# Patient Record
Sex: Female | Born: 1940 | Race: White | Hispanic: No | Marital: Married | Smoking: Never smoker
Health system: Southern US, Community
[De-identification: ages and names within clinical notes are randomized; demographics above are authoritative.]

## PROBLEM LIST (undated history)

## (undated) DIAGNOSIS — Z8719 Personal history of other diseases of the digestive system: Secondary | ICD-10-CM

## (undated) DIAGNOSIS — K219 Gastro-esophageal reflux disease without esophagitis: Secondary | ICD-10-CM

## (undated) DIAGNOSIS — C801 Malignant (primary) neoplasm, unspecified: Secondary | ICD-10-CM

## (undated) DIAGNOSIS — N63 Unspecified lump in unspecified breast: Secondary | ICD-10-CM

## (undated) DIAGNOSIS — I2699 Other pulmonary embolism without acute cor pulmonale: Secondary | ICD-10-CM

## (undated) DIAGNOSIS — G629 Polyneuropathy, unspecified: Secondary | ICD-10-CM

## (undated) DIAGNOSIS — N644 Mastodynia: Secondary | ICD-10-CM

## (undated) HISTORY — PX: COLONOSCOPY W/ POLYPECTOMY: SHX1380

## (undated) HISTORY — PX: TONSILLECTOMY: SUR1361

## (undated) HISTORY — PX: BREAST SURGERY: SHX581

## (undated) HISTORY — PX: MASTECTOMY: SHX3

## (undated) HISTORY — DX: Unspecified lump in unspecified breast: N63.0

## (undated) HISTORY — DX: Mastodynia: N64.4

---

## 2000-05-25 ENCOUNTER — Encounter: Payer: Self-pay | Admitting: Emergency Medicine

## 2000-05-25 ENCOUNTER — Emergency Department (HOSPITAL_COMMUNITY): Admission: EM | Admit: 2000-05-25 | Discharge: 2000-05-25 | Payer: Self-pay | Admitting: Emergency Medicine

## 2002-10-18 ENCOUNTER — Ambulatory Visit (HOSPITAL_COMMUNITY): Admission: RE | Admit: 2002-10-18 | Discharge: 2002-10-18 | Payer: Self-pay | Admitting: Family Medicine

## 2002-10-18 ENCOUNTER — Encounter: Payer: Self-pay | Admitting: Family Medicine

## 2002-10-25 ENCOUNTER — Encounter: Payer: Self-pay | Admitting: Obstetrics and Gynecology

## 2002-10-25 ENCOUNTER — Encounter: Admission: RE | Admit: 2002-10-25 | Discharge: 2002-10-25 | Payer: Self-pay | Admitting: Obstetrics and Gynecology

## 2003-02-20 ENCOUNTER — Emergency Department (HOSPITAL_COMMUNITY): Admission: EM | Admit: 2003-02-20 | Discharge: 2003-02-21 | Payer: Self-pay | Admitting: Emergency Medicine

## 2005-04-03 ENCOUNTER — Emergency Department (HOSPITAL_COMMUNITY): Admission: EM | Admit: 2005-04-03 | Discharge: 2005-04-03 | Payer: Self-pay | Admitting: Family Medicine

## 2009-03-16 HISTORY — PX: SKIN CANCER EXCISION: SHX779

## 2009-03-16 HISTORY — PX: OTHER SURGICAL HISTORY: SHX169

## 2009-10-31 ENCOUNTER — Encounter: Admission: RE | Admit: 2009-10-31 | Discharge: 2009-10-31 | Payer: Self-pay | Admitting: Family Medicine

## 2009-12-13 ENCOUNTER — Other Ambulatory Visit: Admission: RE | Admit: 2009-12-13 | Discharge: 2009-12-13 | Payer: Self-pay | Admitting: Family Medicine

## 2010-07-01 ENCOUNTER — Ambulatory Visit (HOSPITAL_COMMUNITY)
Admission: RE | Admit: 2010-07-01 | Discharge: 2010-07-01 | Disposition: A | Discharge status: Home / Self Care | Payer: Medicare Other | Source: Ambulatory Visit | Attending: Gastroenterology | Admitting: Gastroenterology

## 2010-07-01 ENCOUNTER — Other Ambulatory Visit: Payer: Self-pay | Admitting: Gastroenterology

## 2010-07-01 DIAGNOSIS — K219 Gastro-esophageal reflux disease without esophagitis: Secondary | ICD-10-CM | POA: Insufficient documentation

## 2010-07-01 DIAGNOSIS — K648 Other hemorrhoids: Secondary | ICD-10-CM | POA: Insufficient documentation

## 2010-07-01 DIAGNOSIS — J45909 Unspecified asthma, uncomplicated: Secondary | ICD-10-CM | POA: Insufficient documentation

## 2010-07-01 DIAGNOSIS — D126 Benign neoplasm of colon, unspecified: Secondary | ICD-10-CM | POA: Insufficient documentation

## 2010-07-06 NOTE — Op Note (Signed)
  NAME:  [name] [name], [name] [name] NO.:  [id number]  MEDICAL RECORD NO.:  [id number]           PATIENT TYPE:  O  LOCATION:  [facility]                         FACILITY:  [facility]  PHYSICIAN:  Shirley Friar, MDDATE OF BIRTH:  08/10/1940  DATE OF PROCEDURE: DATE OF DISCHARGE:                              OPERATIVE REPORT   PROCEDURE:  Colonoscopy.  INDICATION:  History of polyps.  The patient had a colonoscopy in December 2011 and a flat polyp was seen in the splenic flexure that was biopsied.  This surveillance colonoscopy is being done to reevaluate the site of this polyp, which was tattooed for further removal and eradication.  MEDICATIONS: 1. Fentanyl 100 mcg IV. 2. Versed 10 mg IV. 3. [place] ink spot 3 cc submucosal injection. 4. Saline injection 6 cc.  FINDINGS:  Rectal exam was unremarkable.  The Pediatric colonoscope was inserted into a well prepped colon and on insertion were several flat polyps noted in the descending colon, splenic flexure, and transverse colon.  On insertion, a 6 mm flat polyp was removed with snare cautery in the descending colon.  The colonoscope was then advanced to the cecum and ileocecal valve and appendiceal orifice were identified.  On careful withdrawal of the colonoscope, a 1.2 cm carpet-like and flat polyp was seen in the distal transverse colon.  6 cc of saline was injected submucosally with attempt to raise the polyp.  It did not raise adequately and snare cautery was not possible due to the flat nature of the polyp.  Two biopsies were taken of the raised polyp with most of the polyp being removed on these biopsies.  A small portion of the rim of the polyp was still present and Argon plasma coagulation was used to fulgurate the remaining part of the polyp.  [place] ink spot was injected near this polyp for tattooing.  A 2 mm flat polyp was seen near this polyp and that was removed with cold biopsy.  Approximately 2 folds distal to  this area was a 4 mm flat polyp that was removed with cold biopsy.  In the splenic flexure, the previously noted flat polyp was again seen correlating with the last colonoscopy and ink was noted from previous tattooing.  Snare cautery was attempted on this flat polyp without success and then the area was fulgurated with Argon plasma coagulation.  On further withdrawal back into the descending colon, a 6 mm flat polyp was seen and removed with snare cautery.  The colonoscope was then further withdrawn and retroflexion was done, which revealed small internal hemorrhoids.  ASSESSMENT: 1. Five flat polyps removed with various techniques as stated in     detail above. 2. Internal hemorrhoids.  FOLLOWUP: 1. Follow up path. 2. No aspirin products for 2 weeks.     Shirley Friar, MD     VCS/MEDQ  D:  07/01/2010  T:  07/01/2010  Job:  [id number]  cc:   Pam Drown, M.D. Fax: [phone]  Electronically Signed by Charlott Rakes MD on 07/06/2010 12:37:06 PM

## 2010-09-29 ENCOUNTER — Other Ambulatory Visit: Payer: Self-pay | Admitting: Family Medicine

## 2010-09-29 DIAGNOSIS — N6489 Other specified disorders of breast: Secondary | ICD-10-CM

## 2010-09-29 DIAGNOSIS — N6310 Unspecified lump in the right breast, unspecified quadrant: Secondary | ICD-10-CM

## 2010-09-29 DIAGNOSIS — Z1231 Encounter for screening mammogram for malignant neoplasm of breast: Secondary | ICD-10-CM

## 2010-10-02 ENCOUNTER — Ambulatory Visit
Admission: RE | Admit: 2010-10-02 | Discharge: 2010-10-02 | Disposition: A | Discharge status: Home / Self Care | Payer: Medicare Other | Source: Ambulatory Visit | Attending: Family Medicine | Admitting: Family Medicine

## 2010-10-02 ENCOUNTER — Other Ambulatory Visit: Payer: Self-pay | Admitting: Family Medicine

## 2010-10-02 DIAGNOSIS — N6310 Unspecified lump in the right breast, unspecified quadrant: Secondary | ICD-10-CM

## 2010-10-07 ENCOUNTER — Other Ambulatory Visit: Payer: Self-pay | Admitting: Family Medicine

## 2010-10-07 ENCOUNTER — Ambulatory Visit
Admission: RE | Admit: 2010-10-07 | Discharge: 2010-10-07 | Disposition: A | Discharge status: Home / Self Care | Payer: Medicare Other | Source: Ambulatory Visit | Attending: Family Medicine | Admitting: Family Medicine

## 2010-10-07 ENCOUNTER — Other Ambulatory Visit: Payer: Self-pay | Admitting: Diagnostic Radiology

## 2010-10-07 DIAGNOSIS — Z1231 Encounter for screening mammogram for malignant neoplasm of breast: Secondary | ICD-10-CM

## 2010-10-07 DIAGNOSIS — N6310 Unspecified lump in the right breast, unspecified quadrant: Secondary | ICD-10-CM

## 2010-10-08 ENCOUNTER — Ambulatory Visit
Admission: RE | Admit: 2010-10-08 | Discharge: 2010-10-08 | Disposition: A | Discharge status: Home / Self Care | Payer: Medicare Other | Source: Ambulatory Visit | Attending: Family Medicine | Admitting: Family Medicine

## 2010-10-08 ENCOUNTER — Other Ambulatory Visit: Payer: Self-pay | Admitting: Family Medicine

## 2010-10-08 DIAGNOSIS — N6489 Other specified disorders of breast: Secondary | ICD-10-CM

## 2010-10-08 DIAGNOSIS — C50911 Malignant neoplasm of unspecified site of right female breast: Secondary | ICD-10-CM

## 2010-10-08 DIAGNOSIS — Z1231 Encounter for screening mammogram for malignant neoplasm of breast: Secondary | ICD-10-CM

## 2010-10-13 ENCOUNTER — Other Ambulatory Visit: Payer: Medicare Other

## 2010-10-15 ENCOUNTER — Other Ambulatory Visit: Payer: Self-pay | Admitting: Oncology

## 2010-10-15 ENCOUNTER — Encounter (HOSPITAL_BASED_OUTPATIENT_CLINIC_OR_DEPARTMENT_OTHER): Payer: Medicare Other | Admitting: Oncology

## 2010-10-15 ENCOUNTER — Ambulatory Visit (HOSPITAL_BASED_OUTPATIENT_CLINIC_OR_DEPARTMENT_OTHER): Payer: Medicare Other | Admitting: General Surgery

## 2010-10-15 ENCOUNTER — Encounter (INDEPENDENT_AMBULATORY_CARE_PROVIDER_SITE_OTHER): Payer: Self-pay | Admitting: General Surgery

## 2010-10-15 VITALS — BP 118/75 | HR 80 | Temp 98.4°F | Resp 18 | Ht 63.5 in | Wt 183.4 lb

## 2010-10-15 DIAGNOSIS — C50119 Malignant neoplasm of central portion of unspecified female breast: Secondary | ICD-10-CM

## 2010-10-15 DIAGNOSIS — C50919 Malignant neoplasm of unspecified site of unspecified female breast: Secondary | ICD-10-CM

## 2010-10-15 LAB — CBC WITH DIFFERENTIAL/PLATELET
BASO%: 0.4 % (ref 0.0–2.0)
HCT: 38.7 % (ref 34.8–46.6)
HGB: 12.9 g/dL (ref 11.6–15.9)
LYMPH%: 32.1 % (ref 14.0–49.7)
MCH: 29.5 pg (ref 25.1–34.0)
MCHC: 33.4 g/dL (ref 31.5–36.0)
MONO%: 5.9 % (ref 0.0–14.0)
NEUT#: 3.9 10*3/uL (ref 1.5–6.5)
RBC: 4.37 10*6/uL (ref 3.70–5.45)
WBC: 6.4 10*3/uL (ref 3.9–10.3)

## 2010-10-15 LAB — COMPREHENSIVE METABOLIC PANEL
ALT: 7 U/L (ref 0–35)
Alkaline Phosphatase: 95 U/L (ref 39–117)
Creatinine, Ser: 0.49 mg/dL — ABNORMAL LOW (ref 0.50–1.10)
Glucose, Bld: 124 mg/dL — ABNORMAL HIGH (ref 70–99)
Sodium: 141 mEq/L (ref 135–145)
Total Bilirubin: 0.5 mg/dL (ref 0.3–1.2)
Total Protein: 7 g/dL (ref 6.0–8.3)

## 2010-10-15 NOTE — Progress Notes (Addendum)
Subjective:     Patient ID: [name] [name], female   DOB: 17-Sep-1940, 70 y.o.   MRN: [id number]  HPI We are asked to see the patient in consultation by Dr. Anselmo Pickler to evaluate her for a right breast cancer.The patient is a 70 year old white female who has had a mass in the upper portion of her right breast for at least the past year. This was imaged last year and was felt to be benign. During the last year it has continued to grow. She does have some tenderness associated with it. She's had no discharge from her nipple. By ultrasound the mass measured at least 4.1 cm. 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 She denies any fevers or chills. No chest pain or shortness of breath. She does have intermittent diarrhea but she has seen a gastroenterologist for this.  Review of Systems  Constitutional: Negative.   HENT: Negative.   Eyes: Negative.   Respiratory: Negative.   Cardiovascular: Negative.   Gastrointestinal: Positive for diarrhea.  Genitourinary: Negative.   Musculoskeletal: Negative.   Skin: Negative.   Neurological: Negative.   Hematological: Negative.   Psychiatric/Behavioral: Negative.    Past Medical History  Diagnosis Date  . Asthma   . Breast pain in female     throbbing  . Abdominal pain   . Breast lump in female    Past Surgical History  Procedure Date  . Colonoscopy w/ polypectomy 12/11, 4/12  . Tonsillectomy   . Skin cancer excision 2011  . Bone density 2011   Current outpatient prescriptions:beclomethasone (QVAR) 40 MCG/ACT inhaler, Inhale 2 puffs into the lungs 2 (two) times daily.  , Disp: , Rfl: ;  GLUCOSAMINE-CHONDROITIN-MSM PO, Take by mouth daily.  , Disp: , Rfl: ;  levalbuterol (XOPENEX HFA) 45 MCG/ACT inhaler, Inhale 1-2 puffs into the lungs every 6 (six) hours as needed.  , Disp: , Rfl: ;  omeprazole (PRILOSEC) 20 MG capsule, Take 20 mg by mouth daily.  , Disp: , Rfl:   Allergies  Allergen Reactions  . Ibuprofen   .  Penicillins        Objective:   Physical Exam  Constitutional: She is oriented to person, place, and time. She appears well-developed and well-nourished.  HENT:  Head: Normocephalic and atraumatic.  Eyes: Conjunctivae and EOM are normal. Pupils are equal, round, and reactive to light.  Neck: Normal range of motion. Neck supple.  Cardiovascular: Normal rate, regular rhythm, normal heart sounds and intact distal pulses.   Pulmonary/Chest: Effort normal and breath sounds normal.       She has a large central mass in the right breast. It does appear to involve the skin. It does not appear to be tethered to the chest wall. It does feel a little larger than 4 cm.she has no palpable mass of the left breast. No axillary, supraclavicular or cervical lymphadenopathy on either side.  Abdominal: Soft. Bowel sounds are normal.  Musculoskeletal: Normal range of motion.  Neurological: She is oriented to person, place, and time.  Skin: Skin is warm and dry.  Psychiatric: She has a normal mood and affect. Her behavior is normal.       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    Plan:     Because of the size of the tumor I think she would be best served with a mastectomy. Although we don't know the exact type of tumor it is  I would recommend treating it like a breast malignancy in terms of evaluating the lymph nodes. I think she is a good candidate for sentinel node mapping.I have discussed her in detail the risks and benefits the operation as well as some of the technical aspects and she understands and wishes to proceed.

## 2010-10-16 ENCOUNTER — Other Ambulatory Visit (INDEPENDENT_AMBULATORY_CARE_PROVIDER_SITE_OTHER): Payer: Self-pay | Admitting: General Surgery

## 2010-10-16 DIAGNOSIS — C50911 Malignant neoplasm of unspecified site of right female breast: Secondary | ICD-10-CM

## 2010-10-17 ENCOUNTER — Ambulatory Visit (HOSPITAL_COMMUNITY)
Admission: RE | Admit: 2010-10-17 | Discharge: 2010-10-17 | Disposition: A | Discharge status: Home / Self Care | Payer: Medicare Other | Source: Ambulatory Visit | Attending: Oncology | Admitting: Oncology

## 2010-10-17 ENCOUNTER — Ambulatory Visit
Admission: RE | Admit: 2010-10-17 | Discharge: 2010-10-17 | Disposition: A | Discharge status: Home / Self Care | Payer: Medicare Other | Source: Ambulatory Visit | Attending: Family Medicine | Admitting: Family Medicine

## 2010-10-17 ENCOUNTER — Other Ambulatory Visit: Payer: Self-pay | Admitting: Oncology

## 2010-10-17 DIAGNOSIS — C50919 Malignant neoplasm of unspecified site of unspecified female breast: Secondary | ICD-10-CM | POA: Insufficient documentation

## 2010-10-17 DIAGNOSIS — C50911 Malignant neoplasm of unspecified site of right female breast: Secondary | ICD-10-CM

## 2010-10-17 DIAGNOSIS — M47814 Spondylosis without myelopathy or radiculopathy, thoracic region: Secondary | ICD-10-CM | POA: Insufficient documentation

## 2010-10-17 DIAGNOSIS — M954 Acquired deformity of chest and rib: Secondary | ICD-10-CM | POA: Insufficient documentation

## 2010-10-17 MED ORDER — GADOBENATE DIMEGLUMINE 529 MG/ML IV SOLN
17.0000 mL | Freq: Once | INTRAVENOUS | Status: AC | PRN
  Administered 2010-10-17: 17 mL via INTRAVENOUS

## 2010-10-30 ENCOUNTER — Telehealth (INDEPENDENT_AMBULATORY_CARE_PROVIDER_SITE_OTHER): Payer: Self-pay | Admitting: General Surgery

## 2010-11-03 ENCOUNTER — Other Ambulatory Visit (HOSPITAL_COMMUNITY): Payer: Medicare Other

## 2010-11-03 ENCOUNTER — Ambulatory Visit: Payer: Medicare Other

## 2010-11-12 ENCOUNTER — Other Ambulatory Visit (HOSPITAL_COMMUNITY): Payer: Medicare Other

## 2010-11-12 ENCOUNTER — Ambulatory Visit (HOSPITAL_COMMUNITY): Admission: RE | Admit: 2010-11-12 | Payer: Medicare Other | Source: Ambulatory Visit | Admitting: General Surgery

## 2011-02-28 ENCOUNTER — Other Ambulatory Visit: Payer: Self-pay

## 2011-02-28 ENCOUNTER — Emergency Department (HOSPITAL_COMMUNITY)
Admission: EM | Admit: 2011-02-28 | Discharge: 2011-03-01 | Disposition: A | Discharge status: Home / Self Care | Payer: Medicare Other | Attending: Emergency Medicine | Admitting: Emergency Medicine

## 2011-02-28 ENCOUNTER — Encounter (HOSPITAL_COMMUNITY): Payer: Self-pay | Admitting: Emergency Medicine

## 2011-02-28 DIAGNOSIS — C50919 Malignant neoplasm of unspecified site of unspecified female breast: Secondary | ICD-10-CM | POA: Insufficient documentation

## 2011-02-28 DIAGNOSIS — E860 Dehydration: Secondary | ICD-10-CM | POA: Insufficient documentation

## 2011-02-28 DIAGNOSIS — R55 Syncope and collapse: Secondary | ICD-10-CM

## 2011-02-28 DIAGNOSIS — E876 Hypokalemia: Secondary | ICD-10-CM | POA: Insufficient documentation

## 2011-02-28 DIAGNOSIS — D649 Anemia, unspecified: Secondary | ICD-10-CM | POA: Insufficient documentation

## 2011-02-28 DIAGNOSIS — Z79899 Other long term (current) drug therapy: Secondary | ICD-10-CM | POA: Insufficient documentation

## 2011-02-28 HISTORY — DX: Malignant (primary) neoplasm, unspecified: C80.1

## 2011-02-28 LAB — COMPREHENSIVE METABOLIC PANEL WITH GFR
ALT: 10 U/L (ref 0–35)
AST: 21 U/L (ref 0–37)
Albumin: 3.2 g/dL — ABNORMAL LOW (ref 3.5–5.2)
Alkaline Phosphatase: 125 U/L — ABNORMAL HIGH (ref 39–117)
BUN: 7 mg/dL (ref 6–23)
CO2: 29 meq/L (ref 19–32)
Calcium: 8.7 mg/dL (ref 8.4–10.5)
Chloride: 98 meq/L (ref 96–112)
Creatinine, Ser: 0.42 mg/dL — ABNORMAL LOW (ref 0.50–1.10)
GFR calc Af Amer: >90 mL/min
GFR calc non Af Amer: >90 mL/min
Glucose, Bld: 102 mg/dL — ABNORMAL HIGH (ref 70–99)
Potassium: 3.2 meq/L — ABNORMAL LOW (ref 3.5–5.1)
Sodium: 135 meq/L (ref 135–145)
Total Bilirubin: 0.5 mg/dL (ref 0.3–1.2)
Total Protein: 5.8 g/dL — ABNORMAL LOW (ref 6.0–8.3)

## 2011-02-28 LAB — CBC
HCT: 25.6 % — ABNORMAL LOW (ref 36.0–46.0)
Hemoglobin: 8.9 g/dL — ABNORMAL LOW (ref 12.0–15.0)
MCH: 30.4 pg (ref 26.0–34.0)
MCHC: 34.8 g/dL (ref 30.0–36.0)
MCV: 87.4 fL (ref 78.0–100.0)
Platelets: 206 K/uL (ref 150–400)
RBC: 2.93 MIL/uL — ABNORMAL LOW (ref 3.87–5.11)
RDW: 15.6 % — ABNORMAL HIGH (ref 11.5–15.5)
WBC: 30.1 K/uL — ABNORMAL HIGH (ref 4.0–10.5)

## 2011-02-28 LAB — DIFFERENTIAL
Basophils Absolute: 0.1 10*3/uL (ref 0.0–0.1)
Eosinophils Relative: 0 % (ref 0–5)
Lymphocytes Relative: 1 % — ABNORMAL LOW (ref 12–46)
Lymphs Abs: 0.4 10*3/uL — ABNORMAL LOW (ref 0.7–4.0)
Monocytes Absolute: 0.3 10*3/uL (ref 0.1–1.0)
Monocytes Relative: 1 % — ABNORMAL LOW (ref 3–12)

## 2011-02-28 LAB — POCT I-STAT, CHEM 8
Calcium, Ion: 1.1 mmol/L — ABNORMAL LOW (ref 1.12–1.32)
Glucose, Bld: 101 mg/dL — ABNORMAL HIGH (ref 70–99)
HCT: 27 % — ABNORMAL LOW (ref 36.0–46.0)
TCO2: 30 mmol/L (ref 0–100)

## 2011-02-28 LAB — POCT I-STAT TROPONIN I: Troponin i, poc: 0.02 ng/mL (ref 0.00–0.08)

## 2011-02-28 MED ORDER — POTASSIUM CHLORIDE ER 10 MEQ PO TBCR: 10.0000 meq | EXTENDED_RELEASE_TABLET | Freq: Two times a day (BID) | ORAL | Status: DC

## 2011-02-28 MED ORDER — SODIUM CHLORIDE 0.9 % IV BOLUS (SEPSIS)
1000.0000 mL | Freq: Once | INTRAVENOUS | Status: AC
  Administered 2011-02-28: 1000 mL via INTRAVENOUS

## 2011-02-28 MED ORDER — SODIUM CHLORIDE 0.9 % IV SOLN
INTRAVENOUS | Status: DC
  Administered 2011-02-28 – 2011-03-01 (×2): via INTRAVENOUS

## 2011-02-28 MED ORDER — PROMETHAZINE HCL 25 MG PO TABS: 25.0000 mg | ORAL_TABLET | Freq: Four times a day (QID) | ORAL | Status: AC | PRN

## 2011-02-28 MED ORDER — POTASSIUM CHLORIDE CRYS ER 20 MEQ PO TBCR
40.0000 meq | EXTENDED_RELEASE_TABLET | Freq: Once | ORAL | Status: AC
  Administered 2011-02-28: 40 meq via ORAL
  Filled 2011-02-28: qty 2

## 2011-02-28 MED ORDER — ACETAMINOPHEN 500 MG PO TABS
1000.0000 mg | ORAL_TABLET | Freq: Once | ORAL | Status: AC
  Administered 2011-02-28: 1000 mg via ORAL
  Filled 2011-02-28: qty 2

## 2011-02-28 MED ORDER — ONDANSETRON HCL 4 MG/2ML IJ SOLN
4.0000 mg | Freq: Once | INTRAMUSCULAR | Status: AC
  Administered 2011-02-28: 4 mg via INTRAVENOUS
  Filled 2011-02-28: qty 2

## 2011-02-28 NOTE — ED Provider Notes (Signed)
History     CSN: [id number] Arrival date & time: 02/28/2011  4:05 PM   First MD Initiated Contact with Patient 02/28/11 1627      Chief Complaint  Patient presents with  . Emesis    x2 today. started new chemo drug on thursday  . Near Syncope    (Consider location/radiation/quality/duration/timing/severity/associated sxs/prior treatment) HPI  Patient relates she has been diagnosed with breast cancer and she just had her fifth round of chemotherapy on December 12. She states that was the first day she received Taxol and she received Neulasta the following day which she's had before. She states last night she started having some myalgias and fatigue and having numbness in her hands. She states her hands got very red. She also had some diffuse abdominal discomfort and loss of appetite. She states her husband gave her activated charcoal and she felt better this morning she felt achy, she denies any chills. She states this morning her hands were intensely red and burning but they're better now. She had some low back discomfort and pain in her hips. She states about 10 AM she took some acetaminophen and that seemed to help.  She relates when she got up this morning she felt tired and she went to the kitchen to cook some aches however she started feeling her body was throbbing and she felt like she would faint. She relates she went back to her bedroom and sat down and her daughter was with her. She states she started feeling lightheaded like she passed out and she did have some loss of consciousness. Her daughter kept her sitting up and called her husband and when he came in the room she woke up and vomited. He states she was pale and sweaty. He states she was a little groggy but she her loss of consciousness was less than a minute. He states he kept her sitting up and she passed out again and vomited again when she came out of that. She had about 3 episodes of vomiting. She relates she's passed out in  the past when she was having chiropractic work done for back problems. She relates she's feeling much improved now. She gets her chemotherapy at [facility] with Dr. Wilford Grist and when she was seen on the 12th  her potassium was 3.1, her hemoglobin was 9.0 and her WBC count was 10,000.  Primary care [facility] Oncologist Dr Derl Barrow  Past Medical History  Diagnosis Date  . Asthma   . Breast pain in female     throbbing  . Abdominal pain   . Breast lump in female   . Cancer     rt    Past Surgical History  Procedure Date  . Colonoscopy w/ polypectomy 12/11, 4/12  . Tonsillectomy   . Skin cancer excision 2011  . Bone density 2011  . Mastectomy     right july 2012    No family history on file. Mother patient had atrial fibrillation  History  Substance Use Topics  . Smoking status: Never Smoker   . Smokeless tobacco: Not on file  . Alcohol Use: No   lives with husband   OB History    Grav Para Term Preterm Abortions TAB SAB Ect Mult Living                  Review of Systems  All other systems reviewed and are negative.    Allergies  Ibuprofen; Other; Penicillins; and Phenobarbital  Home Medications  Current Outpatient Rx  Name Route Sig Dispense Refill  . BECLOMETHASONE DIPROPIONATE 40 MCG/ACT IN AERS Inhalation Inhale 2 puffs into the lungs 2 (two) times daily.      Marland Kitchen DEXAMETHASONE 4 MG PO TABS Oral Take 20 mg by mouth 2 (two) times daily with a meal.      . OMEPRAZOLE 20 MG PO CPDR Oral Take 20 mg by mouth daily.      Marland Kitchen ONDANSETRON HCL 4 MG PO TABS Oral Take 4 mg by mouth every 8 (eight) hours as needed. nausea    . RANITIDINE HCL 150 MG PO TABS Oral Take 150 mg by mouth at bedtime.       BP 110/53  Pulse 100  Temp(Src) 98.7 F (37.1 C) (Oral)  Resp 18  SpO2 100% Vital signs show borderline tachycardia otherwise normal  Physical Exam  Nursing note and vitals reviewed. Constitutional: She is oriented to person, place, and time. She appears  well-developed and well-nourished.  Non-toxic appearance. She does not appear ill. No distress.       Patient's noted to have no tears on her scalp, she does have eyebrow hair  HENT:  Head: Normocephalic and atraumatic.  Right Ear: External ear normal.  Left Ear: External ear normal.  Nose: Nose normal. No mucosal edema or rhinorrhea.  Mouth/Throat: Mucous membranes are normal. No dental abscesses or uvula swelling.       Mucous membranes are dry  Eyes: Conjunctivae and EOM are normal. Pupils are equal, round, and reactive to light.  Neck: Normal range of motion and full passive range of motion without pain. Neck supple.  Cardiovascular: Normal rate, regular rhythm and normal heart sounds.  Exam reveals no gallop and no friction rub.   No murmur heard. Pulmonary/Chest: Effort normal and breath sounds normal. No respiratory distress. She has no wheezes. She has no rhonchi. She has no rales. She exhibits no tenderness and no crepitus.  Abdominal: Soft. Normal appearance and bowel sounds are normal. She exhibits no distension. There is no tenderness. There is no rebound and no guarding.  Musculoskeletal: Normal range of motion. She exhibits no edema and no tenderness.       Moves all extremities well.   Neurological: She is alert and oriented to person, place, and time. She has normal strength. No cranial nerve deficit.  Skin: Skin is warm, dry and intact. No rash noted. No erythema. There is pallor.       Patient's mildly pale  Psychiatric: She has a normal mood and affect. Her speech is normal and behavior is normal. Her mood appears not anxious.    ED Course  Procedures (including critical care time)  1900 IV team just accessed her port, BP 86 systolic just starting to get her fluid bolus. Asking for acetaminophen for her body aches.  Pt given I liter of IV fluids. States she feels better. Pt given oral potassium for her low potassium. She asked for acetaminophen for body aches.    Orthostatics done after first liter, felt dizzy on sitting with BP of 98 systolic, will receive second liter of IV fluids.  After second liter of IV fluids blood pressure still dropped to 96 systolic when she sat up. We'll give third liter of IV fluids 00:45 patient complaining of low back pain from her Neulasta injection. It is been 5 hours since her last dose of Tylenol. It will be repeated. I decided to repeat her i-STAT 8 since she's getting her third liter of  IV fluids to make sure her Hb remains stable.   Results for orders placed during the hospital encounter of 02/28/11  CBC      Component Value Range   WBC 30.1 (*) 4.0 - 10.5 (K/uL)   RBC 2.93 (*) 3.87 - 5.11 (MIL/uL)   Hemoglobin 8.9 (*) 12.0 - 15.0 (g/dL)   HCT 16.1 (*) 09.6 - 46.0 (%)   MCV 87.4  78.0 - 100.0 (fL)   MCH 30.4  26.0 - 34.0 (pg)   MCHC 34.8  30.0 - 36.0 (g/dL)   RDW 04.5 (*) 40.9 - 15.5 (%)   Platelets 206  150 - 400 (K/uL)  DIFFERENTIAL      Component Value Range   Neutrophils Relative 97 (*) 43 - 77 (%)   Neutro Abs 29.3 (*) 1.7 - 7.7 (K/uL)   Lymphocytes Relative 1 (*) 12 - 46 (%)   Lymphs Abs 0.4 (*) 0.7 - 4.0 (K/uL)   Monocytes Relative 1 (*) 3 - 12 (%)   Monocytes Absolute 0.3  0.1 - 1.0 (K/uL)   Eosinophils Relative 0  0 - 5 (%)   Eosinophils Absolute 0.1  0.0 - 0.7 (K/uL)   Basophils Relative 0  0 - 1 (%)   Basophils Absolute 0.1  0.0 - 0.1 (K/uL)   WBC Morphology TOXIC GRANULATION     RBC Morphology POLYCHROMASIA PRESENT     Smear Review LARGE PLATELETS PRESENT    COMPREHENSIVE METABOLIC PANEL      Component Value Range   Sodium 135  135 - 145 (mEq/L)   Potassium 3.2 (*) 3.5 - 5.1 (mEq/L)   Chloride 98  96 - 112 (mEq/L)   CO2 29  19 - 32 (mEq/L)   Glucose, Bld 102 (*) 70 - 99 (mg/dL)   BUN 7  6 - 23 (mg/dL)   Creatinine, Ser 8.11 (*) 0.50 - 1.10 (mg/dL)   Calcium 8.7  8.4 - 91.4 (mg/dL)   Total Protein 5.8 (*) 6.0 - 8.3 (g/dL)   Albumin 3.2 (*) 3.5 - 5.2 (g/dL)   AST 21  0 - 37 (U/L)    ALT 10  0 - 35 (U/L)   Alkaline Phosphatase 125 (*) 39 - 117 (U/L)   Total Bilirubin 0.5  0.3 - 1.2 (mg/dL)   GFR calc non Af Amer >90  >90 (mL/min)   GFR calc Af Amer >90  >90 (mL/min)  POCT I-STAT, CHEM 8      Component Value Range   Sodium 138  135 - 145 (mEq/L)   Potassium 3.3 (*) 3.5 - 5.1 (mEq/L)   Chloride 99  96 - 112 (mEq/L)   BUN 5 (*) 6 - 23 (mg/dL)   Creatinine, Ser 7.82  0.50 - 1.10 (mg/dL)   Glucose, Bld 956 (*) 70 - 99 (mg/dL)   Calcium, Ion 2.13 (*) 1.12 - 1.32 (mmol/L)   TCO2 30  0 - 100 (mmol/L)   Hemoglobin 9.2 (*) 12.0 - 15.0 (g/dL)   HCT 08.6 (*) 57.8 - 46.0 (%)  POCT I-STAT TROPONIN I      Component Value Range   Troponin i, poc 0.02  0.00 - 0.08 (ng/mL)   Comment 3             Date: 02/28/2011  Rate: 93  Rhythm: normal sinus rhythm  QRS Axis: normal  Intervals: normal  ST/T Wave abnormalities: normal  Conduction Disutrbances:RVH, RAE  Narrative Interpretation:   Old EKG Reviewed: none available    Discharge diagnosis  1. Breast cancer   2. Syncope   3. Dehydration   4. Hypokalemia   5. Anemia    New Prescriptions   POTASSIUM CHLORIDE (K-DUR) 10 MEQ TABLET    Take 1 tablet (10 mEq total) by mouth 2 (two) times daily.   PROMETHAZINE (PHENERGAN) 25 MG TABLET    Take 1 tablet (25 mg total) by mouth every 6 (six) hours as needed for nausea.       Plan discharge pending Dr Fleet Contras reviewing second istat 8   MDM          Ward Givens, MD 03/01/11 [phone]

## 2011-02-28 NOTE — ED Notes (Signed)
Pt started new chemo drug on Thursday. Today has had emesis x 2 pta. Positive for  Near syncope, dizziness

## 2011-02-28 NOTE — ED Notes (Signed)
MD at bedside. 

## 2011-03-01 LAB — POCT I-STAT, CHEM 8
BUN: 6 mg/dL (ref 6–23)
Chloride: 103 mEq/L (ref 96–112)
Creatinine, Ser: 0.5 mg/dL (ref 0.50–1.10)
Potassium: 3.6 mEq/L (ref 3.5–5.1)
Sodium: 141 mEq/L (ref 135–145)

## 2011-03-01 MED ORDER — ACETAMINOPHEN 325 MG PO TABS
650.0000 mg | ORAL_TABLET | Freq: Once | ORAL | Status: AC
  Administered 2011-03-01: 650 mg via ORAL
  Filled 2011-03-01: qty 2

## 2011-03-01 MED ORDER — SODIUM CHLORIDE 0.9 % IV BOLUS (SEPSIS)
1000.0000 mL | Freq: Once | INTRAVENOUS | Status: AC
  Administered 2011-03-01: 1000 mL via INTRAVENOUS

## 2011-03-01 NOTE — ED Notes (Signed)
Rx given to pt. 

## 2011-03-01 NOTE — ED Notes (Signed)
MD at bedside. 

## 2011-10-16 ENCOUNTER — Other Ambulatory Visit: Payer: Self-pay | Admitting: Gastroenterology

## 2013-06-19 ENCOUNTER — Encounter (HOSPITAL_COMMUNITY): Payer: Self-pay | Admitting: Emergency Medicine

## 2013-06-19 ENCOUNTER — Emergency Department (HOSPITAL_COMMUNITY): Payer: Medicare Other

## 2013-06-19 ENCOUNTER — Emergency Department (HOSPITAL_COMMUNITY)
Admission: EM | Admit: 2013-06-19 | Discharge: 2013-06-19 | Disposition: A | Discharge status: Home / Self Care | Payer: Medicare Other | Attending: Emergency Medicine | Admitting: Emergency Medicine

## 2013-06-19 DIAGNOSIS — R52 Pain, unspecified: Secondary | ICD-10-CM

## 2013-06-19 DIAGNOSIS — R918 Other nonspecific abnormal finding of lung field: Secondary | ICD-10-CM

## 2013-06-19 DIAGNOSIS — R0602 Shortness of breath: Secondary | ICD-10-CM | POA: Insufficient documentation

## 2013-06-19 DIAGNOSIS — R609 Edema, unspecified: Secondary | ICD-10-CM | POA: Insufficient documentation

## 2013-06-19 DIAGNOSIS — Z880 Allergy status to penicillin: Secondary | ICD-10-CM | POA: Insufficient documentation

## 2013-06-19 DIAGNOSIS — R911 Solitary pulmonary nodule: Secondary | ICD-10-CM | POA: Insufficient documentation

## 2013-06-19 DIAGNOSIS — J3489 Other specified disorders of nose and nasal sinuses: Secondary | ICD-10-CM | POA: Insufficient documentation

## 2013-06-19 DIAGNOSIS — J45909 Unspecified asthma, uncomplicated: Secondary | ICD-10-CM | POA: Insufficient documentation

## 2013-06-19 DIAGNOSIS — IMO0002 Reserved for concepts with insufficient information to code with codable children: Secondary | ICD-10-CM | POA: Insufficient documentation

## 2013-06-19 DIAGNOSIS — C50919 Malignant neoplasm of unspecified site of unspecified female breast: Secondary | ICD-10-CM | POA: Insufficient documentation

## 2013-06-19 LAB — CBC WITH DIFFERENTIAL/PLATELET
BASOS ABS: 0 10*3/uL (ref 0.0–0.1)
BASOS PCT: 0 % (ref 0–1)
Eosinophils Absolute: 0.1 10*3/uL (ref 0.0–0.7)
Eosinophils Relative: 2 % (ref 0–5)
HCT: 38.7 % (ref 36.0–46.0)
Hemoglobin: 12.8 g/dL (ref 12.0–15.0)
Lymphocytes Relative: 27 % (ref 12–46)
Lymphs Abs: 2 10*3/uL (ref 0.7–4.0)
MCH: 30 pg (ref 26.0–34.0)
MCHC: 33.1 g/dL (ref 30.0–36.0)
MCV: 90.8 fL (ref 78.0–100.0)
Monocytes Absolute: 0.7 10*3/uL (ref 0.1–1.0)
Monocytes Relative: 10 % (ref 3–12)
NEUTROS ABS: 4.6 10*3/uL (ref 1.7–7.7)
NEUTROS PCT: 62 % (ref 43–77)
Platelets: 288 10*3/uL (ref 150–400)
RBC: 4.26 MIL/uL (ref 3.87–5.11)
RDW: 13 % (ref 11.5–15.5)
WBC: 7.5 10*3/uL (ref 4.0–10.5)

## 2013-06-19 LAB — D-DIMER, QUANTITATIVE (NOT AT ARMC): D DIMER QUANT: 0.96 ug{FEU}/mL — AB (ref 0.00–0.48)

## 2013-06-19 LAB — BASIC METABOLIC PANEL
BUN: 14 mg/dL (ref 6–23)
CHLORIDE: 101 meq/L (ref 96–112)
CO2: 27 mEq/L (ref 19–32)
CREATININE: 0.46 mg/dL — AB (ref 0.50–1.10)
Calcium: 9.8 mg/dL (ref 8.4–10.5)
Glucose, Bld: 101 mg/dL — ABNORMAL HIGH (ref 70–99)
POTASSIUM: 3.5 meq/L — AB (ref 3.7–5.3)
Sodium: 141 mEq/L (ref 137–147)

## 2013-06-19 LAB — TROPONIN I: Troponin I: <0.3 ng/mL (ref <0.30)

## 2013-06-19 MED ORDER — SODIUM CHLORIDE 0.9 % IV BOLUS (SEPSIS)
500.0000 mL | Freq: Once | INTRAVENOUS | Status: AC
  Administered 2013-06-19: 500 mL via INTRAVENOUS

## 2013-06-19 MED ORDER — IOHEXOL 350 MG/ML SOLN
100.0000 mL | Freq: Once | INTRAVENOUS | Status: AC | PRN
  Administered 2013-06-19: 100 mL via INTRAVENOUS

## 2013-06-19 NOTE — ED Notes (Signed)
Pt reports that her pain increases with deep inspiration but that now she no longer has the shoulder pain and the lower left back pain is decreasing.

## 2013-06-19 NOTE — ED Notes (Signed)
Patient waiting to complete IV fluid bolus prior to leaving. Jarrett Soho, [place], requests patient not leave until fluid is completed.

## 2013-06-19 NOTE — ED Provider Notes (Signed)
CSN: [id number]     Arrival date & time 06/19/13  5573 History   First MD Initiated Contact with Patient 06/19/13 [phone]     Chief Complaint  Patient presents with  . Back Pain  . Shoulder Pain     (Consider location/radiation/quality/duration/timing/severity/associated sxs/prior Treatment) HPI Comments: Patient is a 73 year old female with history of asthma, breast cancer who presents today with left-sided pain. The pain began around 2 AM and persisted for a few hours. It was a pleuritic type pain, worse with inspiration. She had no tenderness to palpation during this episode. She had associated shortness of breath. She denies any nausea, vomiting, diaphoresis. The symptoms resolved spontaneously. She feels well currently. She is currently in remission from her breast cancer and has an appointment to see her oncologist at [facility] in 2 days. She recently has had rhinorrhea and postnasal drip. She reports that her cough has ceased since the pain began. She denies any new leg swelling. She chronically has bilateral leg swelling.   The history is provided by the patient. No language interpreter was used.    Past Medical History  Diagnosis Date  . Asthma   . Breast pain in female     throbbing  . Abdominal pain   . Breast lump in female   . Cancer     rt   Past Surgical History  Procedure Laterality Date  . Colonoscopy w/ polypectomy  12/11, 4/12  . Tonsillectomy    . Skin cancer excision  2011  . Bone density  2011  . Mastectomy      right july 2012   History reviewed. No pertinent family history. History  Substance Use Topics  . Smoking status: Never Smoker   . Smokeless tobacco: Never Used  . Alcohol Use: No   OB History   Grav Para Term Preterm Abortions TAB SAB Ect Mult Living                 Review of Systems  Constitutional: Negative for fever, chills and diaphoresis.  HENT: Positive for congestion and rhinorrhea.   Respiratory: Positive for shortness of breath.  Negative for cough.   Cardiovascular: Positive for chest pain.  Gastrointestinal: Negative for nausea, vomiting and abdominal pain.  All other systems reviewed and are negative.      Allergies  Penicillins; Ibuprofen; Other; and Phenobarbital  Home Medications   Current Outpatient Rx  Name  Route  Sig  Dispense  Refill  . beclomethasone (QVAR) 40 MCG/ACT inhaler   Inhalation   Inhale 2 puffs into the lungs 2 (two) times daily.          . Glucosamine HCl-MSM 1500-500 MG/30ML LIQD   Oral   Take 30 mLs by mouth daily.         . hydroxypropyl methylcellulose (ISOPTO TEARS) 2.5 % ophthalmic solution   Both Eyes   Place 1 drop into both eyes as needed for dry eyes.         . Multiple Vitamin (MULTIVITAMIN WITH MINERALS) TABS tablet   Oral   Take 1 tablet by mouth daily.         Marland Kitchen EXPIRED: potassium chloride (K-DUR) 10 MEQ tablet   Oral   Take 1 tablet (10 mEq total) by mouth 2 (two) times daily.   8 tablet   0    BP 127/65  Pulse 88  Temp(Src) 98.5 F (36.9 C) (Oral)  Resp 16  Ht 5' 2.75" (1.594 m)  Wt 180 lb (  81.647 kg)  BMI 32.13 kg/m2  SpO2 98% Physical Exam  Nursing note and vitals reviewed. Constitutional: She is oriented to person, place, and time. She appears well-developed and well-nourished. No distress.  HENT:  Head: Normocephalic and atraumatic.  Right Ear: External ear normal.  Left Ear: External ear normal.  Nose: Nose normal.  Mouth/Throat: Oropharynx is clear and moist.  Eyes: Conjunctivae are normal.  Neck: Normal range of motion.  Cardiovascular: Normal rate, regular rhythm, normal heart sounds, intact distal pulses and normal pulses.   Pulses:      Radial pulses are 2+ on the right side, and 2+ on the left side.       Posterior tibial pulses are 2+ on the right side, and 2+ on the left side.  Mild edema to lower extremities bilaterally. No calf tenderness.   Pulmonary/Chest: Effort normal and breath sounds normal. No stridor. No  respiratory distress. She has no wheezes. She has no rales.  No tenderness to palpation of her chest wall  Abdominal: Soft. She exhibits no distension.  Musculoskeletal: Normal range of motion.  Full range of motion of left shoulder.  Neurological: She is alert and oriented to person, place, and time. She has normal strength.  Skin: Skin is warm and dry. She is not diaphoretic. No erythema.  Psychiatric: She has a normal mood and affect. Her behavior is normal.    ED Course  Procedures (including critical care time) Labs Review Labs Reviewed  BASIC METABOLIC PANEL - Abnormal; Notable for the following:    Potassium 3.5 (*)    Glucose, Bld 101 (*)    Creatinine, Ser 0.46 (*)    All other components within normal limits  D-DIMER, QUANTITATIVE - Abnormal; Notable for the following:    D-Dimer, Quant 0.96 (*)    All other components within normal limits  CBC WITH DIFFERENTIAL  TROPONIN I   Imaging Review Dg Chest 2 View  06/19/2013   CLINICAL DATA:  Shortness breath and left posterior chest pain. Cough. History of asthma.  EXAM: CHEST  2 VIEW  COMPARISON:  Chest radiograph performed 10/17/2010  FINDINGS: The lungs are well-aerated. A 1.0 cm nodule is noted at the right midlung zone, new from the prior study. There is no evidence of pleural effusion or pneumothorax.  The heart is normal in size; the mediastinal contour is within normal limits. No acute osseous abnormalities are seen.  IMPRESSION: 1. 1.0 cm right mid lung zone nodule, new from the prior study. CT of the chest would be helpful for further evaluation, when and as deemed clinically appropriate. 2. No acute cardiopulmonary process seen.   Electronically Signed   By: Garald Balding M.D.   On: 06/19/2013 06:52   Ct Angio Chest Pe W/cm &/or Wo Cm  06/19/2013   CLINICAL DATA:  Pleuritic chest pain  EXAM: CT ANGIOGRAPHY CHEST WITH CONTRAST  TECHNIQUE: Multidetector CT imaging of the chest was performed using the standard protocol during  bolus administration of intravenous contrast. Multiplanar CT image reconstructions and MIPs were obtained to evaluate the vascular anatomy.  CONTRAST:  160mL OMNIPAQUE IOHEXOL 350 MG/ML SOLN  COMPARISON:  Chest radiograph June 19, 2013  FINDINGS: There is no demonstrable pulmonary embolus. There is no thoracic aortic aneurysm or dissection.  On axillary slice 40, series 7, there is a 1.2 x 0.7 cm nodular opacity with slightly irregular borders in the inferior aspect of the anterior segment of the right upper lobe. On axial slice 26, series 7, there  is a nodular areas surrounding 80 peripheral bronchus measuring 1.0 x 0.9 cm. On axial slice 35, there is a 0.6 x 0.6 cm nodular opacity in the superior segment left lower lobe. On axial slice 59, there is a nodular opacity abutting the pleura in the posterior segment of the left lower lobe medially measuring 1.0 x 1.0 cm. There is a nodular opacity in the lateral segment of the right lower lobe on slice 66 measuring 0.9 x 0.7 cm. There is mild subsegmental atelectasis in the lung bases. There is no edema or consolidation. There is a small left pleural effusion. There are a few small bullae in the right lower lobe.  There is no appreciable thoracic adenopathy. The pericardium is not thickened. There is a small hiatal hernia.  Visualized upper abdominal structures appear normal. There are no blastic or lytic bone lesions. Visualized thyroid appears normal.  Review of the MIP images confirms the above findings.  IMPRESSION: There are several nodular opacities in the lungs as described, largest in the anterior segment right upper lobe. This appearance does raise concern for potential metastatic disease. This finding may warrant nuclear medicine and a PET study or possibly tissue sampling of one of the nodular lesions to further assess.  No adenopathy. No airspace consolidation. There is a small left pleural effusion. There is mild underlying emphysematous change. There is a  small hiatal hernia.  No demonstrable pulmonary embolus.   Electronically Signed   By: Lowella Grip M.D.   On: 06/19/2013 08:35     EKG Interpretation   Date/Time:  Monday June 19 2013 07:47:34 EDT Ventricular Rate:  74 PR Interval:  127 QRS Duration: 95 QT Interval:  413 QTC Calculation: 458 R Axis:   63 Text Interpretation:  Sinus rhythm Probable left atrial enlargement RSR'  in V1 or V2, probably normal variant early r wave transition on prior ECG  not present on current ECG Confirmed by KNAPP  MD-J, JON ([id number]) on  06/19/2013 10:04:38 AM      MDM   Final diagnoses:  Lung nodules  Pain of left side of body    Patient presents to ED for a pleuritic left sided pain which has since resolved. Her d dimer was positive and a CT chest was done. No PE. CT did find several nodular opacities concerning for metastatic disease. Discussed this with the patient and the fact that she may need nuclear medicine and a PET study or possibly tissue sampling of a nodular lesion. She expresses understanding and will discuss this with her oncologist in 2 days. Discussed reasons to return to the ED immediately. Vital signs stable for discharge. Discussed case with Dr. Tomi Bamberger who agrees with plan. Patient / Family / Caregiver informed of clinical course, understand medical decision-making process, and agree with plan.     Elwyn Lade, PA-C 06/19/13 1010

## 2013-06-19 NOTE — ED Notes (Signed)
Pt BIB EMS, reports that she awoke this morning at 0200 with lower back pain and L shoulder pain. Pt sleeps on a water bed and states that she feels like she has a knot in her back. Upon standing pt states pain decreases. Pt a&o x4, ambulatory to triage. NAD noted at this time

## 2013-06-19 NOTE — ED Notes (Signed)
Patient transported to CT 

## 2013-06-19 NOTE — Discharge Instructions (Signed)
Please follow up with your oncologist at scheduled appointment. You may need a nuclear medicine and a PET study or possibly tissue sampling of one of the nodular lesions. Please return to the ED if you have new or worsening symptoms.   Pulmonary Nodule A pulmonary nodule is a small, round growth of tissue in the lung. Pulmonary nodules can range in size from less than 1/5 inch (4 mm) to a little bigger than an inch (25 mm). Most pulmonary nodules are detected when imaging tests of the lung are being performed for a different problem. Pulmonary nodules are usually not cancerous (benign). However, some pulmonary nodules are cancerous (malignant). Follow-up treatment or testing is based on the size of the pulmonary nodule and your risk of getting lung cancer.  CAUSES Benign pulmonary nodules can be caused by various things. Some of the causes include:   Bacterial, fungal, or viral infections. This is usually an old infection that is no longer active, but it can sometimes be a current, active infection.  A benign mass of tissue.  Inflammation from conditions such as rheumatoid arthritis.   Abnormal blood vessels in the lungs. Malignant pulmonary nodules can result from lung cancer or from cancers that spread to the lung from other places in the body. SIGNS AND SYMPTOMS Pulmonary nodules usually do not cause symptoms. DIAGNOSIS Most often, pulmonary nodules are found incidentally when an X-ray or CT scan is performed to look for some other problem in the lung area. To help determine whether a pulmonary nodule is benign or malignant, your health care provider will take a medical history and order a variety of tests. Tests done may include:   Blood tests.  A skin test called a tuberculin test. This test is used to determine if you have been exposed to the germ that causes tuberculosis.   Chest X-rays. If possible, a new X-ray may be compared with X-rays you have had in the past.   CT scan.  This test shows smaller pulmonary nodules more clearly than an X-ray.   Positron emission tomography (PET) scan. In this test, a safe amount of a radioactive substance is injected into the bloodstream. Then, the scan takes a picture of the pulmonary nodule. The radioactive substance is eliminated from your body in your urine.   Biopsy. A tiny piece of the pulmonary nodule is removed so it can be checked under a microscope. TREATMENT  Pulmonary nodules that are benign normally do not require any treatment because they usually do not cause symptoms or breathing problems. Your health care provider may want to monitor the pulmonary nodule through follow-up CT scans. The frequency of these CT scans will vary based on the size of the nodule and the risk factors for lung cancer. For example, CT scans will need to be done more frequently if the pulmonary nodule is larger and if you have a history of smoking and a family history of cancer. Further testing or biopsies may be done if any follow-up CT scan shows that the size of the pulmonary nodule has increased. HOME CARE INSTRUCTIONS  Only take over-the-counter or prescription medicines as directed by your health care provider.  Keep all follow-up appointments with your health care provider. SEEK MEDICAL CARE IF:  You have trouble breathing when you are active.   You feel sick or unusually tired.   You do not feel like eating.   You lose weight without trying to.   You develop chills or night sweats.  SEEK  IMMEDIATE MEDICAL CARE IF:  You cannot catch your breath, or you begin wheezing.   You cannot stop coughing.   You cough up blood.   You become dizzy or feel like you are going to pass out.   You have sudden chest pain.   You have a fever or persistent symptoms for more than 2 3 days.   You have a fever and your symptoms suddenly get worse. MAKE SURE YOU:  Understand these instructions.  Will watch your  condition.  Will get help right away if you are not doing well or get worse. Document Released: 12/28/2008 Document Revised: 11/02/2012 Document Reviewed: 08/22/2012 [facility] Patient Information 2014 [place].

## 2013-06-20 NOTE — ED Provider Notes (Signed)
Medical screening examination/treatment/procedure(s) were performed by non-physician practitioner and as supervising physician I was immediately available for consultation/collaboration.   EKG Interpretation   Date/Time:  Monday June 19 2013 07:47:34 EDT Ventricular Rate:  74 PR Interval:  127 QRS Duration: 95 QT Interval:  413 QTC Calculation: 458 R Axis:   63 Text Interpretation:  Sinus rhythm Probable left atrial enlargement RSR'  in V1 or V2, probably normal variant early r wave transition on prior ECG  not present on current ECG Confirmed by KNAPP  MD-J, JON ([id number]) on  06/19/2013 10:04:38 AM       Varney Biles, MD 06/20/13 9611

## 2013-08-14 HISTORY — PX: LUNG BIOPSY: SHX232

## 2013-12-29 ENCOUNTER — Other Ambulatory Visit: Payer: Self-pay

## 2014-03-25 ENCOUNTER — Emergency Department (HOSPITAL_COMMUNITY): Payer: Medicare Other

## 2014-03-25 ENCOUNTER — Other Ambulatory Visit (HOSPITAL_COMMUNITY): Payer: Medicare Other

## 2014-03-25 ENCOUNTER — Inpatient Hospital Stay (HOSPITAL_COMMUNITY)
Admission: EM | Admit: 2014-03-25 | Discharge: 2014-03-27 | DRG: 176 | Disposition: A | Discharge status: Home / Self Care | Payer: Medicare Other | Attending: Internal Medicine | Admitting: Internal Medicine

## 2014-03-25 ENCOUNTER — Encounter (HOSPITAL_COMMUNITY): Payer: Self-pay | Admitting: *Deleted

## 2014-03-25 DIAGNOSIS — C7800 Secondary malignant neoplasm of unspecified lung: Secondary | ICD-10-CM | POA: Diagnosis present

## 2014-03-25 DIAGNOSIS — R0602 Shortness of breath: Secondary | ICD-10-CM

## 2014-03-25 DIAGNOSIS — E876 Hypokalemia: Secondary | ICD-10-CM

## 2014-03-25 DIAGNOSIS — R071 Chest pain on breathing: Secondary | ICD-10-CM

## 2014-03-25 DIAGNOSIS — C50919 Malignant neoplasm of unspecified site of unspecified female breast: Secondary | ICD-10-CM | POA: Diagnosis present

## 2014-03-25 DIAGNOSIS — Z86711 Personal history of pulmonary embolism: Secondary | ICD-10-CM | POA: Diagnosis present

## 2014-03-25 DIAGNOSIS — I2699 Other pulmonary embolism without acute cor pulmonale: Principal | ICD-10-CM | POA: Diagnosis present

## 2014-03-25 DIAGNOSIS — M79609 Pain in unspecified limb: Secondary | ICD-10-CM

## 2014-03-25 DIAGNOSIS — Z9221 Personal history of antineoplastic chemotherapy: Secondary | ICD-10-CM

## 2014-03-25 DIAGNOSIS — N390 Urinary tract infection, site not specified: Secondary | ICD-10-CM

## 2014-03-25 DIAGNOSIS — Z9011 Acquired absence of right breast and nipple: Secondary | ICD-10-CM | POA: Diagnosis present

## 2014-03-25 DIAGNOSIS — J45909 Unspecified asthma, uncomplicated: Secondary | ICD-10-CM

## 2014-03-25 HISTORY — DX: Personal history of other diseases of the digestive system: Z87.19

## 2014-03-25 HISTORY — DX: Polyneuropathy, unspecified: G62.9

## 2014-03-25 HISTORY — DX: Other pulmonary embolism without acute cor pulmonale: I26.99

## 2014-03-25 HISTORY — DX: Gastro-esophageal reflux disease without esophagitis: K21.9

## 2014-03-25 LAB — BASIC METABOLIC PANEL
ANION GAP: 10 (ref 5–15)
BUN: 9 mg/dL (ref 6–23)
CALCIUM: 8.9 mg/dL (ref 8.4–10.5)
CO2: 26 mmol/L (ref 19–32)
CREATININE: 0.37 mg/dL — AB (ref 0.50–1.10)
Chloride: 103 mEq/L (ref 96–112)
GLUCOSE: 110 mg/dL — AB (ref 70–99)
POTASSIUM: 3.2 mmol/L — AB (ref 3.5–5.1)
SODIUM: 139 mmol/L (ref 135–145)

## 2014-03-25 LAB — CBC WITH DIFFERENTIAL/PLATELET
Basophils Absolute: 0 10*3/uL (ref 0.0–0.1)
Basophils Relative: 1 % (ref 0–1)
EOS ABS: 0.1 10*3/uL (ref 0.0–0.7)
EOS PCT: 1 % (ref 0–5)
HCT: 32.7 % — ABNORMAL LOW (ref 36.0–46.0)
HEMOGLOBIN: 10.4 g/dL — AB (ref 12.0–15.0)
Lymphocytes Relative: 25 % (ref 12–46)
Lymphs Abs: 1.8 10*3/uL (ref 0.7–4.0)
MCH: 27.7 pg (ref 26.0–34.0)
MCHC: 31.8 g/dL (ref 30.0–36.0)
MCV: 87 fL (ref 78.0–100.0)
MONO ABS: 0.5 10*3/uL (ref 0.1–1.0)
MONOS PCT: 7 % (ref 3–12)
NEUTROS PCT: 66 % (ref 43–77)
Neutro Abs: 4.6 10*3/uL (ref 1.7–7.7)
PLATELETS: 343 10*3/uL (ref 150–400)
RBC: 3.76 MIL/uL — ABNORMAL LOW (ref 3.87–5.11)
RDW: 16 % — ABNORMAL HIGH (ref 11.5–15.5)
WBC: 7 10*3/uL (ref 4.0–10.5)

## 2014-03-25 LAB — I-STAT TROPONIN, ED: Troponin i, poc: 0 ng/mL (ref 0.00–0.08)

## 2014-03-25 LAB — PROTIME-INR
INR: 1.05 (ref 0.00–1.49)
Prothrombin Time: 13.8 seconds (ref 11.6–15.2)

## 2014-03-25 MED ORDER — ACETAMINOPHEN 325 MG PO TABS: 650.0000 mg | ORAL_TABLET | Freq: Four times a day (QID) | ORAL | Status: DC | PRN

## 2014-03-25 MED ORDER — SODIUM CHLORIDE 0.9 % IV SOLN
INTRAVENOUS | Status: DC
  Administered 2014-03-25 – 2014-03-26 (×2): via INTRAVENOUS

## 2014-03-25 MED ORDER — ACETAMINOPHEN 650 MG RE SUPP: 650.0000 mg | Freq: Four times a day (QID) | RECTAL | Status: DC | PRN

## 2014-03-25 MED ORDER — HEPARIN BOLUS VIA INFUSION
4000.0000 [IU] | Freq: Once | INTRAVENOUS | Status: AC
  Administered 2014-03-25: 4000 [IU] via INTRAVENOUS
  Filled 2014-03-25: qty 4000

## 2014-03-25 MED ORDER — HEPARIN (PORCINE) IN NACL 100-0.45 UNIT/ML-% IJ SOLN
1300.0000 [IU]/h | INTRAMUSCULAR | Status: AC
  Administered 2014-03-25 – 2014-03-26 (×2): 1350 [IU]/h via INTRAVENOUS
  Administered 2014-03-27: 1300 [IU]/h via INTRAVENOUS
  Filled 2014-03-25 (×4): qty 250

## 2014-03-25 MED ORDER — POTASSIUM CHLORIDE CRYS ER 20 MEQ PO TBCR: 40.0000 meq | EXTENDED_RELEASE_TABLET | Freq: Once | ORAL | Status: DC

## 2014-03-25 MED ORDER — HYDROCODONE-ACETAMINOPHEN 5-325 MG PO TABS
2.0000 | ORAL_TABLET | Freq: Once | ORAL | Status: AC
  Administered 2014-03-25: 1 via ORAL
  Filled 2014-03-25: qty 2

## 2014-03-25 MED ORDER — IOHEXOL 350 MG/ML SOLN
100.0000 mL | Freq: Once | INTRAVENOUS | Status: AC | PRN
  Administered 2014-03-25: 100 mL via INTRAVENOUS

## 2014-03-25 MED ORDER — HYDROCODONE-ACETAMINOPHEN 5-325 MG PO TABS
1.0000 | ORAL_TABLET | ORAL | Status: DC | PRN
  Administered 2014-03-25 – 2014-03-27 (×7): 1 via ORAL
  Filled 2014-03-25 (×7): qty 1

## 2014-03-25 MED ORDER — POTASSIUM CHLORIDE CRYS ER 20 MEQ PO TBCR
40.0000 meq | EXTENDED_RELEASE_TABLET | Freq: Once | ORAL | Status: AC
  Administered 2014-03-25: 40 meq via ORAL
  Filled 2014-03-25: qty 2

## 2014-03-25 NOTE — ED Notes (Signed)
Pt reports right chest pain has decreased to 5/10, shortness of breath has improved.  Pt able to take deep breaths since pain has decreased.  No distress noted at this time.

## 2014-03-25 NOTE — ED Provider Notes (Signed)
Medical screening examination/treatment/procedure(s) were conducted as a shared visit with non-physician practitioner(s) and myself.  I personally evaluated the patient during the encounter.   The patient presents to the emergency room with complaints of chest pain associated with a cough. 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 2 evenings ago she was seen at [facility] for a temperature of 102. xray  did not show pneumonia. The patient was then seen today at the [facility] walk-in clinic. She was sent to the emergency room to evaluate for pulmonary embolism. Physical Exam  BP 128/68 mmHg  Pulse 94  Temp(Src) 98.2 F (36.8 C) (Oral)  Resp 20  SpO2 96%  Physical Exam  Constitutional: She appears well-developed and well-nourished. No distress.  HENT:  Head: Normocephalic and atraumatic.  Right Ear: External ear normal.  Left Ear: External ear normal.  Eyes: Conjunctivae are normal. Right eye exhibits no discharge. Left eye exhibits no discharge. No scleral icterus.  Neck: Neck supple. No tracheal deviation present.  Cardiovascular: Normal rate.   Pulmonary/Chest: Effort normal. No stridor. No respiratory distress.  Musculoskeletal: She exhibits no edema.  Neurological: She is alert. Cranial nerve deficit: no gross deficits.  Skin: Skin is warm and dry. No rash noted.  Psychiatric: She has a normal mood and affect.  Nursing note and vitals reviewed.   ED Course  Procedures    EKG Interpretation Date/Time:  Sunday March 25 2014 11:15:07 EST Ventricular Rate:  99 PR Interval:  116 QRS Duration: 82 QT Interval:  340 QTC Calculation: 436 R Axis:   49 Text Interpretation:  Normal sinus rhythm Nonspecific ST and T wave  abnormality , new since last tracing Abnormal ECG Confirmed by Smokey Melott   MD-J, Eren Puebla ([id number]) on 03/25/2014 11:34:22 AM      MDM The patient has a PowerPort. Plan is to proceed with CT imaging of the chest after recess her renal function. Patient  also has some leg swelling and will get a Doppler study of her lower extremity.  Chest CT shows PE.  Plan on admission, initiate anticoagulation.      Dorie Rank, MD 03/26/14 1524

## 2014-03-25 NOTE — Progress Notes (Signed)
VASCULAR LAB PRELIMINARY  PRELIMINARY  PRELIMINARY  PRELIMINARY  Left lower extremity venous Doppler completed.    Preliminary report:  There is no DVT or SVT noted in the left lower extremity.   Duwane Gewirtz, RVT 03/25/2014, 2:42 PM

## 2014-03-25 NOTE — H&P (Signed)
Patient Demographics  [name] [name], is a 74 y.o. female  MRN: [id number]   DOB - May 11, 1940  Admit Date - 03/25/2014  Outpatient Primary MD for the patient is MCNEILL,WENDY, MD   With History of -  Past Medical History  Diagnosis Date  . Asthma   . Breast pain in female     throbbing  . Abdominal pain   . Breast lump in female   . Cancer     rt      Past Surgical History  Procedure Laterality Date  . Colonoscopy w/ polypectomy  12/11, 4/12  . Tonsillectomy    . Skin cancer excision  2011  . Bone density  2011  . Mastectomy      right july 2012  . Breast surgery      Right Mastectomy    in for   Chief Complaint  Patient presents with  . Shortness of Breath  . Chest Pain     HPI  [name] [name]  is a 74 y.o. female, with known history of metastatic breast cancer to the chest, on chemotherapy at [facility], asthma, presents with complaints of shortness of breath and chest pain, reports symptoms of sudden onset from sleep this morning, chest pain pleuritic, worsened by deep inspiration, not tachycardic, not hypoxic, troponins are negative, EKG nonacute, CT chest angiogram is positive for multiple tiny subsegmental sized pulmonary emboli in the right middle and right lower lobe, no large central lobar or segmental size emboli, CT chest showing as well progressive metastatic disease of the lung, lower extremity venous Doppler was negative for DVT,  patient was started on heparin GTT in ED, and hospitalist requested to admit. Patient reports she had fever last week, where she went at [facility], told she has UTI, and was started on nitrofurantoin.    Review of Systems    In addition to the HPI above,  No Fever-chills, No Headache, No changes with Vision or hearing, No problems swallowing food or Liquids, Complains of Pleuritic Chest pain, and Shortness of Breath, No Abdominal pain, No Nausea or Vommitting, Bowel movements are regular, No Blood in stool or  Urine, No dysuria, No new skin rashes or bruises, No new joints pains-aches,  No new weakness, tingling, numbness in any extremity, No recent weight gain or loss, No polyuria, polydypsia or polyphagia, No significant Mental Stressors.  A full 10 point Review of Systems was done, except as stated above, all other Review of Systems were negative.   Social History History  Substance Use Topics  . Smoking status: Never Smoker   . Smokeless tobacco: Never Used  . Alcohol Use: No     Family History No family history on file.   Prior to Admission medications   Medication Sig Start Date End Date Taking? Authorizing Provider  amitriptyline (ELAVIL) 25 MG tablet Take 1 tablet by mouth at bedtime. 03/21/14 03/21/15 Yes Historical Provider, MD  beclomethasone (QVAR) 40 MCG/ACT inhaler Inhale 2 puffs into the lungs 2 (two) times daily.    Yes Historical Provider, MD  Calcium Carbonate-Vitamin D (CALCIUM + D PO) Take 2 tablets by mouth daily.   Yes Historical Provider, MD  Glucosamine HCl-MSM 1500-500 MG/30ML LIQD Take 30 mLs by mouth daily.   Yes Historical Provider, MD  hydroxypropyl methylcellulose (ISOPTO TEARS) 2.5 % ophthalmic solution Place 1 drop into both eyes as needed for dry eyes.   Yes Historical Provider, MD  ipratropium (ATROVENT HFA) 17 MCG/ACT inhaler Inhale 2 puffs into  the lungs 4 (four) times daily as needed. 02/28/14 02/28/15 Yes Historical Provider, MD  Multiple Vitamin (MULTIVITAMIN WITH MINERALS) TABS tablet Take 1 tablet by mouth daily.   Yes Historical Provider, MD  nitrofurantoin, macrocrystal-monohydrate, (MACROBID) 100 MG capsule Take 100 mg by mouth 2 (two) times daily.   Yes Historical Provider, MD  potassium chloride SA (K-DUR,KLOR-CON) 20 MEQ tablet Take 20 mEq by mouth 2 (two) times daily.   Yes Historical Provider, MD  ranitidine (ZANTAC) 150 MG tablet Take 150 mg by mouth 2 (two) times daily.   Yes Historical Provider, MD  potassium chloride (K-DUR) 10 MEQ  tablet Take 1 tablet (10 mEq total) by mouth 2 (two) times daily. 02/28/11 02/28/12  Janice Norrie, MD    Allergies  Allergen Reactions  . Penicillins Other (See Comments)    Childhood reaction  . Adhesive [Tape]   . Albuterol   . Codeine   . Ibuprofen Other (See Comments)    Induces patient asthma  . Meperidine     Other reaction(s): Other (See Comments) "wiped out" and nausea  . Other     Unknown narcotic  . Phenobarbital Other (See Comments)    Erratic behavior    Physical Exam  Vitals  Blood pressure 123/57, pulse 86, temperature 98.2 F (36.8 C), temperature source Oral, resp. rate 20, SpO2 96 %.   1. General elderly female lying in bed in NAD,    2. Normal affect and insight, Not Suicidal or Homicidal, Awake Alert, Oriented X 3.  3. No F.N deficits, ALL C.Nerves Intact, Strength 5/5 all 4 extremities, Sensation intact all 4 extremities, Plantars down going.  4. Ears and Eyes appear Normal, Conjunctivae clear, PERRLA. Moist Oral Mucosa.  5. Supple Neck, No JVD, No cervical lymphadenopathy appriciated, No Carotid Bruits.  6. Symmetrical Chest wall movement, Good air movement bilaterally, no wheezing.  7. RRR, No Gallops, Rubs or Murmurs, No Parasternal Heave.  8. Positive Bowel Sounds, Abdomen Soft, No tenderness, No organomegaly appriciated,No rebound -guarding or rigidity.  9.  No Cyanosis, Normal Skin Turgor, No Skin Rash or Bruise.  10. Good muscle tone,  joints appear normal , no effusions, Normal ROM.    Data Review  CBC  Recent Labs Lab 03/25/14 1300  WBC 7.0  HGB 10.4*  HCT 32.7*  PLT 343  MCV 87.0  MCH 27.7  MCHC 31.8  RDW 16.0*  LYMPHSABS 1.8  MONOABS 0.5  EOSABS 0.1  BASOSABS 0.0   ------------------------------------------------------------------------------------------------------------------  Chemistries   Recent Labs Lab 03/25/14 1300  NA 139  K 3.2*  CL 103  CO2 26  GLUCOSE 110*  BUN 9  CREATININE 0.37*  CALCIUM  8.9   ------------------------------------------------------------------------------------------------------------------ CrCl cannot be calculated (Unknown ideal weight.). ------------------------------------------------------------------------------------------------------------------ No results for input(s): TSH, T4TOTAL, T3FREE, THYROIDAB in the last 72 hours.  Invalid input(s): FREET3   Coagulation profile  Recent Labs Lab 03/25/14 1300  INR 1.05   ------------------------------------------------------------------------------------------------------------------- No results for input(s): DDIMER in the last 72 hours. -------------------------------------------------------------------------------------------------------------------  Cardiac Enzymes No results for input(s): CKMB, TROPONINI, MYOGLOBIN in the last 168 hours.  Invalid input(s): CK ------------------------------------------------------------------------------------------------------------------ Invalid input(s): POCBNP   ---------------------------------------------------------------------------------------------------------------  Urinalysis No results found for: COLORURINE, APPEARANCEUR, LABSPEC, [place], GLUCOSEU, HGBUR, BILIRUBINUR, KETONESUR, PROTEINUR, UROBILINOGEN, NITRITE, LEUKOCYTESUR  ----------------------------------------------------------------------------------------------------------------  Imaging results:   Dg Chest 2 View  03/25/2014   CLINICAL DATA:  Right-sided chest pain and difficulty breathing. Status post mastectomy for breast carcinoma  EXAM: CHEST  2 VIEW  COMPARISON:  Chest CT  and chest radiograph June 19, 2013  FINDINGS: There are multiple nodular opacities in the right lung consistent with metastatic foci. These opacities have increased compared to prior studies. There is scarring in the left base. There is no frank airspace consolidation. Heart size and pulmonary vascularity are  normal. No adenopathy. Port-A-Cath tip is at the cavoatrial junction. No pneumothorax. Patient is status post right mastectomy. There are no blastic or lytic bone lesions.  IMPRESSION: Pulmonary nodular opacities on the right consistent with metastatic foci, increased. Scarring left base. No airspace consolidation. Status post right mastectomy.   Electronically Signed   By: Lowella Grip M.D.   On: 03/25/2014 14:01   Ct Angio Chest Pe W/cm &/or Wo Cm  03/25/2014   CLINICAL DATA:  74 year old female with history breast cancer with metastatic disease. Evaluate for pulmonary embolism.  EXAM: CT ANGIOGRAPHY CHEST WITH CONTRAST  TECHNIQUE: Multidetector CT imaging of the chest was performed using the standard protocol during bolus administration of intravenous contrast. Multiplanar CT image reconstructions and MIPs were obtained to evaluate the vascular anatomy.  CONTRAST:  163mL OMNIPAQUE IOHEXOL 350 MG/ML SOLN  COMPARISON:  Chest CT 06/19/2013.  FINDINGS: Mediastinum/Lymph Nodes: There are several subsegmental sized filling defects within the pulmonary arterial tree in the right lung, involving right middle and right lower lobe pulmonary artery branches. No larger segmental, lobar or central filling defect is noted. Heart size is borderline enlarged. There is no significant pericardial fluid, thickening or pericardial calcification. Left internal jugular single-lumen power porta cath with tip terminating in the right atrium. Multiple borderline enlarged mediastinal and hilar lymph nodes are noted, but are nonspecific. No definite enlarged mediastinal, hilar, internal mammary or axillary lymph nodes. Esophagus is unremarkable in appearance.  Lungs/Pleura: Compared to the prior study, there has been an interval increase in the number and size of numerous pulmonary nodules throughout the lungs bilaterally, compatible with progressive metastatic disease. The largest of these is a partially cavitary nodule in the  right upper lobe (image 33 of series 408), which currently measures 2.1 x 1.6 cm (previously 9 x 10 mm). The largest new nodule is an ovoid shaped lesion in the inferior segment of the lingula measuring 2.2 x 1.2 cm (image 66 of series 408). Trace right pleural effusion layering dependently. There is significant pleural thickening and nodularity in the left hemithorax, most notably in the left posterior costophrenic sulcus, presumably indicative of pleural spread of disease. Small amount of subpleural reticulation in the periphery of the right upper and middle lobes, presumably post radiation changes. No consolidative airspace disease.  Musculoskeletal/Soft Tissues: Status post right modified radical mastectomy. No soft tissue mass along the right chest wall to suggest local recurrence of disease. There are no aggressive appearing lytic or blastic lesions noted in the visualized portions of the skeleton.  Upper Abdomen: Unremarkable.  Review of the MIP images confirms the above findings.  IMPRESSION: 1. Study is positive for multiple tiny subsegmental sized pulmonary emboli in the right middle and right lower lobe. No larger central, lobar or segmental sized emboli are noted. 2. Progressive metastatic disease to the lungs and pleura, as detailed above, significantly increased compared to prior study 06/19/2013. 3. Additional incidental findings, as above.   Electronically Signed   By: Vinnie Langton M.D.   On: 03/25/2014 15:53    My personal review of EKG: Rhythm NSR, Rate   99 /min, QTc436  , nonspecific ST changes    Assessment & Plan  Principal Problem:  Pulmonary embolism Active Problems:   Breast cancer   Chest pain   Asthma   Hypokalemia   UTI (lower urinary tract infection)    Pulmonary embolism - Agent will be admitted to telemetry, will be started on heparin drip, as is stable, will transition to Xarelto in the morning, where she can be discharged on Xarelto.  Chest pain -  Pleuritic, most likely related to pulmonary embolism, monitor overnight on telemetry, cycle cardiac enzymes.  Breast cancer - Patient is following with [facility] oncology, and chemotherapy.  Asthma - No active wheezing, continue with when necessary ipratropium.  Hypokalemia - Continue to replace, recheck in a.m..  UTI - Patient was diagnosed with last Friday on [facility], will need to continue her nitrofurantoin total of 7 days treatment.    DVT Prophylaxis on heparin drip AM Labs Ordered, also please review Full Orders  Family Communication: Admission, patients condition and plan of care including tests being ordered have been discussed with the patient and husband who indicate understanding and agree with the plan and Code Status.  Code Status full  Likely DC to  home  Condition GUARDED    Time spent in minutes : 55 minutes    Moussa Wiegand M.D on 03/25/2014 at 5:18 PM  Between 7am to 7pm - Pager - [phone]  After 7pm go to [email or web address] - password [email or web address]  And look for the night coverage person covering me after hours  [facility] Office  [phone]   **Disclaimer: This note may have been dictated with voice recognition software. Similar sounding words can inadvertently be transcribed and this note may contain transcription errors which may not have been corrected upon publication of note.**

## 2014-03-25 NOTE — ED Notes (Signed)
Seen at [facility] 2 days ago for fever, left flank pain, cough.  Was started on Macrodantin for possible UTI.  Pt started med yesterday.

## 2014-03-25 NOTE — ED Notes (Signed)
Pt is chemo patient for Breast CA that is now in lungs.  Two nights ago seen at [facility] for Temp 102.  Pt now developed sob and chest pain this am.  Sent here from [facility] walk in clinic to r/o PE.

## 2014-03-25 NOTE — ED Provider Notes (Signed)
CSN: [id number]     Arrival date & time 03/25/14  1110 History   First MD Initiated Contact with Patient 03/25/14 1133     Chief Complaint  Patient presents with  . Shortness of Breath  . Chest Pain     (Consider location/radiation/quality/duration/timing/severity/associated sxs/prior Treatment) HPI  [name] [name] is a 74 y.o. female with PMH of asthma, breast cancer with metastases to the lung status post mastectomy currently undergoing chemotherapy presenting with chest pain and shortness of breath that started while at rest today at 7am. Pain has been persistent. Worse with deep breathing and ambulation. She has not taken anything for the pain. Never had pain like this before. She was evaluated by [facility] physicians today and sent for PE rule out. Pt denies nausea vomiting diaphoresis. No cardiac history. She denies prior history of PE, DVT, recent surgery or trauma, hemoptysis, swelling or tenderness in her calves. Pt seen in ED a [facility] for temp 102 and persistent cough with thick sputum. No PNA and pt treated for UTI with macrobid.    Past Medical History  Diagnosis Date  . Asthma   . Breast pain in female     throbbing  . Abdominal pain   . Breast lump in female   . Cancer     rt   Past Surgical History  Procedure Laterality Date  . Colonoscopy w/ polypectomy  12/11, 4/12  . Tonsillectomy    . Skin cancer excision  2011  . Bone density  2011  . Mastectomy      right july 2012  . Breast surgery      Right Mastectomy   No family history on file. History  Substance Use Topics  . Smoking status: Never Smoker   . Smokeless tobacco: Never Used  . Alcohol Use: No   OB History    No data available     Review of Systems 10 Systems reviewed and are negative for acute change except as noted in the HPI.    Allergies  Penicillins; Adhesive; Albuterol; Codeine; Ibuprofen; Meperidine; Other; and Phenobarbital  Home Medications   Prior to Admission medications    Medication Sig Start Date End Date Taking? Authorizing Provider  amitriptyline (ELAVIL) 25 MG tablet Take 1 tablet by mouth at bedtime. 03/21/14 03/21/15 Yes Historical Provider, MD  beclomethasone (QVAR) 40 MCG/ACT inhaler Inhale 2 puffs into the lungs 2 (two) times daily.    Yes Historical Provider, MD  Calcium Carbonate-Vitamin D (CALCIUM + D PO) Take 2 tablets by mouth daily.   Yes Historical Provider, MD  Glucosamine HCl-MSM 1500-500 MG/30ML LIQD Take 30 mLs by mouth daily.   Yes Historical Provider, MD  hydroxypropyl methylcellulose (ISOPTO TEARS) 2.5 % ophthalmic solution Place 1 drop into both eyes as needed for dry eyes.   Yes Historical Provider, MD  ipratropium (ATROVENT HFA) 17 MCG/ACT inhaler Inhale 2 puffs into the lungs 4 (four) times daily as needed. 02/28/14 02/28/15 Yes Historical Provider, MD  Multiple Vitamin (MULTIVITAMIN WITH MINERALS) TABS tablet Take 1 tablet by mouth daily.   Yes Historical Provider, MD  nitrofurantoin, macrocrystal-monohydrate, (MACROBID) 100 MG capsule Take 100 mg by mouth 2 (two) times daily.   Yes Historical Provider, MD  potassium chloride SA (K-DUR,KLOR-CON) 20 MEQ tablet Take 20 mEq by mouth 2 (two) times daily.   Yes Historical Provider, MD  ranitidine (ZANTAC) 150 MG tablet Take 150 mg by mouth 2 (two) times daily.   Yes Historical Provider, MD  potassium chloride (K-DUR)  10 MEQ tablet Take 1 tablet (10 mEq total) by mouth 2 (two) times daily. 02/28/11 02/28/12  Janice Norrie, MD   BP 113/47 mmHg  Pulse 84  Temp(Src) 98.4 F (36.9 C) (Oral)  Resp 18  Ht 5\' 3"  (1.6 m)  Wt 166 lb 0.1 oz (75.3 kg)  BMI 29.41 kg/m2  SpO2 97% Physical Exam  Constitutional: She appears well-developed and well-nourished. No distress.  HENT:  Head: Normocephalic and atraumatic.  Mouth/Throat: Oropharynx is clear and moist.  Eyes: Conjunctivae and EOM are normal. Right eye exhibits no discharge. Left eye exhibits no discharge.  Neck: No JVD present.  Cardiovascular:  Normal rate, regular rhythm and normal heart sounds.   No leg swelling with left calf tenderness. No appreciated size discrepancy. No erythema.   Pulmonary/Chest: Effort normal and breath sounds normal. No respiratory distress. She has no wheezes.  Abdominal: Soft. Bowel sounds are normal. She exhibits no distension. There is no tenderness.  Neurological: She is alert. She exhibits normal muscle tone. Coordination normal.  Skin: Skin is warm and dry. She is not diaphoretic.  Nursing note and vitals reviewed.   ED Course  Procedures (including critical care time) Labs Review Labs Reviewed  CBC WITH DIFFERENTIAL - Abnormal; Notable for the following:    RBC 3.76 (*)    Hemoglobin 10.4 (*)    HCT 32.7 (*)    RDW 16.0 (*)    All other components within normal limits  BASIC METABOLIC PANEL - Abnormal; Notable for the following:    Potassium 3.2 (*)    Glucose, Bld 110 (*)    Creatinine, Ser 0.37 (*)    All other components within normal limits  CBC - Abnormal; Notable for the following:    RBC 3.46 (*)    Hemoglobin 10.0 (*)    HCT 30.8 (*)    RDW 16.3 (*)    All other components within normal limits  HEPARIN LEVEL (UNFRACTIONATED) - Abnormal; Notable for the following:    Heparin Unfractionated 0.79 (*)    All other components within normal limits  BASIC METABOLIC PANEL - Abnormal; Notable for the following:    Creatinine, Ser 0.39 (*)    Calcium 7.9 (*)    Anion gap 4 (*)    All other components within normal limits  CBC - Abnormal; Notable for the following:    RBC 3.37 (*)    Hemoglobin 9.7 (*)    HCT 30.1 (*)    RDW 16.3 (*)    All other components within normal limits  PROTIME-INR  HEPARIN LEVEL (UNFRACTIONATED)  TROPONIN I  TROPONIN I  TROPONIN I  BASIC METABOLIC PANEL  I-STAT TROPOININ, ED    Imaging Review Dg Chest 2 View  03/25/2014   CLINICAL DATA:  Right-sided chest pain and difficulty breathing. Status post mastectomy for breast carcinoma  EXAM: CHEST  2  VIEW  COMPARISON:  Chest CT and chest radiograph June 19, 2013  FINDINGS: There are multiple nodular opacities in the right lung consistent with metastatic foci. These opacities have increased compared to prior studies. There is scarring in the left base. There is no frank airspace consolidation. Heart size and pulmonary vascularity are normal. No adenopathy. Port-A-Cath tip is at the cavoatrial junction. No pneumothorax. Patient is status post right mastectomy. There are no blastic or lytic bone lesions.  IMPRESSION: Pulmonary nodular opacities on the right consistent with metastatic foci, increased. Scarring left base. No airspace consolidation. Status post right mastectomy.   Electronically  Signed   By: Lowella Grip M.D.   On: 03/25/2014 14:01   Ct Angio Chest Pe W/cm &/or Wo Cm  03/25/2014   CLINICAL DATA:  74 year old female with history breast cancer with metastatic disease. Evaluate for pulmonary embolism.  EXAM: CT ANGIOGRAPHY CHEST WITH CONTRAST  TECHNIQUE: Multidetector CT imaging of the chest was performed using the standard protocol during bolus administration of intravenous contrast. Multiplanar CT image reconstructions and MIPs were obtained to evaluate the vascular anatomy.  CONTRAST:  1104mL OMNIPAQUE IOHEXOL 350 MG/ML SOLN  COMPARISON:  Chest CT 06/19/2013.  FINDINGS: Mediastinum/Lymph Nodes: There are several subsegmental sized filling defects within the pulmonary arterial tree in the right lung, involving right middle and right lower lobe pulmonary artery branches. No larger segmental, lobar or central filling defect is noted. Heart size is borderline enlarged. There is no significant pericardial fluid, thickening or pericardial calcification. Left internal jugular single-lumen power porta cath with tip terminating in the right atrium. Multiple borderline enlarged mediastinal and hilar lymph nodes are noted, but are nonspecific. No definite enlarged mediastinal, hilar, internal mammary or  axillary lymph nodes. Esophagus is unremarkable in appearance.  Lungs/Pleura: Compared to the prior study, there has been an interval increase in the number and size of numerous pulmonary nodules throughout the lungs bilaterally, compatible with progressive metastatic disease. The largest of these is a partially cavitary nodule in the right upper lobe (image 33 of series 408), which currently measures 2.1 x 1.6 cm (previously 9 x 10 mm). The largest new nodule is an ovoid shaped lesion in the inferior segment of the lingula measuring 2.2 x 1.2 cm (image 66 of series 408). Trace right pleural effusion layering dependently. There is significant pleural thickening and nodularity in the left hemithorax, most notably in the left posterior costophrenic sulcus, presumably indicative of pleural spread of disease. Small amount of subpleural reticulation in the periphery of the right upper and middle lobes, presumably post radiation changes. No consolidative airspace disease.  Musculoskeletal/Soft Tissues: Status post right modified radical mastectomy. No soft tissue mass along the right chest wall to suggest local recurrence of disease. There are no aggressive appearing lytic or blastic lesions noted in the visualized portions of the skeleton.  Upper Abdomen: Unremarkable.  Review of the MIP images confirms the above findings.  IMPRESSION: 1. Study is positive for multiple tiny subsegmental sized pulmonary emboli in the right middle and right lower lobe. No larger central, lobar or segmental sized emboli are noted. 2. Progressive metastatic disease to the lungs and pleura, as detailed above, significantly increased compared to prior study 06/19/2013. 3. Additional incidental findings, as above.   Electronically Signed   By: Vinnie Langton M.D.   On: 03/25/2014 15:53     EKG Interpretation   Date/Time:  Sunday March 25 2014 11:15:07 EST Ventricular Rate:  99 PR Interval:  116 QRS Duration: 82 QT Interval:   340 QTC Calculation: 436 R Axis:   49 Text Interpretation:  Normal sinus rhythm Nonspecific ST and T wave  abnormality , new since last tracing Abnormal ECG Confirmed by KNAPP   MD-J, JON ([id number]) on 03/25/2014 11:34:22 AM      MDM   Final diagnoses:  Shortness of breath  Pulmonary embolism   Pt with one-day history of shortness of breath and pleuritic chest pain. Patient is breast cancer patient who is currently undergoing chemotherapy. She was sent here by her primary care provider with current concerns of PE. Patient is afebrile with no tachycardia  or hypoxia. Lungs clear to auscultation bilaterally and patient with left calf tenderness without appreciated unilateral swelling. Left venous duplex and CTA ordered for PE and DVT r/o. Pt without evidence of DVT and with multiple small PE in right middle and lower lobes. Pt with normal kidney function and coags. Plan to start heparin per pharmacy recommendations. Consult to hospitalist for admission. Spoke with Dr. Landis Gandy who agrees with admission.  Discussed all results and patient verbalizes understanding and agrees with plan.  This is a shared patient. This patient was discussed with the physician, Dr. Tomi Bamberger who saw and evaluated the patient and agrees with the plan.   Pura Spice, PA-C 03/26/14 1624  Dorie Rank, MD 03/26/14 [id number]

## 2014-03-25 NOTE — Progress Notes (Signed)
ANTICOAGULATION CONSULT NOTE - Initial Consult  Pharmacy Consult for Heparin Indication: pulmonary embolus  Allergies  Allergen Reactions  . Penicillins Other (See Comments)    Childhood reaction  . Adhesive [Tape]   . Albuterol   . Codeine   . Ibuprofen Other (See Comments)    Induces patient asthma  . Meperidine     Other reaction(s): Other (See Comments) "wiped out" and nausea  . Other     Unknown narcotic  . Phenobarbital Other (See Comments)    Erratic behavior    Patient Measurements: Weight = 82 kg  Vital Signs: Temp: 98.2 F (36.8 C) (01/10 1122) Temp Source: Oral (01/10 1122) BP: 123/57 mmHg (01/10 1600) Pulse Rate: 86 (01/10 1600)  Labs:  Recent Labs  03/25/14 1300  HGB 10.4*  HCT 32.7*  PLT 343  LABPROT 13.8  INR 1.05  CREATININE 0.37*    CrCl cannot be calculated (Unknown ideal weight.).   Medical History: Past Medical History  Diagnosis Date  . Asthma   . Breast pain in female     throbbing  . Abdominal pain   . Breast lump in female   . Cancer     rt    Assessment: 74 year old female with breast cancer (that is now in the lungs) to  start heparin for a new PE  Goal of Therapy:  Heparin level 0.3-0.7 units/ml Monitor platelets by anticoagulation protocol: Yes   Plan:  Heparin 4000 units iv bolus x 1 Heparin drip at 1350 units / hr Heparin level 6 hours after heparin starts Daily heparin level, CBC  Thank you. Anette Guarneri, PharmD [phone]  03/25/2014,4:46 PM

## 2014-03-26 ENCOUNTER — Encounter (HOSPITAL_COMMUNITY): Payer: Self-pay | Admitting: General Practice

## 2014-03-26 DIAGNOSIS — R0602 Shortness of breath: Secondary | ICD-10-CM | POA: Diagnosis present

## 2014-03-26 DIAGNOSIS — I2699 Other pulmonary embolism without acute cor pulmonale: Secondary | ICD-10-CM

## 2014-03-26 DIAGNOSIS — E876 Hypokalemia: Secondary | ICD-10-CM | POA: Diagnosis present

## 2014-03-26 DIAGNOSIS — C50919 Malignant neoplasm of unspecified site of unspecified female breast: Secondary | ICD-10-CM

## 2014-03-26 DIAGNOSIS — N390 Urinary tract infection, site not specified: Secondary | ICD-10-CM | POA: Diagnosis present

## 2014-03-26 DIAGNOSIS — Z9221 Personal history of antineoplastic chemotherapy: Secondary | ICD-10-CM | POA: Diagnosis not present

## 2014-03-26 DIAGNOSIS — Z9011 Acquired absence of right breast and nipple: Secondary | ICD-10-CM | POA: Diagnosis present

## 2014-03-26 DIAGNOSIS — C7800 Secondary malignant neoplasm of unspecified lung: Secondary | ICD-10-CM | POA: Diagnosis present

## 2014-03-26 DIAGNOSIS — J45909 Unspecified asthma, uncomplicated: Secondary | ICD-10-CM | POA: Diagnosis present

## 2014-03-26 HISTORY — DX: Other pulmonary embolism without acute cor pulmonale: I26.99

## 2014-03-26 LAB — CBC
HCT: 30.1 % — ABNORMAL LOW (ref 36.0–46.0)
HEMATOCRIT: 30.8 % — AB (ref 36.0–46.0)
Hemoglobin: 10 g/dL — ABNORMAL LOW (ref 12.0–15.0)
Hemoglobin: 9.7 g/dL — ABNORMAL LOW (ref 12.0–15.0)
MCH: 28.8 pg (ref 26.0–34.0)
MCH: 28.9 pg (ref 26.0–34.0)
MCHC: 32.2 g/dL (ref 30.0–36.0)
MCHC: 32.5 g/dL (ref 30.0–36.0)
MCV: 89 fL (ref 78.0–100.0)
MCV: 89.3 fL (ref 78.0–100.0)
Platelets: 306 10*3/uL (ref 150–400)
Platelets: 319 10*3/uL (ref 150–400)
RBC: 3.37 MIL/uL — AB (ref 3.87–5.11)
RBC: 3.46 MIL/uL — AB (ref 3.87–5.11)
RDW: 16.3 % — ABNORMAL HIGH (ref 11.5–15.5)
RDW: 16.3 % — ABNORMAL HIGH (ref 11.5–15.5)
WBC: 6.1 10*3/uL (ref 4.0–10.5)
WBC: 6.1 10*3/uL (ref 4.0–10.5)

## 2014-03-26 LAB — BASIC METABOLIC PANEL
ANION GAP: 4 — AB (ref 5–15)
BUN: 7 mg/dL (ref 6–23)
CO2: 27 mmol/L (ref 19–32)
Calcium: 7.9 mg/dL — ABNORMAL LOW (ref 8.4–10.5)
Chloride: 106 mEq/L (ref 96–112)
Creatinine, Ser: 0.39 mg/dL — ABNORMAL LOW (ref 0.50–1.10)
GFR calc non Af Amer: >90 mL/min (ref >90)
Glucose, Bld: 97 mg/dL (ref 70–99)
Potassium: 3.6 mmol/L (ref 3.5–5.1)
Sodium: 137 mmol/L (ref 135–145)

## 2014-03-26 LAB — TROPONIN I: Troponin I: <0.03 ng/mL (ref <0.031)

## 2014-03-26 LAB — HEPARIN LEVEL (UNFRACTIONATED)
Heparin Unfractionated: 0.65 IU/mL (ref 0.30–0.70)
Heparin Unfractionated: 0.79 IU/mL — ABNORMAL HIGH (ref 0.30–0.70)

## 2014-03-26 MED ORDER — POTASSIUM CHLORIDE CRYS ER 20 MEQ PO TBCR
20.0000 meq | EXTENDED_RELEASE_TABLET | Freq: Two times a day (BID) | ORAL | Status: DC
  Administered 2014-03-26 – 2014-03-27 (×3): 20 meq via ORAL
  Filled 2014-03-26 (×3): qty 1

## 2014-03-26 MED ORDER — ONDANSETRON HCL 4 MG PO TABS: 4.0000 mg | ORAL_TABLET | Freq: Four times a day (QID) | ORAL | Status: DC | PRN

## 2014-03-26 MED ORDER — SODIUM CHLORIDE 0.9 % IJ SOLN
3.0000 mL | Freq: Two times a day (BID) | INTRAMUSCULAR | Status: DC
  Administered 2014-03-26: 3 mL via INTRAVENOUS
  Administered 2014-03-27: 10 mL via INTRAVENOUS
  Filled 2014-03-26: qty 3

## 2014-03-26 MED ORDER — POLYVINYL ALCOHOL 1.4 % OP SOLN: 1.0000 [drp] | OPHTHALMIC | Status: DC | PRN

## 2014-03-26 MED ORDER — ONDANSETRON HCL 4 MG/2ML IJ SOLN: 4.0000 mg | Freq: Four times a day (QID) | INTRAMUSCULAR | Status: DC | PRN

## 2014-03-26 MED ORDER — NITROFURANTOIN MONOHYD MACRO 100 MG PO CAPS
100.0000 mg | ORAL_CAPSULE | Freq: Two times a day (BID) | ORAL | Status: DC
  Administered 2014-03-26 – 2014-03-27 (×4): 100 mg via ORAL
  Filled 2014-03-26 (×6): qty 1

## 2014-03-26 MED ORDER — IPRATROPIUM BROMIDE 0.02 % IN SOLN: 0.5000 mg | RESPIRATORY_TRACT | Status: DC | PRN

## 2014-03-26 MED ORDER — AMITRIPTYLINE HCL 25 MG PO TABS
25.0000 mg | ORAL_TABLET | Freq: Every day | ORAL | Status: DC
  Administered 2014-03-26: 25 mg via ORAL
  Filled 2014-03-26: qty 1

## 2014-03-26 MED ORDER — FLUTICASONE PROPIONATE HFA 44 MCG/ACT IN AERO
1.0000 | INHALATION_SPRAY | Freq: Two times a day (BID) | RESPIRATORY_TRACT | Status: DC
  Administered 2014-03-26 – 2014-03-27 (×3): 1 via RESPIRATORY_TRACT
  Filled 2014-03-26: qty 10.6

## 2014-03-26 MED ORDER — ADULT MULTIVITAMIN W/MINERALS CH
1.0000 | ORAL_TABLET | Freq: Every day | ORAL | Status: DC
  Administered 2014-03-26 – 2014-03-27 (×2): 1 via ORAL
  Filled 2014-03-26 (×2): qty 1

## 2014-03-26 MED ORDER — ALUM & MAG HYDROXIDE-SIMETH 200-200-20 MG/5ML PO SUSP: 30.0000 mL | Freq: Four times a day (QID) | ORAL | Status: DC | PRN

## 2014-03-26 MED ORDER — FAMOTIDINE 20 MG PO TABS
20.0000 mg | ORAL_TABLET | Freq: Two times a day (BID) | ORAL | Status: DC
  Administered 2014-03-26 – 2014-03-27 (×3): 20 mg via ORAL
  Filled 2014-03-26 (×3): qty 1

## 2014-03-26 MED ORDER — CALCIUM CARBONATE 1250 (500 CA) MG PO TABS
1.0000 | ORAL_TABLET | Freq: Every day | ORAL | Status: DC
  Administered 2014-03-26 – 2014-03-27 (×2): 500 mg via ORAL
  Filled 2014-03-26 (×4): qty 1

## 2014-03-26 NOTE — Progress Notes (Deleted)
Pt transferred to 3W16 from [facility] with Nitro and Heparin drips infusing.  D/C'd per verbal order from Dr. Debara Pickett. Pt to start SQ Lovenox. Jessie Foot, RN

## 2014-03-26 NOTE — Progress Notes (Signed)
ANTICOAGULATION CONSULT NOTE - Follow Up Consult  Pharmacy Consult for Heparin  Indication: pulmonary embolus  Vital Signs: BP: 130/55 mmHg (01/11 0000) Pulse Rate: 98 (01/11 0000)  Labs:  Recent Labs  03/25/14 1300 03/26/14 0028  HGB 10.4* 10.0*  HCT 32.7* 30.8*  PLT 343 319  LABPROT 13.8  --   INR 1.05  --   HEPARINUNFRC  --  0.65  CREATININE 0.37*  --    Assessment: Therapeutic heparin level x 1  Goal of Therapy:  Heparin level 0.3-0.7 units/ml Monitor platelets by anticoagulation protocol: Yes   Plan:  -Continue heparin at 1350 units/hr -0800 HL -Daily CBC/HL -Monitor for bleeding  Narda Bonds 03/26/2014,1:24 AM

## 2014-03-26 NOTE — Progress Notes (Signed)
ANTICOAGULATION CONSULT NOTE - Follow Up Consult  Pharmacy Consult for Heparin  Indication: pulmonary embolus New RML/RLL  Vital Signs: BP: 108/40 mmHg (01/11 0836) Pulse Rate: 79 (01/11 0836)  Labs:  Recent Labs  03/25/14 1300 03/26/14 0028 03/26/14 0250 03/26/14 0740 03/26/14 0746  HGB 10.4* 10.0* 9.7*  --   --   HCT 32.7* 30.8* 30.1*  --   --   PLT 343 319 306  --   --   LABPROT 13.8  --   --   --   --   INR 1.05  --   --   --   --   HEPARINUNFRC  --  0.65  --   --  0.79*  CREATININE 0.37*  --  0.39*  --   --   TROPONINI  --   --  <0.03 <0.03  --    Assessment: 74 year old female with breast cancer with a new RML/RLL PE.  Heparin drip started 1350 uts/hr HL 0.65> 0.79.  CBC stable, no bleeding noted.  Will drop rate slightly to prevent accumulation.     Goal of Therapy:  Heparin level 0.3-0.7 units/ml Monitor platelets by anticoagulation protocol: Yes   Plan:  Decrease heparin drip 130 units/hr -Daily CBC/HL -Monitor for bleeding  Bonnita Nasuti Pharm.D. CPP, BCPS Clinical Pharmacist [phone] 03/26/2014 9:21 AM

## 2014-03-26 NOTE — Progress Notes (Signed)
[name] [name] FBP:[id number] DOB: [birth year] DOA: 03/25/2014 PCP: Cari Caraway, MD  Assessment/Plan: Pulmonary embolism - telemetry -heparin drip -transition to Xarelto in the morning, where she can be discharged on Xarelto- discussed with hematology lovenox vs xarelto- xarelto ok to use -care management consult - left LE duplex negative  Chest pain - Pleuritic, most likely related to pulmonary embolism, monitor overnight on telemetry, cycle cardiac enzymes.  Breast cancer - Patient is following with [facility] oncology, and chemotherapy. -has appointment Wednesday  Asthma - No active wheezing, continue with when necessary ipratropium.  Hypokalemia - Continue to replace, recheck in a.m..  UTI -Patient was diagnosed with last Friday on [facility], will need to continue her nitrofurantoin total of 7 days treatment.  Code Status: full Family Communication: patient and husband Disposition Plan:    Consultants:    Procedures:      HPI/Subjective: No SOB, some pain on right right of chest  Objective: Filed Vitals:   03/26/14 0956  BP: 108/49  Pulse: 85  Temp:   Resp: 16    Intake/Output Summary (Last 24 hours) at 03/26/14 1052 Last data filed at 03/26/14 1004  Gross per 24 hour  Intake   1003 ml  Output      0 ml  Net   1003 ml   There were no vitals filed for this visit.  Exam:   General:  A+Ox3, NAD  Cardiovascular: rrr  Respiratory: clear  Abdomen: +BS, soft  Musculoskeletal: no edema   Data Reviewed: Basic Metabolic Panel:  Recent Labs Lab 03/25/14 1300 03/26/14 0250  NA 139 137  K 3.2* 3.6  CL 103 106  CO2 26 27  GLUCOSE 110* 97  BUN 9 7  CREATININE 0.37* 0.39*  CALCIUM 8.9 7.9*   Liver Function Tests: No results for input(s): AST, ALT, ALKPHOS, BILITOT, PROT, ALBUMIN in the last 168 hours. No results for input(s): LIPASE, AMYLASE in the last 168 hours. No results for input(s): AMMONIA in the last 168  hours. CBC:  Recent Labs Lab 03/25/14 1300 03/26/14 0028 03/26/14 0250  WBC 7.0 6.1 6.1  NEUTROABS 4.6  --   --   HGB 10.4* 10.0* 9.7*  HCT 32.7* 30.8* 30.1*  MCV 87.0 89.0 89.3  PLT 343 319 306   Cardiac Enzymes:  Recent Labs Lab 03/26/14 0250 03/26/14 0740  TROPONINI <0.03 <0.03   BNP (last 3 results) No results for input(s): PROBNP in the last 8760 hours. CBG: No results for input(s): GLUCAP in the last 168 hours.  No results found for this or any previous visit (from the past 240 hour(s)).   Studies: Dg Chest 2 View  03/25/2014   CLINICAL DATA:  Right-sided chest pain and difficulty breathing. Status post mastectomy for breast carcinoma  EXAM: CHEST  2 VIEW  COMPARISON:  Chest CT and chest radiograph June 19, 2013  FINDINGS: There are multiple nodular opacities in the right lung consistent with metastatic foci. These opacities have increased compared to prior studies. There is scarring in the left base. There is no frank airspace consolidation. Heart size and pulmonary vascularity are normal. No adenopathy. Port-A-Cath tip is at the cavoatrial junction. No pneumothorax. Patient is status post right mastectomy. There are no blastic or lytic bone lesions.  IMPRESSION: Pulmonary nodular opacities on the right consistent with metastatic foci, increased. Scarring left base. No airspace consolidation. Status post right mastectomy.   Electronically Signed   By: Lowella Grip M.D.   On: 03/25/2014 14:01  Ct Angio Chest Pe W/cm &/or Wo Cm  03/25/2014   CLINICAL DATA:  74 year old female with history breast cancer with metastatic disease. Evaluate for pulmonary embolism.  EXAM: CT ANGIOGRAPHY CHEST WITH CONTRAST  TECHNIQUE: Multidetector CT imaging of the chest was performed using the standard protocol during bolus administration of intravenous contrast. Multiplanar CT image reconstructions and MIPs were obtained to evaluate the vascular anatomy.  CONTRAST:  113mL OMNIPAQUE IOHEXOL  350 MG/ML SOLN  COMPARISON:  Chest CT 06/19/2013.  FINDINGS: Mediastinum/Lymph Nodes: There are several subsegmental sized filling defects within the pulmonary arterial tree in the right lung, involving right middle and right lower lobe pulmonary artery branches. No larger segmental, lobar or central filling defect is noted. Heart size is borderline enlarged. There is no significant pericardial fluid, thickening or pericardial calcification. Left internal jugular single-lumen power porta cath with tip terminating in the right atrium. Multiple borderline enlarged mediastinal and hilar lymph nodes are noted, but are nonspecific. No definite enlarged mediastinal, hilar, internal mammary or axillary lymph nodes. Esophagus is unremarkable in appearance.  Lungs/Pleura: Compared to the prior study, there has been an interval increase in the number and size of numerous pulmonary nodules throughout the lungs bilaterally, compatible with progressive metastatic disease. The largest of these is a partially cavitary nodule in the right upper lobe (image 33 of series 408), which currently measures 2.1 x 1.6 cm (previously 9 x 10 mm). The largest new nodule is an ovoid shaped lesion in the inferior segment of the lingula measuring 2.2 x 1.2 cm (image 66 of series 408). Trace right pleural effusion layering dependently. There is significant pleural thickening and nodularity in the left hemithorax, most notably in the left posterior costophrenic sulcus, presumably indicative of pleural spread of disease. Small amount of subpleural reticulation in the periphery of the right upper and middle lobes, presumably post radiation changes. No consolidative airspace disease.  Musculoskeletal/Soft Tissues: Status post right modified radical mastectomy. No soft tissue mass along the right chest wall to suggest local recurrence of disease. There are no aggressive appearing lytic or blastic lesions noted in the visualized portions of the  skeleton.  Upper Abdomen: Unremarkable.  Review of the MIP images confirms the above findings.  IMPRESSION: 1. Study is positive for multiple tiny subsegmental sized pulmonary emboli in the right middle and right lower lobe. No larger central, lobar or segmental sized emboli are noted. 2. Progressive metastatic disease to the lungs and pleura, as detailed above, significantly increased compared to prior study 06/19/2013. 3. Additional incidental findings, as above.   Electronically Signed   By: Vinnie Langton M.D.   On: 03/25/2014 15:53    Scheduled Meds: . amitriptyline  25 mg Oral QHS  . calcium carbonate  1 tablet Oral Q breakfast  . famotidine  20 mg Oral BID  . fluticasone  1 puff Inhalation BID  . multivitamin with minerals  1 tablet Oral Daily  . nitrofurantoin (macrocrystal-monohydrate)  100 mg Oral BID  . potassium chloride SA  20 mEq Oral BID  . sodium chloride  3 mL Intravenous Q12H   Continuous Infusions: . sodium chloride 75 mL/hr at 03/26/14 0732  . heparin 1,300 Units/hr (03/26/14 0958)   Antibiotics Given (last 72 hours)    Date/Time Action Medication Dose   03/26/14 0348 Given   nitrofurantoin (macrocrystal-monohydrate) (MACROBID) capsule 100 mg 100 mg   03/26/14 0959 Given   nitrofurantoin (macrocrystal-monohydrate) (MACROBID) capsule 100 mg 100 mg      Principal Problem:  Pulmonary embolism Active Problems:   Breast cancer   Chest pain   Asthma   Hypokalemia   UTI (lower urinary tract infection)    Time spent: 25 min    Krithi Bray  [facility] Pager [phone]. If 7PM-7AM, please contact night-coverage at [email or web address], password [facility] 03/26/2014, 10:52 AM  LOS: 1 day

## 2014-03-26 NOTE — Progress Notes (Signed)
PHARMACIST - PHYSICIAN ORDER COMMUNICATION  CONCERNING: P&T Medication Policy on Herbal Medications  DESCRIPTION:  This patient's order for: Glucosamine  has been noted.  This product(s) is classified as an "herbal" or natural product. Due to a lack of definitive safety studies or FDA approval, nonstandard manufacturing practices, plus the potential risk of unknown drug-drug interactions while on inpatient medications, the Pharmacy and Therapeutics Committee does not permit the use of "herbal" or natural products of this type within [place].   ACTION TAKEN: The pharmacy department is unable to verify this order at this time and your patient has been informed of this safety policy. Please reevaluate patient's clinical condition at discharge and address if the herbal or natural product(s) should be resumed at that time.  

## 2014-03-26 NOTE — Progress Notes (Signed)
UR completed 

## 2014-03-26 NOTE — ED Notes (Signed)
Ordered heart healthy tray  

## 2014-03-27 DIAGNOSIS — E876 Hypokalemia: Secondary | ICD-10-CM

## 2014-03-27 LAB — BASIC METABOLIC PANEL
ANION GAP: 9 (ref 5–15)
BUN: 6 mg/dL (ref 6–23)
CO2: 30 mmol/L (ref 19–32)
Calcium: 8.6 mg/dL (ref 8.4–10.5)
Chloride: 99 mEq/L (ref 96–112)
Creatinine, Ser: 0.42 mg/dL — ABNORMAL LOW (ref 0.50–1.10)
GFR calc Af Amer: >90 mL/min (ref >90)
Glucose, Bld: 95 mg/dL (ref 70–99)
POTASSIUM: 4.1 mmol/L (ref 3.5–5.1)
SODIUM: 138 mmol/L (ref 135–145)

## 2014-03-27 LAB — CBC
HCT: 31.9 % — ABNORMAL LOW (ref 36.0–46.0)
Hemoglobin: 10.1 g/dL — ABNORMAL LOW (ref 12.0–15.0)
MCH: 28.5 pg (ref 26.0–34.0)
MCHC: 31.7 g/dL (ref 30.0–36.0)
MCV: 90.1 fL (ref 78.0–100.0)
Platelets: 332 10*3/uL (ref 150–400)
RBC: 3.54 MIL/uL — ABNORMAL LOW (ref 3.87–5.11)
RDW: 16 % — AB (ref 11.5–15.5)
WBC: 6.7 10*3/uL (ref 4.0–10.5)

## 2014-03-27 LAB — HEPARIN LEVEL (UNFRACTIONATED): Heparin Unfractionated: 0.59 IU/mL (ref 0.30–0.70)

## 2014-03-27 MED ORDER — HYDROCODONE-ACETAMINOPHEN 5-325 MG PO TABS: 1.0000 | ORAL_TABLET | ORAL | Status: DC | PRN

## 2014-03-27 MED ORDER — RIVAROXABAN (XARELTO) VTE STARTER PACK (15 & 20 MG): ORAL_TABLET | ORAL | Status: DC

## 2014-03-27 MED ORDER — RIVAROXABAN (XARELTO) EDUCATION KIT FOR DVT/PE PATIENTS
PACK | Freq: Once | Status: DC
  Filled 2014-03-27: qty 1

## 2014-03-27 MED ORDER — HEPARIN SOD (PORK) LOCK FLUSH 100 UNIT/ML IV SOLN
500.0000 [IU] | INTRAVENOUS | Status: AC | PRN
  Administered 2014-03-27: 500 [IU]

## 2014-03-27 MED ORDER — RIVAROXABAN 15 MG PO TABS
15.0000 mg | ORAL_TABLET | Freq: Two times a day (BID) | ORAL | Status: DC
  Administered 2014-03-27: 15 mg via ORAL
  Filled 2014-03-27: qty 1

## 2014-03-27 NOTE — Discharge Summary (Signed)
Physician Discharge Summary  [name] [name] EHU:[id number] DOB: May 02, 1940 DOA: 03/25/2014  PCP: Cari Caraway, MD  Admit date: 03/25/2014 Discharge date: 03/27/2014  Time spent: 35 minutes  Recommendations for Outpatient Follow-up:  1. Follow up at [facility]  Discharge Diagnoses:  Principal Problem:   Pulmonary embolism Active Problems:   Breast cancer   Chest pain   Asthma   Hypokalemia   UTI (lower urinary tract infection)   Discharge Condition: improved  Diet recommendation: cardiac  Filed Weights   03/26/14 1239 03/27/14 0455  Weight: 75.3 kg (166 lb 0.1 oz) 76.25 kg (168 lb 1.6 oz)    History of present illness:  [name] [name] is a 74 y.o. female, with known history of metastatic breast cancer to the chest, on chemotherapy at [facility], asthma, presents with complaints of shortness of breath and chest pain, reports symptoms of sudden onset from sleep this morning, chest pain pleuritic, worsened by deep inspiration, not tachycardic, not hypoxic, troponins are negative, EKG nonacute, CT chest angiogram is positive for multiple tiny subsegmental sized pulmonary emboli in the right middle and right lower lobe, no large central lobar or segmental size emboli, CT chest showing as well progressive metastatic disease of the lung, lower extremity venous Doppler was negative for DVT, patient was started on heparin GTT in ED, and hospitalist requested to admit. Patient reports she had fever last week, where she went at [facility], told she has UTI, and was started on nitrofurantoin  Hospital Course:  Pulmonary embolism -heparin drip x 48 hours transitioned to Xarelto (discussed with hematology lovenox vs xarelto- xarelto ok to use) - left LE duplex negative  Chest pain - Pleuritic, most likely related to pulmonary embolism, resolves with pain medications  Breast cancer - Patient is following with [facility], and chemotherapy. -has appointment Wednesday  Asthma - No active  wheezing, continue with when necessary ipratropium.  Hypokalemia - replace  UTI -Patient was diagnosed with last Friday on [facility], will need to continue her nitrofurantoin total of 7 days treatment  Procedures:    Consultations:    Discharge Exam: Filed Vitals:   03/27/14 0455  BP: 111/54  Pulse: 76  Temp: 98.2 F (36.8 C)  Resp: 18    General: A+Ox3, NAD Cardiovascular: rrr Respiratory: clear  Discharge Instructions   Discharge Instructions    Diet general    Complete by:  As directed      Discharge instructions    Complete by:  As directed   Will be on xarelto 15 mg BID x 21 days then 20 mg daily Watch for signs of bleeding, dark stools etc- return to er     Increase activity slowly    Complete by:  As directed           Current Discharge Medication List    START taking these medications   Details  HYDROcodone-acetaminophen (NORCO/VICODIN) 5-325 MG per tablet Take 1 tablet by mouth every 4 (four) hours as needed for moderate pain. Qty: 30 tablet, Refills: 0    Rivaroxaban (XARELTO STARTER PACK) 15 & 20 MG TBPK Take as directed on package: Start with one 15mg  tablet by mouth twice a day with food. On Day 22, switch to one 20mg  tablet once a day with food. Qty: 51 each, Refills: 0      CONTINUE these medications which have NOT CHANGED   Details  amitriptyline (ELAVIL) 25 MG tablet Take 1 tablet by mouth at bedtime.    beclomethasone (QVAR) 40 MCG/ACT inhaler Inhale 2  puffs into the lungs 2 (two) times daily.     Calcium Carbonate-Vitamin D (CALCIUM + D PO) Take 2 tablets by mouth daily.    Glucosamine HCl-MSM 1500-500 MG/30ML LIQD Take 30 mLs by mouth daily.    hydroxypropyl methylcellulose (ISOPTO TEARS) 2.5 % ophthalmic solution Place 1 drop into both eyes as needed for dry eyes.    ipratropium (ATROVENT HFA) 17 MCG/ACT inhaler Inhale 2 puffs into the lungs 4 (four) times daily as needed.    Multiple Vitamin (MULTIVITAMIN WITH MINERALS) TABS  tablet Take 1 tablet by mouth daily.    nitrofurantoin, macrocrystal-monohydrate, (MACROBID) 100 MG capsule Take 100 mg by mouth 2 (two) times daily.    potassium chloride SA (K-DUR,KLOR-CON) 20 MEQ tablet Take 20 mEq by mouth 2 (two) times daily.    ranitidine (ZANTAC) 150 MG tablet Take 150 mg by mouth 2 (two) times daily.      STOP taking these medications     potassium chloride (K-DUR) 10 MEQ tablet        Allergies  Allergen Reactions  . Penicillins Other (See Comments)    Childhood reaction  . Adhesive [Tape]   . Albuterol   . Codeine   . Ibuprofen Other (See Comments)    Induces patient asthma  . Meperidine     Other reaction(s): Other (See Comments) "wiped out" and nausea  . Other     Unknown narcotic  . Phenobarbital Other (See Comments)    Erratic behavior   Follow-up Information    Follow up with MCNEILL,WENDY, MD In 1 week.   Specialty:  Family Medicine   Contact information:   [place] [address] [phone]        The results of significant diagnostics from this hospitalization (including imaging, microbiology, ancillary and laboratory) are listed below for reference.    Significant Diagnostic Studies: Dg Chest 2 View  03/25/2014   CLINICAL DATA:  Right-sided chest pain and difficulty breathing. Status post mastectomy for breast carcinoma  EXAM: CHEST  2 VIEW  COMPARISON:  Chest CT and chest radiograph June 19, 2013  FINDINGS: There are multiple nodular opacities in the right lung consistent with metastatic foci. These opacities have increased compared to prior studies. There is scarring in the left base. There is no frank airspace consolidation. Heart size and pulmonary vascularity are normal. No adenopathy. Port-A-Cath tip is at the cavoatrial junction. No pneumothorax. Patient is status post right mastectomy. There are no blastic or lytic bone lesions.  IMPRESSION: Pulmonary nodular opacities on the right consistent with metastatic  foci, increased. Scarring left base. No airspace consolidation. Status post right mastectomy.   Electronically Signed   By: Lowella Grip M.D.   On: 03/25/2014 14:01   Ct Angio Chest Pe W/cm &/or Wo Cm  03/25/2014   CLINICAL DATA:  74 year old female with history breast cancer with metastatic disease. Evaluate for pulmonary embolism.  EXAM: CT ANGIOGRAPHY CHEST WITH CONTRAST  TECHNIQUE: Multidetector CT imaging of the chest was performed using the standard protocol during bolus administration of intravenous contrast. Multiplanar CT image reconstructions and MIPs were obtained to evaluate the vascular anatomy.  CONTRAST:  176mL OMNIPAQUE IOHEXOL 350 MG/ML SOLN  COMPARISON:  Chest CT 06/19/2013.  FINDINGS: Mediastinum/Lymph Nodes: There are several subsegmental sized filling defects within the pulmonary arterial tree in the right lung, involving right middle and right lower lobe pulmonary artery branches. No larger segmental, lobar or central filling defect is noted. Heart size is borderline enlarged.  There is no significant pericardial fluid, thickening or pericardial calcification. Left internal jugular single-lumen power porta cath with tip terminating in the right atrium. Multiple borderline enlarged mediastinal and hilar lymph nodes are noted, but are nonspecific. No definite enlarged mediastinal, hilar, internal mammary or axillary lymph nodes. Esophagus is unremarkable in appearance.  Lungs/Pleura: Compared to the prior study, there has been an interval increase in the number and size of numerous pulmonary nodules throughout the lungs bilaterally, compatible with progressive metastatic disease. The largest of these is a partially cavitary nodule in the right upper lobe (image 33 of series 408), which currently measures 2.1 x 1.6 cm (previously 9 x 10 mm). The largest new nodule is an ovoid shaped lesion in the inferior segment of the lingula measuring 2.2 x 1.2 cm (image 66 of series 408). Trace right  pleural effusion layering dependently. There is significant pleural thickening and nodularity in the left hemithorax, most notably in the left posterior costophrenic sulcus, presumably indicative of pleural spread of disease. Small amount of subpleural reticulation in the periphery of the right upper and middle lobes, presumably post radiation changes. No consolidative airspace disease.  Musculoskeletal/Soft Tissues: Status post right modified radical mastectomy. No soft tissue mass along the right chest wall to suggest local recurrence of disease. There are no aggressive appearing lytic or blastic lesions noted in the visualized portions of the skeleton.  Upper Abdomen: Unremarkable.  Review of the MIP images confirms the above findings.  IMPRESSION: 1. Study is positive for multiple tiny subsegmental sized pulmonary emboli in the right middle and right lower lobe. No larger central, lobar or segmental sized emboli are noted. 2. Progressive metastatic disease to the lungs and pleura, as detailed above, significantly increased compared to prior study 06/19/2013. 3. Additional incidental findings, as above.   Electronically Signed   By: Vinnie Langton M.D.   On: 03/25/2014 15:53    Microbiology: No results found for this or any previous visit (from the past 240 hour(s)).   Labs: Basic Metabolic Panel:  Recent Labs Lab 03/25/14 1300 03/26/14 0250 03/27/14 0041  NA 139 137 138  K 3.2* 3.6 4.1  CL 103 106 99  CO2 26 27 30   GLUCOSE 110* 97 95  BUN 9 7 6   CREATININE 0.37* 0.39* 0.42*  CALCIUM 8.9 7.9* 8.6   Liver Function Tests: No results for input(s): AST, ALT, ALKPHOS, BILITOT, PROT, ALBUMIN in the last 168 hours. No results for input(s): LIPASE, AMYLASE in the last 168 hours. No results for input(s): AMMONIA in the last 168 hours. CBC:  Recent Labs Lab 03/25/14 1300 03/26/14 0028 03/26/14 0250 03/27/14 0041  WBC 7.0 6.1 6.1 6.7  NEUTROABS 4.6  --   --   --   HGB 10.4* 10.0* 9.7*  10.1*  HCT 32.7* 30.8* 30.1* 31.9*  MCV 87.0 89.0 89.3 90.1  PLT 343 319 306 332   Cardiac Enzymes:  Recent Labs Lab 03/26/14 0250 03/26/14 0740 03/26/14 1428  TROPONINI <0.03 <0.03 <0.03   BNP: BNP (last 3 results) No results for input(s): PROBNP in the last 8760 hours. CBG: No results for input(s): GLUCAP in the last 168 hours.     SignedEulogio Bear  [facility] 03/27/2014, 11:47 AM

## 2014-03-27 NOTE — Care Management Note (Addendum)
    Page 1 of 1   03/27/2014     12:51:47 PM CARE MANAGEMENT NOTE 03/27/2014  Patient:  [name] [name], [name] [name]   Account Number:  [id number]  Date Initiated:  03/27/2014  Documentation initiated by:  GRAVES-BIGELOW,Samai Corea  Subjective/Objective Assessment:   Pt admitted for pulmonary embolus. Plan for d/c home on xarelto.     Action/Plan:   CM has benefits check in process and will make pt aware once completed. CM will provide pt with 30 day free card. Pt will need Rx to go with card.   Anticipated DC Date:  03/27/2014   Anticipated DC Plan:  [place]  CM consult  Medication Assistance      Choice offered to / List presented to:             Status of service:  Completed, signed off Medicare Important Message given?  NA - LOS <3 / Initial given by admissions (If response is "NO", the following Medicare IM given date fields will be blank) Date Medicare IM given:   Medicare IM given by:   Date Additional Medicare IM given:   Additional Medicare IM given by:    Discharge Disposition:  HOME/SELF CARE  Per UR Regulation:  Reviewed for med. necessity/level of care/duration of stay  If discussed at [place] of Stay Meetings, dates discussed:    Comments:  1242 03-27-14 Jacqlyn Krauss, RN, BSN [phone]  per rep at [facility]:  xarelto 15mg : $300.83 with deductible for 30 day retail; $155.82 without deductible 30 day retail/ $158.57 without deductible mail order 90 day;  $299.95 with deductible 90 day mail order  patient has a deductible of: $188.52 left to meet  patient can use: [facility] and [facility] are preferred, but patient can use any pharmacy (co-pay may be a little higher)  CM did provide pt with 30 day free xarelto card. Pt uses [facility] and medication is available. No further needs from CM at this time.

## 2014-03-27 NOTE — Discharge Instructions (Addendum)
Information on my medicine - XARELTO (rivaroxaban)  This medication education was reviewed with me or my healthcare representative as part of my discharge preparation.  The pharmacist that spoke with me during my hospital stay was:  Jaquita Folds, [place]? Xarelto was prescribed to treat blood clots that may have been found in the veins of your legs (deep vein thrombosis) or in your lungs (pulmonary embolism) and to reduce the risk of them occurring again.  What do you need to know about Xarelto? The starting dose is one 15 mg tablet taken TWICE daily with food for the FIRST 21 DAYS then on (enter date)  04/17/14  the dose is changed to one 20 mg tablet taken ONCE A DAY with your evening meal.  DO NOT stop taking Xarelto without talking to the health care provider who prescribed the medication.  Refill your prescription for 20 mg tablets before you run out.  After discharge, you should have regular check-up appointments with your healthcare provider that is prescribing your Xarelto.  In the future your dose may need to be changed if your kidney function changes by a significant amount.  What do you do if you miss a dose? If you are taking Xarelto TWICE DAILY and you miss a dose, take it as soon as you remember. You may take two 15 mg tablets (total 30 mg) at the same time then resume your regularly scheduled 15 mg twice daily the next day.  If you are taking Xarelto ONCE DAILY and you miss a dose, take it as soon as you remember on the same day then continue your regularly scheduled once daily regimen the next day. Do not take two doses of Xarelto at the same time.   Important Safety Information Xarelto is a blood thinner medicine that can cause bleeding. You should call your healthcare provider right away if you experience any of the following: ? Bleeding from an injury or your nose that does not stop. ? Unusual colored urine (red or dark brown) or  unusual colored stools (red or black). ? Unusual bruising for unknown reasons. ? A serious fall or if you hit your head (even if there is no bleeding).  Some medicines may interact with Xarelto and might increase your risk of bleeding while on Xarelto. To help avoid this, consult your healthcare provider or pharmacist prior to using any new prescription or non-prescription medications, including herbals, vitamins, non-steroidal anti-inflammatory drugs (NSAIDs) and supplements.  This website has more information on Xarelto: [email or web address]

## 2014-03-27 NOTE — Progress Notes (Signed)
ANTICOAGULATION CONSULT NOTE - Follow Up Consult  Pharmacy Consult for Xarelto Indication: pulmonary embolus  Allergies  Allergen Reactions  . Penicillins Other (See Comments)    Childhood reaction  . Adhesive [Tape]   . Albuterol   . Codeine   . Ibuprofen Other (See Comments)    Induces patient asthma  . Meperidine     Other reaction(s): Other (See Comments) "wiped out" and nausea  . Other     Unknown narcotic  . Phenobarbital Other (See Comments)    Erratic behavior    Patient Measurements: Height: 5\' 3"  (160 cm) Weight: 168 lb 1.6 oz (76.25 kg) IBW/kg (Calculated) : 52.4 Heparin Dosing Weight:   Vital Signs: Temp: 98.2 F (36.8 C) (01/12 0455) Temp Source: Oral (01/12 0455) BP: 111/54 mmHg (01/12 0455) Pulse Rate: 76 (01/12 0455)  Labs:  Recent Labs  03/25/14 1300 03/26/14 0028 03/26/14 0250 03/26/14 0740 03/26/14 0746 03/26/14 1428 03/27/14 0041  HGB 10.4* 10.0* 9.7*  --   --   --  10.1*  HCT 32.7* 30.8* 30.1*  --   --   --  31.9*  PLT 343 319 306  --   --   --  332  LABPROT 13.8  --   --   --   --   --   --   INR 1.05  --   --   --   --   --   --   HEPARINUNFRC  --  0.65  --   --  0.79*  --  0.59  CREATININE 0.37*  --  0.39*  --   --   --  0.42*  TROPONINI  --   --  <0.03 <0.03  --  <0.03  --     Estimated Creatinine Clearance: 61.3 mL/min (by C-G formula based on Cr of 0.42).   Medications:  Scheduled:  . amitriptyline  25 mg Oral QHS  . calcium carbonate  1 tablet Oral Q breakfast  . famotidine  20 mg Oral BID  . fluticasone  1 puff Inhalation BID  . multivitamin with minerals  1 tablet Oral Daily  . nitrofurantoin (macrocrystal-monohydrate)  100 mg Oral BID  . potassium chloride SA  20 mEq Oral BID  . sodium chloride  3 mL Intravenous Q12H    Assessment: 74yo female with breast cancer, presented with new PE.  SHe has been on Heparin overnight, now to change to Xarelto with plan to d/c home today.  HL was therapeutic this AM.  Hg 10.1-  stable, pltc wnl, and Cr < 1.  Goal of Therapy:   Monitor platelets by anticoagulation protocol: Yes   Plan:  -D/C Heparin with first dose of Xarelto -Xarelto 15mg  bid x 21 days, then 20mg  daily -D/C all heparin labs -Xarelto education  Gracy Bruins, PharmD Clinical Pharmacist [facility]

## 2014-05-20 ENCOUNTER — Emergency Department (HOSPITAL_COMMUNITY): Payer: Medicare Other

## 2014-05-20 ENCOUNTER — Encounter (HOSPITAL_COMMUNITY): Payer: Self-pay | Admitting: *Deleted

## 2014-05-20 ENCOUNTER — Observation Stay (HOSPITAL_COMMUNITY)
Admission: EM | Admit: 2014-05-20 | Discharge: 2014-05-21 | Disposition: A | Discharge status: Home / Self Care | Payer: Medicare Other | Attending: Internal Medicine | Admitting: Internal Medicine

## 2014-05-20 ENCOUNTER — Other Ambulatory Visit (HOSPITAL_COMMUNITY): Payer: Self-pay

## 2014-05-20 DIAGNOSIS — C7800 Secondary malignant neoplasm of unspecified lung: Secondary | ICD-10-CM | POA: Insufficient documentation

## 2014-05-20 DIAGNOSIS — E46 Unspecified protein-calorie malnutrition: Secondary | ICD-10-CM | POA: Insufficient documentation

## 2014-05-20 DIAGNOSIS — Z7901 Long term (current) use of anticoagulants: Secondary | ICD-10-CM | POA: Insufficient documentation

## 2014-05-20 DIAGNOSIS — D649 Anemia, unspecified: Secondary | ICD-10-CM | POA: Diagnosis present

## 2014-05-20 DIAGNOSIS — D630 Anemia in neoplastic disease: Secondary | ICD-10-CM | POA: Insufficient documentation

## 2014-05-20 DIAGNOSIS — C50919 Malignant neoplasm of unspecified site of unspecified female breast: Secondary | ICD-10-CM | POA: Insufficient documentation

## 2014-05-20 DIAGNOSIS — Z86711 Personal history of pulmonary embolism: Secondary | ICD-10-CM | POA: Diagnosis not present

## 2014-05-20 DIAGNOSIS — R0781 Pleurodynia: Principal | ICD-10-CM | POA: Insufficient documentation

## 2014-05-20 DIAGNOSIS — R791 Abnormal coagulation profile: Secondary | ICD-10-CM

## 2014-05-20 DIAGNOSIS — J45909 Unspecified asthma, uncomplicated: Secondary | ICD-10-CM | POA: Insufficient documentation

## 2014-05-20 DIAGNOSIS — K529 Noninfective gastroenteritis and colitis, unspecified: Secondary | ICD-10-CM | POA: Diagnosis present

## 2014-05-20 DIAGNOSIS — I2782 Chronic pulmonary embolism: Secondary | ICD-10-CM | POA: Diagnosis not present

## 2014-05-20 DIAGNOSIS — E876 Hypokalemia: Secondary | ICD-10-CM | POA: Diagnosis not present

## 2014-05-20 DIAGNOSIS — R21 Rash and other nonspecific skin eruption: Secondary | ICD-10-CM | POA: Diagnosis not present

## 2014-05-20 DIAGNOSIS — L271 Localized skin eruption due to drugs and medicaments taken internally: Secondary | ICD-10-CM | POA: Diagnosis not present

## 2014-05-20 DIAGNOSIS — R0602 Shortness of breath: Secondary | ICD-10-CM | POA: Diagnosis present

## 2014-05-20 DIAGNOSIS — T451X5A Adverse effect of antineoplastic and immunosuppressive drugs, initial encounter: Secondary | ICD-10-CM | POA: Insufficient documentation

## 2014-05-20 DIAGNOSIS — R071 Chest pain on breathing: Secondary | ICD-10-CM | POA: Diagnosis present

## 2014-05-20 DIAGNOSIS — K219 Gastro-esophageal reflux disease without esophagitis: Secondary | ICD-10-CM | POA: Insufficient documentation

## 2014-05-20 DIAGNOSIS — R079 Chest pain, unspecified: Secondary | ICD-10-CM

## 2014-05-20 DIAGNOSIS — C3491 Malignant neoplasm of unspecified part of right bronchus or lung: Secondary | ICD-10-CM

## 2014-05-20 DIAGNOSIS — I2699 Other pulmonary embolism without acute cor pulmonale: Secondary | ICD-10-CM

## 2014-05-20 LAB — HEPATIC FUNCTION PANEL
ALBUMIN: 2.5 g/dL — AB (ref 3.5–5.2)
ALK PHOS: 38 U/L — AB (ref 39–117)
ALT: 17 U/L (ref 0–35)
AST: 16 U/L (ref 0–37)
BILIRUBIN TOTAL: 0.4 mg/dL (ref 0.3–1.2)
Bilirubin, Direct: <0.1 mg/dL (ref 0.0–0.5)
TOTAL PROTEIN: 4.4 g/dL — AB (ref 6.0–8.3)

## 2014-05-20 LAB — CBC
HCT: 30.7 % — ABNORMAL LOW (ref 36.0–46.0)
Hemoglobin: 9.8 g/dL — ABNORMAL LOW (ref 12.0–15.0)
MCH: 28.8 pg (ref 26.0–34.0)
MCHC: 31.9 g/dL (ref 30.0–36.0)
MCV: 90.3 fL (ref 78.0–100.0)
PLATELETS: 278 10*3/uL (ref 150–400)
RBC: 3.4 MIL/uL — ABNORMAL LOW (ref 3.87–5.11)
RDW: 19.7 % — ABNORMAL HIGH (ref 11.5–15.5)
WBC: 4.4 10*3/uL (ref 4.0–10.5)

## 2014-05-20 LAB — PROTIME-INR
INR: 3.77 — AB (ref 0.00–1.49)
Prothrombin Time: 37.5 seconds — ABNORMAL HIGH (ref 11.6–15.2)

## 2014-05-20 LAB — BASIC METABOLIC PANEL
Anion gap: 9 (ref 5–15)
BUN: 11 mg/dL (ref 6–23)
CHLORIDE: 107 mmol/L (ref 96–112)
CO2: 25 mmol/L (ref 19–32)
CREATININE: 0.32 mg/dL — AB (ref 0.50–1.10)
Calcium: 7.9 mg/dL — ABNORMAL LOW (ref 8.4–10.5)
GFR calc Af Amer: >90 mL/min (ref >90)
Glucose, Bld: 81 mg/dL (ref 70–99)
POTASSIUM: 2.7 mmol/L — AB (ref 3.5–5.1)
Sodium: 141 mmol/L (ref 135–145)

## 2014-05-20 LAB — BRAIN NATRIURETIC PEPTIDE: B NATRIURETIC PEPTIDE 5: 92.7 pg/mL (ref 0.0–100.0)

## 2014-05-20 LAB — I-STAT TROPONIN, ED: TROPONIN I, POC: 0.01 ng/mL (ref 0.00–0.08)

## 2014-05-20 LAB — URINALYSIS, ROUTINE W REFLEX MICROSCOPIC
BILIRUBIN URINE: NEGATIVE
Glucose, UA: NEGATIVE mg/dL
Hgb urine dipstick: NEGATIVE
Ketones, ur: NEGATIVE mg/dL
Leukocytes, UA: NEGATIVE
Nitrite: NEGATIVE
PROTEIN: NEGATIVE mg/dL
Specific Gravity, Urine: 1.03 (ref 1.005–1.030)
Urobilinogen, UA: 0.2 mg/dL (ref 0.0–1.0)
pH: 7 (ref 5.0–8.0)

## 2014-05-20 LAB — TROPONIN I: Troponin I: <0.03 ng/mL (ref <0.031)

## 2014-05-20 LAB — CLOSTRIDIUM DIFFICILE BY PCR: CDIFFPCR: NEGATIVE

## 2014-05-20 MED ORDER — ADULT MULTIVITAMIN W/MINERALS CH
1.0000 | ORAL_TABLET | Freq: Every day | ORAL | Status: DC
  Administered 2014-05-21: 1 via ORAL
  Filled 2014-05-20: qty 1

## 2014-05-20 MED ORDER — ENOXAPARIN SODIUM 60 MG/0.6ML ~~LOC~~ SOLN
60.0000 mg | Freq: Two times a day (BID) | SUBCUTANEOUS | Status: DC
  Administered 2014-05-20 – 2014-05-21 (×3): 60 mg via SUBCUTANEOUS
  Filled 2014-05-20 (×4): qty 0.6

## 2014-05-20 MED ORDER — FENTANYL CITRATE 0.05 MG/ML IJ SOLN
50.0000 ug | Freq: Once | INTRAMUSCULAR | Status: AC
  Administered 2014-05-20: 50 ug via INTRAVENOUS
  Filled 2014-05-20: qty 2

## 2014-05-20 MED ORDER — HYPROMELLOSE (GONIOSCOPIC) 2.5 % OP SOLN: 1.0000 [drp] | OPHTHALMIC | Status: DC | PRN

## 2014-05-20 MED ORDER — AMITRIPTYLINE HCL 25 MG PO TABS: 25.0000 mg | ORAL_TABLET | Freq: Every day | ORAL | Status: DC

## 2014-05-20 MED ORDER — POTASSIUM CHLORIDE CRYS ER 20 MEQ PO TBCR
40.0000 meq | EXTENDED_RELEASE_TABLET | Freq: Once | ORAL | Status: AC
  Administered 2014-05-20: 40 meq via ORAL
  Filled 2014-05-20: qty 2

## 2014-05-20 MED ORDER — SODIUM CHLORIDE 0.9 % IJ SOLN
10.0000 mL | INTRAMUSCULAR | Status: DC | PRN
  Administered 2014-05-21: 10 mL
  Filled 2014-05-20: qty 40

## 2014-05-20 MED ORDER — IPRATROPIUM BROMIDE HFA 17 MCG/ACT IN AERS: 2.0000 | INHALATION_SPRAY | Freq: Four times a day (QID) | RESPIRATORY_TRACT | Status: DC | PRN

## 2014-05-20 MED ORDER — ONDANSETRON HCL 4 MG/2ML IJ SOLN
4.0000 mg | Freq: Once | INTRAMUSCULAR | Status: AC
  Administered 2014-05-20: 4 mg via INTRAVENOUS
  Filled 2014-05-20: qty 2

## 2014-05-20 MED ORDER — CALCIUM CARBONATE-VITAMIN D 500-200 MG-UNIT PO TABS
1.0000 | ORAL_TABLET | Freq: Every day | ORAL | Status: DC
  Administered 2014-05-21: 1 via ORAL
  Filled 2014-05-20: qty 1

## 2014-05-20 MED ORDER — SODIUM CHLORIDE 0.9 % IV SOLN
INTRAVENOUS | Status: DC
  Administered 2014-05-20 – 2014-05-21 (×2): via INTRAVENOUS

## 2014-05-20 MED ORDER — FAMOTIDINE 10 MG PO TABS
10.0000 mg | ORAL_TABLET | Freq: Every day | ORAL | Status: DC
  Administered 2014-05-20: 10 mg via ORAL
  Filled 2014-05-20 (×2): qty 1

## 2014-05-20 MED ORDER — POTASSIUM CHLORIDE CRYS ER 20 MEQ PO TBCR
20.0000 meq | EXTENDED_RELEASE_TABLET | Freq: Two times a day (BID) | ORAL | Status: DC
  Administered 2014-05-20 – 2014-05-21 (×2): 20 meq via ORAL
  Filled 2014-05-20 (×3): qty 1

## 2014-05-20 MED ORDER — NITROFURANTOIN MONOHYD MACRO 100 MG PO CAPS: 100.0000 mg | ORAL_CAPSULE | Freq: Two times a day (BID) | ORAL | Status: DC

## 2014-05-20 MED ORDER — RIVAROXABAN 20 MG PO TABS: 20.0000 mg | ORAL_TABLET | Freq: Every day | ORAL | Status: DC

## 2014-05-20 MED ORDER — FLUTICASONE PROPIONATE HFA 44 MCG/ACT IN AERO: 1.0000 | INHALATION_SPRAY | Freq: Two times a day (BID) | RESPIRATORY_TRACT | Status: DC

## 2014-05-20 MED ORDER — IPRATROPIUM BROMIDE 0.02 % IN SOLN: 0.5000 mg | Freq: Four times a day (QID) | RESPIRATORY_TRACT | Status: DC | PRN

## 2014-05-20 MED ORDER — POLYVINYL ALCOHOL 1.4 % OP SOLN
1.0000 [drp] | OPHTHALMIC | Status: DC | PRN
  Filled 2014-05-20: qty 15

## 2014-05-20 MED ORDER — HYDROCODONE-ACETAMINOPHEN 5-325 MG PO TABS: 1.0000 | ORAL_TABLET | ORAL | Status: DC | PRN

## 2014-05-20 MED ORDER — ACETAMINOPHEN 325 MG PO TABS: 650.0000 mg | ORAL_TABLET | ORAL | Status: DC | PRN

## 2014-05-20 MED ORDER — MORPHINE SULFATE 2 MG/ML IJ SOLN
2.0000 mg | INTRAMUSCULAR | Status: DC | PRN
Start: 2014-05-20 — End: 2014-05-21

## 2014-05-20 MED ORDER — ONDANSETRON HCL 4 MG/2ML IJ SOLN: 4.0000 mg | Freq: Four times a day (QID) | INTRAMUSCULAR | Status: DC | PRN

## 2014-05-20 MED ORDER — IOHEXOL 350 MG/ML SOLN
80.0000 mL | Freq: Once | INTRAVENOUS | Status: AC | PRN
  Administered 2014-05-20: 70 mL via INTRAVENOUS

## 2014-05-20 NOTE — Progress Notes (Signed)
Pt arrived from ED in NAD. A&O, assessment unremarkable, no skin issues, VSS, and port accessed with NS infusing.  Pt placed on Enteric Precautions and instructed to let nursing staff know when sample is available. Family at bedside instructed to wear gown and wash hands when exiting room.  Pt instructed on how to use call bell and to alert nursing to any concerns.  Verbalized understanding. Will continue to monitor pt closely.

## 2014-05-20 NOTE — ED Notes (Signed)
Pt reports onset this am of pain to right chest, increases when she takes a deep breath.

## 2014-05-20 NOTE — ED Provider Notes (Signed)
CSN: [id number]     Arrival date & time 05/20/14  0807 History   First MD Initiated Contact with Patient 05/20/14 [phone]     Chief Complaint  Patient presents with  . Shortness of Breath     (Consider location/radiation/quality/duration/timing/severity/associated sxs/prior Treatment) HPI   PCP: MCNEILL,WENDY, MD Blood pressure 126/60, pulse 88, temperature 98.3 F (36.8 C), temperature source Oral, resp. rate 19, height 5' 2.5" (1.588 m), weight 168 lb (76.204 kg), SpO2 100 %.  Pt presents with chest pain and SOB that started this morning. Pt has a known PE that was diagnosed in January 2015 and currently takes Lovenox BID. Pt has active BL breast cancer of the lung for which she receives chemotherapy. Pts oncologist Valaria Good, MD), hematologist, and lung specialist are at [facility]. Pt states that she woke up at 7am having sharp pain on the right side of her chest, which worsened throughout the morning, so she came to the [facility] ED. Pt reports that the pain is similar to the pain she experienced in the past due to PE. Pt states that the pain is worse with inspiration. She reports some mild lifting to help a friend move yesterday and has intermittent episodes of chronic diarrhea. Pt missed a dose of her Lovenox yesterday and states that this was the first time she has missed a dose. Pt denies fever, chills, night sweats, n/v. Pt states that she has not yet taken her dose of Lovenox today, as she usually takes it at 10am. Pt also reports mild swelling in her ankles.    Past Medical History  Diagnosis Date  . Asthma   . Breast pain in female     throbbing  . Abdominal pain   . Breast lump in female   . Cancer     rt  . PE (pulmonary embolism) 03/26/2014  . Neuropathy   . GERD (gastroesophageal reflux disease)   . History of hiatal hernia    Past Surgical History  Procedure Laterality Date  . Colonoscopy w/ polypectomy  12/11, 4/12  . Tonsillectomy    . Skin cancer excision   2011  . Bone density  2011  . Mastectomy      right july 2012  . Breast surgery      Right Mastectomy  . Lung biopsy  08/2013   History reviewed. No pertinent family history. History  Substance Use Topics  . Smoking status: Never Smoker   . Smokeless tobacco: Never Used  . Alcohol Use: No   OB History    No data available     Review of Systems 10 Systems reviewed and are negative for acute change except as noted in the HPI.    Allergies  Penicillins; Adhesive; Albuterol; Codeine; Ibuprofen; Meperidine; Other; and Phenobarbital  Home Medications   Prior to Admission medications   Medication Sig Start Date End Date Taking? Authorizing Provider  amitriptyline (ELAVIL) 25 MG tablet Take 1 tablet by mouth at bedtime. 03/21/14 03/21/15  Historical Provider, MD  beclomethasone (QVAR) 40 MCG/ACT inhaler Inhale 2 puffs into the lungs 2 (two) times daily.     Historical Provider, MD  Calcium Carbonate-Vitamin D (CALCIUM + D PO) Take 2 tablets by mouth daily.    Historical Provider, MD  Glucosamine HCl-MSM 1500-500 MG/30ML LIQD Take 30 mLs by mouth daily.    Historical Provider, MD  HYDROcodone-acetaminophen (NORCO/VICODIN) 5-325 MG per tablet Take 1 tablet by mouth every 4 (four) hours as needed for moderate pain. 03/27/14  Geradine Girt, DO  hydroxypropyl methylcellulose (ISOPTO TEARS) 2.5 % ophthalmic solution Place 1 drop into both eyes as needed for dry eyes.    Historical Provider, MD  ipratropium (ATROVENT HFA) 17 MCG/ACT inhaler Inhale 2 puffs into the lungs 4 (four) times daily as needed. 02/28/14 02/28/15  Historical Provider, MD  Multiple Vitamin (MULTIVITAMIN WITH MINERALS) TABS tablet Take 1 tablet by mouth daily.    Historical Provider, MD  nitrofurantoin, macrocrystal-monohydrate, (MACROBID) 100 MG capsule Take 100 mg by mouth 2 (two) times daily.    Historical Provider, MD  potassium chloride SA (K-DUR,KLOR-CON) 20 MEQ tablet Take 20 mEq by mouth 2 (two) times daily.     Historical Provider, MD  ranitidine (ZANTAC) 150 MG tablet Take 150 mg by mouth 2 (two) times daily.    Historical Provider, MD  Rivaroxaban (XARELTO STARTER PACK) 15 & 20 MG TBPK Take as directed on package: Start with one 15mg  tablet by mouth twice a day with food. On Day 22, switch to one 20mg  tablet once a day with food. 03/27/14   Jessica U Vann, DO   BP 126/60 mmHg  Pulse 88  Temp(Src) 98.3 F (36.8 C) (Oral)  Resp 19  Ht 5' 2.5" (1.588 m)  Wt 168 lb (76.204 kg)  BMI 30.22 kg/m2  SpO2 100% Physical Exam  Constitutional: She appears well-developed and well-nourished.  Non-toxic appearance. She does not have a sickly appearance. She does not appear ill. No distress.  HENT:  Head: Normocephalic and atraumatic.  Eyes: Pupils are equal, round, and reactive to light.  Neck: Normal range of motion. Neck supple.  Cardiovascular: Normal rate and regular rhythm.   Pulmonary/Chest: Effort normal and breath sounds normal. She has no decreased breath sounds. She has no wheezes. She has no rhonchi. She exhibits tenderness. She exhibits no bony tenderness.    Abdominal: Soft. Bowel sounds are normal. She exhibits no distension. There is no tenderness. There is no rebound.  Musculoskeletal:  Mild lower extremity swelling.  Neurological: She is alert.  Skin: Skin is warm and dry.  Nursing note and vitals reviewed.   ED Course  Procedures (including critical care time) Labs Review Labs Reviewed  CBC - Abnormal; Notable for the following:    RBC 3.40 (*)    Hemoglobin 9.8 (*)    HCT 30.7 (*)    RDW 19.7 (*)    All other components within normal limits  BASIC METABOLIC PANEL - Abnormal; Notable for the following:    Potassium 2.7 (*)    Creatinine, Ser 0.32 (*)    Calcium 7.9 (*)    All other components within normal limits  HEPATIC FUNCTION PANEL - Abnormal; Notable for the following:    Total Protein 4.4 (*)    Albumin 2.5 (*)    Alkaline Phosphatase 38 (*)    All other  components within normal limits  PROTIME-INR - Abnormal; Notable for the following:    Prothrombin Time 37.5 (*)    INR 3.77 (*)    All other components within normal limits  URINE CULTURE  BRAIN NATRIURETIC PEPTIDE  URINALYSIS, ROUTINE W REFLEX MICROSCOPIC  I-STAT TROPOININ, ED    Imaging Review Ct Angio Chest Pe W/cm &/or Wo Cm  05/20/2014   CLINICAL DATA:  74 year old female with acute right chest pain day. Patient with metastatic breast cancer.  EXAM: CT ANGIOGRAPHY CHEST WITH CONTRAST  TECHNIQUE: Multidetector CT imaging of the chest was performed using the standard protocol during bolus administration of intravenous  contrast. Multiplanar CT image reconstructions and MIPs were obtained to evaluate the vascular anatomy.  CONTRAST:  41mL OMNIPAQUE IOHEXOL 350 MG/ML SOLN  COMPARISON:  03/25/2014 and 06/19/2013 CTs and chest radiographs.  FINDINGS: This is a technically satisfactory study.  No pulmonary emboli are identified. The previous noted right-sided pulmonary emboli on 03/25/2014 are no longer visible.  Cardiomegaly is again noted.  Borderline sized mediastinal lymph nodes are unchanged.  There is no evidence of thoracic aortic aneurysm or definite dissection.  Tiny bilateral pleural effusions are again noted. Left pleural thickening in the posterior costophrenic sulcus is again identified.  Right mastectomy and left-sided Port-A-Cath again identified.  Bilateral pulmonary nodules are again noted and unchanged in size. There has been further cavitation of a 2.1 x 1.6 cm right upper lobe nodule (images 20-27).  A stable 1.4 x 2.1 cm right lower lobe nodule (image 61) is identified.  No new or enlarging pulmonary nodules are identified.  Radiation changes along the anterior mid -lower right lung again identified.  No airspace disease or endotracheal mass identified.  No acute or suspicious bony abnormalities are identified.  The visualized upper abdomen is unremarkable.  Review of the MIP images  confirms the above findings.  IMPRESSION: No evidence of acute abnormality. No evidence of pulmonary emboli or thoracic aortic aneurysm/ definite dissection.  No significant change in pulmonary and pleural metastatic disease except for further cavitation of a right upper lobe nodule.   Electronically Signed   By: Margarette Canada M.D.   On: 05/20/2014 10:33     EKG Interpretation None      MDM   Final diagnoses:  SOB (shortness of breath)  Pulmonary embolism  Hypokalemia  Chest pain, unspecified chest pain type  Metastatic lung cancer (metastasis from lung to other site), right  Supratherapeutic INR    SOB and chest pain with history of PE: CBC, BMP, BNP, PT-INR, Troponin, Hepatic function panel, ECG, CXR, CT of chest. Pt is hypokalemic(2.7) but has no acute EKG changes.   The patient's CT angiogram of the chest is negative for significantly worsening metastasis and shows no evidence of pulmonary embolism. He has had a negative troponin. Her hemoglobin is mildly decreased, this may be due to the chemotherapy. . Negative BNP and her AST/ALT are WNL.  The patients potassium is 2.77. Her PT/INR is elevated at 3.77 which is supra therapeutic, she has not yet taken her Lovenox. The patient continues to endorse pain in her right lower chest, I suspect musculoskeletal, however due to the diarrhea, low potassium, supra therapeutic INR the patient will need admission. [facility] has agreed to admit, inpatient, TEle. Dr. Jeanell Sparrow has been made aware of patients chief complaint and that she has been admitted. - pt aware of plan and family members and pt are agreeable for admission.  Filed Vitals:   05/20/14 0945  BP: 126/60  Pulse: 88  Temp:   Resp: [address], PA-C 05/20/14 1106  Pattricia Boss, MD 05/21/14 1424

## 2014-05-20 NOTE — Progress Notes (Signed)
ANTICOAGULATION CONSULT NOTE - Initial Consult  Pharmacy Consult for Lovenox Indication: pulmonary embolus  Allergies  Allergen Reactions  . Penicillins Other (See Comments)    Childhood reaction  . Adhesive [Tape]   . Albuterol   . Codeine   . Ibuprofen Other (See Comments)    Induces patient asthma  . Meperidine     Other reaction(s): Other (See Comments) "wiped out" and nausea  . Other     Unknown narcotic  . Phenobarbital Other (See Comments)    Erratic behavior    Patient Measurements: Height: 5' 2.5" (158.8 cm) Weight: 168 lb (76.204 kg) IBW/kg (Calculated) : 51.25   Vital Signs: Temp: 98.5 F (36.9 C) (03/06 1142) Temp Source: Oral (03/06 1142) BP: 117/50 mmHg (03/06 1142) Pulse Rate: 95 (03/06 1142)  Labs:  Recent Labs  05/20/14 0850  HGB 9.8*  HCT 30.7*  PLT 278  LABPROT 37.5*  INR 3.77*  CREATININE 0.32*    Estimated Creatinine Clearance: 60.6 mL/min (by C-G formula based on Cr of 0.32).   Medical History: Past Medical History  Diagnosis Date  . Asthma   . Breast pain in female     throbbing  . Abdominal pain   . Breast lump in female   . Cancer     rt  . PE (pulmonary embolism) 03/26/2014  . Neuropathy   . GERD (gastroesophageal reflux disease)   . History of hiatal hernia     Medications:  Prescriptions prior to admission  Medication Sig Dispense Refill Last Dose  . amitriptyline (ELAVIL) 25 MG tablet Take 1 tablet by mouth at bedtime.   03/16/2014  . beclomethasone (QVAR) 40 MCG/ACT inhaler Inhale 2 puffs into the lungs 2 (two) times daily.    03/25/2014 at Unknown time  . Calcium Carbonate-Vitamin D (CALCIUM + D PO) Take 2 tablets by mouth daily.   03/24/2014 at Unknown time  . Glucosamine HCl-MSM 1500-500 MG/30ML LIQD Take 30 mLs by mouth daily.   03/24/2014 at Unknown time  . HYDROcodone-acetaminophen (NORCO/VICODIN) 5-325 MG per tablet Take 1 tablet by mouth every 4 (four) hours as needed for moderate pain. 30 tablet 0   .  hydroxypropyl methylcellulose (ISOPTO TEARS) 2.5 % ophthalmic solution Place 1 drop into both eyes as needed for dry eyes.   unknown  . ipratropium (ATROVENT HFA) 17 MCG/ACT inhaler Inhale 2 puffs into the lungs 4 (four) times daily as needed.   Past Week at Unknown time  . Multiple Vitamin (MULTIVITAMIN WITH MINERALS) TABS tablet Take 1 tablet by mouth daily.   03/24/2014 at Unknown time  . nitrofurantoin, macrocrystal-monohydrate, (MACROBID) 100 MG capsule Take 100 mg by mouth 2 (two) times daily.   03/25/2014 at Unknown time  . potassium chloride SA (K-DUR,KLOR-CON) 20 MEQ tablet Take 20 mEq by mouth 2 (two) times daily.   03/25/2014 at Unknown time  . ranitidine (ZANTAC) 150 MG tablet Take 150 mg by mouth 2 (two) times daily.   03/25/2014 at Unknown time  . Rivaroxaban (XARELTO STARTER PACK) 15 & 20 MG TBPK Take as directed on package: Start with one 15mg  tablet by mouth twice a day with food. On Day 22, switch to one 20mg  tablet once a day with food. 56 each 0     Assessment: 74 y.o. female, with a history of breast cancer with metastases to the lungs show diagnosed in 2012 (followed at [facility]) on palliative chemotherapy, recently diagnosed with pulmonary embolism January 2016 recently changed to Lovenox by her oncologist. Her  dose is 60mg  SQ q12h.  CTA did not show any new Pulmonary emboli.  Will continue current regimen.  If warranted can check anti XA level.   Goal of Therapy:  Anti-Xa level 0.6-1 units/ml 4hrs after LMWH dose given  Monitor platelets by anticoagulation protocol: Yes   Plan:  Lovenox 60mg  SQ q12h Monitor CBC and any S/S bleeding  Trenton Gammon, Lamerle Jabs L 05/20/2014,12:40 PM

## 2014-05-20 NOTE — H&P (Signed)
[facility] History and Physical                                                                                    [name] [name], is a 74 y.o. female  MRN: [id number]   DOB - [birth year]  Admit Date - 05/20/2014  Outpatient Primary MD for the patient is MCNEILL,WENDY, MD  With History of -  Past Medical History  Diagnosis Date  . Asthma   . Breast pain in female     throbbing  . Abdominal pain   . Breast lump in female   . Cancer     rt  . PE (pulmonary embolism) 03/26/2014  . Neuropathy   . GERD (gastroesophageal reflux disease)   . History of hiatal hernia       Past Surgical History  Procedure Laterality Date  . Colonoscopy w/ polypectomy  12/11, 4/12  . Tonsillectomy    . Skin cancer excision  2011  . Bone density  2011  . Mastectomy      right july 2012  . Breast surgery      Right Mastectomy  . Lung biopsy  08/2013    in for   Chief Complaint  Patient presents with  . Shortness of Breath     HPI [name] [name]  is a 74 y.o. female, with a history of breast cancer with metastases to the lungs show diagnosed in 2012 (followed at [facility]) on palliative chemotherapy, recently diagnosed with pulmonary embolism January 2016 recently changed to Lovenox by her oncologist, chronic diarrhea with associated chronic hypokalemia on potassium replacement at home. She presents to the ER with complaints of chest pain that increased with taking a deep breath. Because of her history of PE a CTA of the chest was obtained that did not show any new pulmonary emboli. Her initial troponin was negative at 0.01. BNP normal at 92.47. Potassium was low at 2.7. Renal function at baseline. Alkaline phosphatase low at 38. Albumin low at 2.5. No leukocytosis. Hemoglobin stable and at baseline. PT 37.5 and INR 3.77. Glucose 81. EKG revealed sinus rhythm with short PR interval and nonspecific ST-T wave abnormality.  Upon my examination patient reports that she is had constant pain  above the right breast that increases with breathing. She's not noticed any shortness of breath, has had no nausea or diaphoresis with this discomfort. She's not had any fevers or chills or cough. She has chronic loose stools. She denies urinary symptoms. She denies any problems eating or drinking but her husband then interjected reported that over the past 24 hours her intake is decreased. She does have some chronic stable bilateral ankle swelling. She has a new rash on her upper back and somewhat on the face; states this rash does not itch. Further questioning revealed that the patient had increased her physical activity passed her baseline; on 3/5 she had helped some people move and had been picking up boxes which is not normal for her.  Review of Systems   In addition to the HPI above,  No Fever-chills, myalgias or other constitutional symptoms No Headache, changes with Vision or hearing, new weakness, tingling, numbness in  any extremity, No problems swallowing food or Liquids, indigestion/reflux No Chest pain, Cough or Shortness of Breath, palpitations, orthopnea or DOE No Abdominal pain, N/V; no melena or hematochezia, no dark tarry stools, Bowel movements are regular, No dysuria, hematuria or flank pain No new skin rashes, lesions, masses or bruises, No new joints pains-aches No recent weight gain or loss No polyuria, polydypsia or polyphagia,  *A full 10 point Review of Systems was done, except as stated above, all other Review of Systems were negative.  Social History History  Substance Use Topics  . Smoking status: Never Smoker   . Smokeless tobacco: Never Used  . Alcohol Use: No    Family History History reviewed. No pertinent family history.  Prior to Admission medications   Medication Sig Start Date End Date Taking? Authorizing Provider  amitriptyline (ELAVIL) 25 MG tablet Take 1 tablet by mouth at bedtime. 03/21/14 03/21/15  Historical Provider, MD  beclomethasone (QVAR) 40  MCG/ACT inhaler Inhale 2 puffs into the lungs 2 (two) times daily.     Historical Provider, MD  Calcium Carbonate-Vitamin D (CALCIUM + D PO) Take 2 tablets by mouth daily.    Historical Provider, MD  Glucosamine HCl-MSM 1500-500 MG/30ML LIQD Take 30 mLs by mouth daily.    Historical Provider, MD  HYDROcodone-acetaminophen (NORCO/VICODIN) 5-325 MG per tablet Take 1 tablet by mouth every 4 (four) hours as needed for moderate pain. 03/27/14   Geradine Girt, DO  hydroxypropyl methylcellulose (ISOPTO TEARS) 2.5 % ophthalmic solution Place 1 drop into both eyes as needed for dry eyes.    Historical Provider, MD  ipratropium (ATROVENT HFA) 17 MCG/ACT inhaler Inhale 2 puffs into the lungs 4 (four) times daily as needed. 02/28/14 02/28/15  Historical Provider, MD  Multiple Vitamin (MULTIVITAMIN WITH MINERALS) TABS tablet Take 1 tablet by mouth daily.    Historical Provider, MD  nitrofurantoin, macrocrystal-monohydrate, (MACROBID) 100 MG capsule Take 100 mg by mouth 2 (two) times daily.    Historical Provider, MD  potassium chloride SA (K-DUR,KLOR-CON) 20 MEQ tablet Take 20 mEq by mouth 2 (two) times daily.    Historical Provider, MD  ranitidine (ZANTAC) 150 MG tablet Take 150 mg by mouth 2 (two) times daily.    Historical Provider, MD  Rivaroxaban (XARELTO STARTER PACK) 15 & 20 MG TBPK Take as directed on package: Start with one 15mg  tablet by mouth twice a day with food. On Day 22, switch to one 20mg  tablet once a day with food. 03/27/14   Geradine Girt, DO    Allergies  Allergen Reactions  . Penicillins Other (See Comments)    Childhood reaction  . Adhesive [Tape]   . Albuterol   . Codeine   . Ibuprofen Other (See Comments)    Induces patient asthma  . Meperidine     Other reaction(s): Other (See Comments) "wiped out" and nausea  . Other     Unknown narcotic  . Phenobarbital Other (See Comments)    Erratic behavior    Physical Exam  Vitals  Blood pressure 126/60, pulse 88, temperature  98.3 F (36.8 C), temperature source Oral, resp. rate 19, height 5' 2.5" (1.588 m), weight 168 lb (76.204 kg), SpO2 100 %.   General:  In no acute distress, appears healthy and well nourished  Psych:  Normal affect, Denies Suicidal or Homicidal ideations, Awake Alert, Oriented X 3. Speech and thought patterns are clear and appropriate, no apparent short term memory deficits  Neuro:   No focal neurological deficits,  CN II through XII intact, Strength 5/5 all 4 extremities, Sensation intact all 4 extremities.  ENT:  Ears and Eyes appear Normal, Conjunctivae clear, PER. Moist oral mucosa without erythema or exudates.  Neck:  Supple, No lymphadenopathy appreciated  Respiratory:  Symmetrical chest wall movement, Good air movement bilaterally, CTAB. Room Air, pain reproducible with palpation over right anterior chest wall  Cardiac:  RRR, No Murmurs, no LE edema noted, no JVD, No carotid bruits, peripheral pulses palpable at 2+  Abdomen:  Positive bowel sounds, Soft, Non tender, Non distended,  No masses appreciated, no obvious hepatosplenomegaly  Skin:  No Cyanosis, Normal Skin Turgor, No Bruise. Patient does have reddened macular rash confluent over her upper back and shoulder area and to a lesser extent a few areas that appear similar around the face and neck  Extremities: Symmetrical without obvious trauma or injury,  no effusions.  Data Review  CBC  Recent Labs Lab 05/20/14 0850  WBC 4.4  HGB 9.8*  HCT 30.7*  PLT 278  MCV 90.3  MCH 28.8  MCHC 31.9  RDW 19.7*    Chemistries   Recent Labs Lab 05/20/14 0850  NA 141  K 2.7*  CL 107  CO2 25  GLUCOSE 81  BUN 11  CREATININE 0.32*  CALCIUM 7.9*  AST 16  ALT 17  ALKPHOS 38*  BILITOT 0.4    estimated creatinine clearance is 60.6 mL/min (by C-G formula based on Cr of 0.32).  No results for input(s): TSH, T4TOTAL, T3FREE, THYROIDAB in the last 72 hours.  Invalid input(s): FREET3  Coagulation profile  Recent  Labs Lab 05/20/14 0850  INR 3.77*    No results for input(s): DDIMER in the last 72 hours.  Cardiac Enzymes No results for input(s): CKMB, TROPONINI, MYOGLOBIN in the last 168 hours.  Invalid input(s): CK  Invalid input(s): POCBNP  Urinalysis No results found for: COLORURINE, APPEARANCEUR, LABSPEC, PHURINE, GLUCOSEU, HGBUR, BILIRUBINUR, KETONESUR, PROTEINUR, UROBILINOGEN, NITRITE, LEUKOCYTESUR  Imaging results:   Ct Angio Chest Pe W/cm &/or Wo Cm  05/20/2014   CLINICAL DATA:  74 year old female with acute right chest pain day. Patient with metastatic breast cancer.  EXAM: CT ANGIOGRAPHY CHEST WITH CONTRAST  TECHNIQUE: Multidetector CT imaging of the chest was performed using the standard protocol during bolus administration of intravenous contrast. Multiplanar CT image reconstructions and MIPs were obtained to evaluate the vascular anatomy.  CONTRAST:  71mL OMNIPAQUE IOHEXOL 350 MG/ML SOLN  COMPARISON:  03/25/2014 and 06/19/2013 CTs and chest radiographs.  FINDINGS: This is a technically satisfactory study.  No pulmonary emboli are identified. The previous noted right-sided pulmonary emboli on 03/25/2014 are no longer visible.  Cardiomegaly is again noted.  Borderline sized mediastinal lymph nodes are unchanged.  There is no evidence of thoracic aortic aneurysm or definite dissection.  Tiny bilateral pleural effusions are again noted. Left pleural thickening in the posterior costophrenic sulcus is again identified.  Right mastectomy and left-sided Port-A-Cath again identified.  Bilateral pulmonary nodules are again noted and unchanged in size. There has been further cavitation of a 2.1 x 1.6 cm right upper lobe nodule (images 20-27).  A stable 1.4 x 2.1 cm right lower lobe nodule (image 61) is identified.  No new or enlarging pulmonary nodules are identified.  Radiation changes along the anterior mid -lower right lung again identified.  No airspace disease or endotracheal mass identified.  No  acute or suspicious bony abnormalities are identified.  The visualized upper abdomen is unremarkable.  Review of the MIP  images confirms the above findings.  IMPRESSION: No evidence of acute abnormality. No evidence of pulmonary emboli or thoracic aortic aneurysm/ definite dissection.  No significant change in pulmonary and pleural metastatic disease except for further cavitation of a right upper lobe nodule.   Electronically Signed   By: Margarette Canada M.D.   On: 05/20/2014 10:33     EKG: Sinus rhythm, short PR interval, nonspecific ST-T wave changes   Assessment & Plan  Active Problems:   Chest pain on breathing -Admit to telemetry/observation status -Suspect primarily chest wall pain given location and history of recent exertional activity involving picking up heavier than usual objects -Cycle cardiac enzymes -Suspect chronic deconditioning related to her breast cancer as well as chronic hypokalemia contributing to ease of muscle strain    Hypokalemia/Chronic diarrhea -Continue preadmission twice a day potassium -Was given 40 mEq of potassium in the ER and will give another 40 mEq on top of her usual home dose since potassium low at 2.7 -May need to consider increasing her potassium dosage prior to discharge -Since immunocompromised even though diarrhea is chronic as a precaution we'll check for C. Difficile -Since poor oral intake for at least 24 hours we'll continue IV fluids -Patient does not have white count or any urinary symptoms but has precautions and she is in no compromise will check urinalysis and culture    History of pulmonary embolism -CT chest this admission without recurrence or new areas -Continue previous Lovenox    Asthma -Currently asymptomatic    Rash of back -Appears consistent with contact dermatitis especially given patient's history of asthma and multiple drug allergies -Patient denies any new foods, perfumes soaps etc. -She was helping people move yesterday  and the rash has only been present for 24 hours so she may have had contact with unknown substance that time -Monitor    Breast cancer -On palliative chemotherapy and followed at [facility]    Anemia -Likely related to underlying malignancy and malnutrition    DVT Prophylaxis: Xarelto  Family Communication:   Husband and other family members at bedside with patient's permission  Code Status: Full code   Condition:  Stable  Time spent in minutes : 60   ELLIS,ALLISON L. ANP on 05/20/2014 at 11:02 AM  Between 7am to 7pm - Pager - [phone]  After 7pm go to [email or web address] - password [email or web address]  And look for the night coverage person covering me after hours  [facility]

## 2014-05-21 DIAGNOSIS — R0781 Pleurodynia: Secondary | ICD-10-CM | POA: Diagnosis not present

## 2014-05-21 DIAGNOSIS — R071 Chest pain on breathing: Secondary | ICD-10-CM | POA: Diagnosis not present

## 2014-05-21 DIAGNOSIS — R079 Chest pain, unspecified: Secondary | ICD-10-CM

## 2014-05-21 DIAGNOSIS — K529 Noninfective gastroenteritis and colitis, unspecified: Secondary | ICD-10-CM | POA: Diagnosis not present

## 2014-05-21 LAB — BASIC METABOLIC PANEL
Anion gap: 4 — ABNORMAL LOW (ref 5–15)
BUN: 9 mg/dL (ref 6–23)
CO2: 30 mmol/L (ref 19–32)
Calcium: 8 mg/dL — ABNORMAL LOW (ref 8.4–10.5)
Chloride: 104 mmol/L (ref 96–112)
Creatinine, Ser: 0.45 mg/dL — ABNORMAL LOW (ref 0.50–1.10)
GFR calc Af Amer: >90 mL/min (ref >90)
GFR calc non Af Amer: >90 mL/min (ref >90)
Glucose, Bld: 102 mg/dL — ABNORMAL HIGH (ref 70–99)
Potassium: 3.8 mmol/L (ref 3.5–5.1)
Sodium: 138 mmol/L (ref 135–145)

## 2014-05-21 LAB — URINE CULTURE

## 2014-05-21 LAB — TROPONIN I

## 2014-05-21 MED ORDER — UREA 10 % EX CREA: TOPICAL_CREAM | CUTANEOUS | Status: DC | PRN

## 2014-05-21 MED ORDER — POTASSIUM CHLORIDE CRYS ER 20 MEQ PO TBCR: 20.0000 meq | EXTENDED_RELEASE_TABLET | Freq: Three times a day (TID) | ORAL | Status: DC

## 2014-05-21 MED ORDER — HEPARIN SOD (PORK) LOCK FLUSH 100 UNIT/ML IV SOLN
500.0000 [IU] | INTRAVENOUS | Status: DC
  Administered 2014-05-21: 500 [IU]

## 2014-05-21 MED ORDER — CLOTRIMAZOLE 1 % EX CREA
TOPICAL_CREAM | Freq: Two times a day (BID) | CUTANEOUS | Status: DC
  Administered 2014-05-21: 14:00:00 via TOPICAL
  Filled 2014-05-21: qty 15

## 2014-05-21 MED ORDER — HEPARIN SOD (PORK) LOCK FLUSH 100 UNIT/ML IV SOLN
500.0000 [IU] | INTRAVENOUS | Status: DC | PRN
  Filled 2014-05-21: qty 5

## 2014-05-21 MED ORDER — CLOTRIMAZOLE 1 % EX CREA: TOPICAL_CREAM | Freq: Two times a day (BID) | CUTANEOUS | Status: DC

## 2014-05-21 NOTE — Discharge Summary (Signed)
Physician Discharge Summary  [name] [name]:[id number] DOB: 1941-02-24 DOA: 05/20/2014  PCP: Cari Caraway, MD  Admit date: 05/20/2014 Discharge date: 05/21/2014  Time spent: 35 minutes  Recommendations for Outpatient Follow-up:  Follow up with Dr Elzie Rings for further care of breast cancer and complication related to chemo.  Need B-met to follow up K level.   Discharge Diagnoses:    Pleuritic Chest pain.    Hand , foot syndrome from Chemo/    Breast cancer   History of pulmonary embolism   Chest pain on breathing   Asthma   Hypokalemia   Chronic diarrhea   Rash of back   Anemia   Discharge Condition: Stable.   Diet recommendation: Herat healthy  Filed Weights   05/20/14 0818 05/20/14 1142  Weight: 76.204 kg (168 lb) 76.204 kg (168 lb)    History of present illness:  [name] [name] is a 74 y.o. female, with a history of breast cancer with metastases to the lungs show diagnosed in 2012 (followed at [facility]) on palliative chemotherapy, recently diagnosed with pulmonary embolism January 2016 recently changed to Lovenox by her oncologist, chronic diarrhea with associated chronic hypokalemia on potassium replacement at home. She presents to the ER with complaints of chest pain that increased with taking a deep breath. Because of her history of PE a CTA of the chest was obtained that did not show any new pulmonary emboli. Her initial troponin was negative at 0.01. BNP normal at 92.47. Potassium was low at 2.7. Renal function at baseline. Alkaline phosphatase low at 38. Albumin low at 2.5. No leukocytosis. Hemoglobin stable and at baseline. PT 37.5 and INR 3.77. Glucose 81. EKG revealed sinus rhythm with short PR interval and nonspecific ST-T wave abnormality.  Upon my examination patient reports that she is had constant pain above the right breast that increases with breathing. She's not noticed any shortness of breath, has had no nausea or diaphoresis with this discomfort. She's  not had any fevers or chills or cough. She has chronic loose stools. She denies urinary symptoms. She denies any problems eating or drinking but her husband then interjected reported that over the past 24 hours her intake is decreased. She does have some chronic stable bilateral ankle swelling. She has a new rash on her upper back and somewhat on the face; states this rash does not itch. Further questioning revealed that the patient had increased her physical activity passed her baseline; on 3/5 she had helped some people move and had been picking up boxes which is not normal for her.  Hospital Course:  Chest pain on breathing -Admit to telemetry/observation status -Suspect primarily chest wall pain given location and history of recent exertional activity involving picking up heavier than usual objects ECHO normal EF.  Troponin times 2 negative.  Ct angio negative for PE.   Hypokalemia/Chronic diarrhea -Continue preadmission twice a day potassium -K level better at 3.8. -Will increase potassium supplement to 20 meq TID.   Hands swelling, redness palm;  Discussed with Dr Elzie Rings, could be related to Doxorubicin.  Will prescribe urea cream 10 %.   History of pulmonary embolism -CT chest this admission without recurrence or new areas -Continue previous Lovenox  Asthma -Currently asymptomatic  Axillary Rash  -will try clotrimazole cream.   Breast cancer -On palliative chemotherapy and followed at [facility] -lung nodule , cavitary on CT . Follow up with Dr Elzie Rings.   Anemia -Likely related to underlying malignancy and malnutrition  Procedures: ECHO; Normal biventricular size and  function. Trivial mitral, tricuspid and pulmonic regurgitation. Normal RVSP. Consultations:  none  Discharge Exam: Filed Vitals:   05/21/14 0359  BP: 116/51  Pulse: 109  Temp: 99.6 F (37.6 C)  Resp: 16    General: Alert in no distress.  Cardiovascular: S 1, S 2 RRR Respiratory:  CTA Extremities; Palm hands with redness, mild swelling.   Discharge Instructions    Current Discharge Medication List    CONTINUE these medications which have NOT CHANGED   Details  Calcium Carbonate-Vitamin D (CALCIUM + D PO) Take 2 tablets by mouth daily.    enoxaparin (LOVENOX) 60 MG/0.6ML injection Inject 60 mg into the skin every 12 (twelve) hours.     Glucosamine HCl-MSM 1500-500 MG/30ML LIQD Take 30 mLs by mouth daily.    lidocaine-prilocaine (EMLA) cream Apply 1 application topically once.     Multiple Vitamin (MULTIVITAMIN WITH MINERALS) TABS tablet Take 1 tablet by mouth daily.    ondansetron (ZOFRAN) 4 MG tablet Take 4 mg by mouth every 8 (eight) hours as needed for nausea.     potassium chloride SA (K-DUR,KLOR-CON) 20 MEQ tablet Take 20 mEq by mouth 2 (two) times daily.    predniSONE (DELTASONE) 10 MG tablet Take 10 mg by mouth 2 (two) times daily.     ranitidine (ZANTAC) 150 MG tablet Take 150 mg by mouth 2 (two) times daily.    beclomethasone (QVAR) 40 MCG/ACT inhaler Inhale 2 puffs into the lungs 2 (two) times daily as needed.     hydroxypropyl methylcellulose (ISOPTO TEARS) 2.5 % ophthalmic solution Place 1 drop into both eyes as needed for dry eyes.    ipratropium (ATROVENT HFA) 17 MCG/ACT inhaler Inhale 2 puffs into the lungs 4 (four) times daily as needed.    loratadine (CLARITIN REDITABS) 10 MG dissolvable tablet Take 10 mg by mouth daily.       Allergies  Allergen Reactions  . Albuterol Shortness Of Breath    Tachycardia, feels like she is going to die  . Penicillins Other (See Comments)    Childhood reaction  . Codeine Nausea Only and Other (See Comments)    woosy  . Ibuprofen Other (See Comments)    Induces patient asthma  . Meperidine Nausea Only    Other reaction(s): Other (See Comments) "wiped out" and nausea  . Other     Unknown narcotic  . Phenobarbital Other (See Comments)    Erratic behavior, hyper, jittery  . Adhesive [Tape] Rash    Follow-up Information    Follow up with MCNEILL,WENDY, MD In 1 week.   Specialty:  Family Medicine   Contact information:   [place] [address] [phone]        The results of significant diagnostics from this hospitalization (including imaging, microbiology, ancillary and laboratory) are listed below for reference.    Significant Diagnostic Studies: Dg Chest 2 View  05/20/2014   CLINICAL DATA:  Shortness of breath. Right-sided chest pain. Metastatic breast carcinoma.  EXAM: CHEST  2 VIEW  COMPARISON:  03/25/2014  FINDINGS: Heart size remains within normal limits. Left-sided Port-A-Cath remains in place. Bilateral pulmonary nodular opacities again seen, consistent with known pulmonary metastases. No evidence of pulmonary consolidation. Tiny right pleural effusion again noted posteriorly.  IMPRESSION: Bilateral pulmonary metastases and tiny right pleural effusion again noted. No acute findings.   Electronically Signed   By: Earle Gell M.D.   On: 05/20/2014 11:15   Ct Angio Chest Pe W/cm &/or Wo Cm  05/20/2014  CLINICAL DATA:  74 year old female with acute right chest pain day. Patient with metastatic breast cancer.  EXAM: CT ANGIOGRAPHY CHEST WITH CONTRAST  TECHNIQUE: Multidetector CT imaging of the chest was performed using the standard protocol during bolus administration of intravenous contrast. Multiplanar CT image reconstructions and MIPs were obtained to evaluate the vascular anatomy.  CONTRAST:  24mL OMNIPAQUE IOHEXOL 350 MG/ML SOLN  COMPARISON:  03/25/2014 and 06/19/2013 CTs and chest radiographs.  FINDINGS: This is a technically satisfactory study.  No pulmonary emboli are identified. The previous noted right-sided pulmonary emboli on 03/25/2014 are no longer visible.  Cardiomegaly is again noted.  Borderline sized mediastinal lymph nodes are unchanged.  There is no evidence of thoracic aortic aneurysm or definite dissection.  Tiny bilateral pleural effusions are  again noted. Left pleural thickening in the posterior costophrenic sulcus is again identified.  Right mastectomy and left-sided Port-A-Cath again identified.  Bilateral pulmonary nodules are again noted and unchanged in size. There has been further cavitation of a 2.1 x 1.6 cm right upper lobe nodule (images 20-27).  A stable 1.4 x 2.1 cm right lower lobe nodule (image 61) is identified.  No new or enlarging pulmonary nodules are identified.  Radiation changes along the anterior mid -lower right lung again identified.  No airspace disease or endotracheal mass identified.  No acute or suspicious bony abnormalities are identified.  The visualized upper abdomen is unremarkable.  Review of the MIP images confirms the above findings.  IMPRESSION: No evidence of acute abnormality. No evidence of pulmonary emboli or thoracic aortic aneurysm/ definite dissection.  No significant change in pulmonary and pleural metastatic disease except for further cavitation of a right upper lobe nodule.   Electronically Signed   By: Margarette Canada M.D.   On: 05/20/2014 10:33    Microbiology: Recent Results (from the past 240 hour(s))  Clostridium Difficile by PCR     Status: None   Collection Time: 05/20/14  3:42 PM  Result Value Ref Range Status   C difficile by pcr NEGATIVE NEGATIVE Final     Labs: Basic Metabolic Panel:  Recent Labs Lab 05/20/14 0850 05/21/14 0800  NA 141 138  K 2.7* 3.8  CL 107 104  CO2 25 30  GLUCOSE 81 102*  BUN 11 9  CREATININE 0.32* 0.45*  CALCIUM 7.9* 8.0*   Liver Function Tests:  Recent Labs Lab 05/20/14 0850  AST 16  ALT 17  ALKPHOS 38*  BILITOT 0.4  PROT 4.4*  ALBUMIN 2.5*   No results for input(s): LIPASE, AMYLASE in the last 168 hours. No results for input(s): AMMONIA in the last 168 hours. CBC:  Recent Labs Lab 05/20/14 0850  WBC 4.4  HGB 9.8*  HCT 30.7*  MCV 90.3  PLT 278   Cardiac Enzymes:  Recent Labs Lab 05/20/14 1420 05/20/14 2130  TROPONINI <0.03  <0.03   BNP: BNP (last 3 results)  Recent Labs  05/20/14 0850  BNP 92.7    ProBNP (last 3 results) No results for input(s): PROBNP in the last 8760 hours.  CBG: No results for input(s): GLUCAP in the last 168 hours.     Signed:  Niel Hummer A  [facility] 05/21/2014, 9:58 AM

## 2014-05-21 NOTE — Progress Notes (Signed)
Pt requesting information regarding poss d/c home today; MD paged; will await callback.

## 2014-05-21 NOTE — Progress Notes (Signed)
  Echocardiogram 2D Echocardiogram has been performed.  Bobbye Charleston 05/21/2014, 12:47 PM

## 2014-05-21 NOTE — Progress Notes (Signed)
Pt to d/c home with husband; pt and husband given d/c instructions, follow up appointment information; and prescriptions; pt and husband verbalized understanding; tele monitor d/c at this time; will cont. To monitor.

## 2014-05-21 NOTE — Progress Notes (Signed)
UR completed 

## 2014-07-03 ENCOUNTER — Emergency Department (HOSPITAL_COMMUNITY): Payer: Medicare Other

## 2014-07-03 ENCOUNTER — Inpatient Hospital Stay (HOSPITAL_COMMUNITY)
Admission: EM | Admit: 2014-07-03 | Discharge: 2014-07-05 | DRG: 871 | Disposition: A | Discharge status: Other Acute Inpatient Hospital | Payer: Medicare Other | Attending: Internal Medicine | Admitting: Internal Medicine

## 2014-07-03 ENCOUNTER — Encounter (HOSPITAL_COMMUNITY): Payer: Self-pay | Admitting: Neurology

## 2014-07-03 DIAGNOSIS — A419 Sepsis, unspecified organism: Secondary | ICD-10-CM | POA: Diagnosis present

## 2014-07-03 DIAGNOSIS — R229 Localized swelling, mass and lump, unspecified: Secondary | ICD-10-CM | POA: Diagnosis not present

## 2014-07-03 DIAGNOSIS — J45909 Unspecified asthma, uncomplicated: Secondary | ICD-10-CM | POA: Diagnosis present

## 2014-07-03 DIAGNOSIS — N390 Urinary tract infection, site not specified: Secondary | ICD-10-CM | POA: Diagnosis present

## 2014-07-03 DIAGNOSIS — R509 Fever, unspecified: Secondary | ICD-10-CM | POA: Diagnosis not present

## 2014-07-03 DIAGNOSIS — G629 Polyneuropathy, unspecified: Secondary | ICD-10-CM | POA: Diagnosis present

## 2014-07-03 DIAGNOSIS — J189 Pneumonia, unspecified organism: Secondary | ICD-10-CM | POA: Diagnosis present

## 2014-07-03 DIAGNOSIS — R502 Drug induced fever: Secondary | ICD-10-CM | POA: Diagnosis not present

## 2014-07-03 DIAGNOSIS — K219 Gastro-esophageal reflux disease without esophagitis: Secondary | ICD-10-CM | POA: Diagnosis not present

## 2014-07-03 DIAGNOSIS — Z79899 Other long term (current) drug therapy: Secondary | ICD-10-CM | POA: Diagnosis not present

## 2014-07-03 DIAGNOSIS — C7800 Secondary malignant neoplasm of unspecified lung: Secondary | ICD-10-CM | POA: Diagnosis not present

## 2014-07-03 DIAGNOSIS — C50919 Malignant neoplasm of unspecified site of unspecified female breast: Secondary | ICD-10-CM | POA: Diagnosis present

## 2014-07-03 DIAGNOSIS — Y95 Nosocomial condition: Secondary | ICD-10-CM | POA: Diagnosis not present

## 2014-07-03 DIAGNOSIS — E2740 Unspecified adrenocortical insufficiency: Secondary | ICD-10-CM | POA: Diagnosis not present

## 2014-07-03 DIAGNOSIS — Z85828 Personal history of other malignant neoplasm of skin: Secondary | ICD-10-CM

## 2014-07-03 DIAGNOSIS — Z7901 Long term (current) use of anticoagulants: Secondary | ICD-10-CM

## 2014-07-03 DIAGNOSIS — C782 Secondary malignant neoplasm of pleura: Secondary | ICD-10-CM | POA: Diagnosis not present

## 2014-07-03 DIAGNOSIS — J029 Acute pharyngitis, unspecified: Secondary | ICD-10-CM | POA: Diagnosis not present

## 2014-07-03 DIAGNOSIS — Z86711 Personal history of pulmonary embolism: Secondary | ICD-10-CM

## 2014-07-03 LAB — URINALYSIS, ROUTINE W REFLEX MICROSCOPIC
Bilirubin Urine: NEGATIVE
Glucose, UA: NEGATIVE mg/dL
Hgb urine dipstick: NEGATIVE
KETONES UR: NEGATIVE mg/dL
NITRITE: NEGATIVE
PH: 6.5 (ref 5.0–8.0)
Protein, ur: NEGATIVE mg/dL
Specific Gravity, Urine: 1.015 (ref 1.005–1.030)
UROBILINOGEN UA: 0.2 mg/dL (ref 0.0–1.0)

## 2014-07-03 LAB — I-STAT CG4 LACTIC ACID, ED: LACTIC ACID, VENOUS: 1.73 mmol/L (ref 0.5–2.0)

## 2014-07-03 LAB — CBC WITH DIFFERENTIAL/PLATELET
Basophils Absolute: 0 10*3/uL (ref 0.0–0.1)
Basophils Relative: 1 % (ref 0–1)
Eosinophils Absolute: 0 10*3/uL (ref 0.0–0.7)
Eosinophils Relative: 0 % (ref 0–5)
HCT: 33.9 % — ABNORMAL LOW (ref 36.0–46.0)
Hemoglobin: 10.9 g/dL — ABNORMAL LOW (ref 12.0–15.0)
LYMPHS ABS: 1.5 10*3/uL (ref 0.7–4.0)
Lymphocytes Relative: 26 % (ref 12–46)
MCH: 29.7 pg (ref 26.0–34.0)
MCHC: 32.2 g/dL (ref 30.0–36.0)
MCV: 92.4 fL (ref 78.0–100.0)
Monocytes Absolute: 1.2 10*3/uL — ABNORMAL HIGH (ref 0.1–1.0)
Monocytes Relative: 20 % — ABNORMAL HIGH (ref 3–12)
NEUTROS PCT: 53 % (ref 43–77)
Neutro Abs: 3.2 10*3/uL (ref 1.7–7.7)
PLATELETS: 295 10*3/uL (ref 150–400)
RBC: 3.67 MIL/uL — AB (ref 3.87–5.11)
RDW: 17.5 % — ABNORMAL HIGH (ref 11.5–15.5)
WBC: 6 10*3/uL (ref 4.0–10.5)

## 2014-07-03 LAB — COMPREHENSIVE METABOLIC PANEL
ALBUMIN: 2.9 g/dL — AB (ref 3.5–5.2)
ALT: 13 U/L (ref 0–35)
ANION GAP: 10 (ref 5–15)
AST: 17 U/L (ref 0–37)
Alkaline Phosphatase: 55 U/L (ref 39–117)
BUN: 9 mg/dL (ref 6–23)
CALCIUM: 8.7 mg/dL (ref 8.4–10.5)
CO2: 29 mmol/L (ref 19–32)
CREATININE: 0.56 mg/dL (ref 0.50–1.10)
Chloride: 97 mmol/L (ref 96–112)
GFR calc Af Amer: >90 mL/min (ref >90)
GFR calc non Af Amer: >90 mL/min (ref >90)
Glucose, Bld: 107 mg/dL — ABNORMAL HIGH (ref 70–99)
Potassium: 3.2 mmol/L — ABNORMAL LOW (ref 3.5–5.1)
Sodium: 136 mmol/L (ref 135–145)
TOTAL PROTEIN: 5.6 g/dL — AB (ref 6.0–8.3)
Total Bilirubin: 0.6 mg/dL (ref 0.3–1.2)

## 2014-07-03 LAB — LACTIC ACID, PLASMA: LACTIC ACID, VENOUS: 1.6 mmol/L (ref 0.5–2.0)

## 2014-07-03 LAB — URINE MICROSCOPIC-ADD ON

## 2014-07-03 LAB — STREP PNEUMONIAE URINARY ANTIGEN: STREP PNEUMO URINARY ANTIGEN: NEGATIVE

## 2014-07-03 LAB — MRSA PCR SCREENING: MRSA by PCR: NEGATIVE

## 2014-07-03 MED ORDER — IOHEXOL 300 MG/ML  SOLN
80.0000 mL | Freq: Once | INTRAMUSCULAR | Status: AC | PRN
  Administered 2014-07-03: 80 mL via INTRAVENOUS

## 2014-07-03 MED ORDER — AZTREONAM 1 G IJ SOLR
1.0000 g | Freq: Three times a day (TID) | INTRAMUSCULAR | Status: DC
  Filled 2014-07-03 (×2): qty 1

## 2014-07-03 MED ORDER — SODIUM CHLORIDE 0.9 % IV BOLUS (SEPSIS)
1000.0000 mL | INTRAVENOUS | Status: AC
  Administered 2014-07-03: 1000 mL via INTRAVENOUS

## 2014-07-03 MED ORDER — SODIUM CHLORIDE 0.9 % IJ SOLN
3.0000 mL | Freq: Two times a day (BID) | INTRAMUSCULAR | Status: DC
  Administered 2014-07-03: 3 mL via INTRAVENOUS

## 2014-07-03 MED ORDER — ONDANSETRON HCL 4 MG/2ML IJ SOLN: 4.0000 mg | Freq: Four times a day (QID) | INTRAMUSCULAR | Status: DC | PRN

## 2014-07-03 MED ORDER — ONDANSETRON HCL 4 MG PO TABS: 4.0000 mg | ORAL_TABLET | Freq: Four times a day (QID) | ORAL | Status: DC | PRN

## 2014-07-03 MED ORDER — ACETAMINOPHEN 650 MG RE SUPP: 650.0000 mg | Freq: Four times a day (QID) | RECTAL | Status: DC | PRN

## 2014-07-03 MED ORDER — VANCOMYCIN HCL IN DEXTROSE 1-5 GM/200ML-% IV SOLN
1000.0000 mg | Freq: Once | INTRAVENOUS | Status: AC
  Administered 2014-07-03: 1000 mg via INTRAVENOUS
  Filled 2014-07-03 (×2): qty 200

## 2014-07-03 MED ORDER — PREDNISONE 20 MG PO TABS
30.0000 mg | ORAL_TABLET | Freq: Every day | ORAL | Status: DC
  Administered 2014-07-04 – 2014-07-05 (×2): 30 mg via ORAL
  Filled 2014-07-03 (×4): qty 1

## 2014-07-03 MED ORDER — IPRATROPIUM BROMIDE 0.02 % IN SOLN
2.5000 mL | RESPIRATORY_TRACT | Status: DC | PRN
  Administered 2014-07-05 (×2): 0.5 mg via RESPIRATORY_TRACT
  Filled 2014-07-03 (×2): qty 2.5

## 2014-07-03 MED ORDER — FLUTICASONE PROPIONATE 50 MCG/ACT NA SUSP
2.0000 | Freq: Every day | NASAL | Status: DC
  Administered 2014-07-04 – 2014-07-05 (×2): 2 via NASAL
  Filled 2014-07-03: qty 16

## 2014-07-03 MED ORDER — DEXTROSE 5 % IV SOLN
2.0000 g | Freq: Once | INTRAVENOUS | Status: AC
  Administered 2014-07-03: 2 g via INTRAVENOUS
  Filled 2014-07-03 (×2): qty 2

## 2014-07-03 MED ORDER — SODIUM CHLORIDE 0.9 % IV SOLN: INTRAVENOUS | Status: AC

## 2014-07-03 MED ORDER — ENOXAPARIN SODIUM 60 MG/0.6ML ~~LOC~~ SOLN
60.0000 mg | Freq: Two times a day (BID) | SUBCUTANEOUS | Status: DC
  Administered 2014-07-03: 60 mg via SUBCUTANEOUS
  Filled 2014-07-03 (×2): qty 0.6

## 2014-07-03 MED ORDER — DEXTROSE 5 % IV SOLN
2.0000 g | Freq: Three times a day (TID) | INTRAVENOUS | Status: DC
  Administered 2014-07-03 – 2014-07-05 (×5): 2 g via INTRAVENOUS
  Filled 2014-07-03 (×7): qty 2

## 2014-07-03 MED ORDER — ACETAMINOPHEN 325 MG PO TABS
650.0000 mg | ORAL_TABLET | Freq: Four times a day (QID) | ORAL | Status: DC | PRN
  Administered 2014-07-03 (×2): 650 mg via ORAL
  Filled 2014-07-03 (×2): qty 2

## 2014-07-03 MED ORDER — VANCOMYCIN HCL IN DEXTROSE 1-5 GM/200ML-% IV SOLN
1000.0000 mg | Freq: Two times a day (BID) | INTRAVENOUS | Status: DC
  Administered 2014-07-04 – 2014-07-05 (×4): 1000 mg via INTRAVENOUS
  Filled 2014-07-03 (×5): qty 200

## 2014-07-03 MED ORDER — SODIUM CHLORIDE 0.9 % IV BOLUS (SEPSIS)
500.0000 mL | INTRAVENOUS | Status: AC
  Administered 2014-07-03: 500 mL via INTRAVENOUS

## 2014-07-03 MED ORDER — LEVOFLOXACIN IN D5W 750 MG/150ML IV SOLN
750.0000 mg | Freq: Once | INTRAVENOUS | Status: AC
  Administered 2014-07-03: 750 mg via INTRAVENOUS
  Filled 2014-07-03: qty 150

## 2014-07-03 MED ORDER — FAMOTIDINE 20 MG PO TABS
20.0000 mg | ORAL_TABLET | Freq: Every day | ORAL | Status: DC
  Administered 2014-07-04 – 2014-07-05 (×2): 20 mg via ORAL
  Filled 2014-07-03 (×3): qty 1

## 2014-07-03 MED ORDER — SODIUM CHLORIDE 0.9 % IV SOLN
INTRAVENOUS | Status: DC
  Administered 2014-07-03 – 2014-07-05 (×5): via INTRAVENOUS

## 2014-07-03 MED ORDER — SODIUM CHLORIDE 0.9 % IV BOLUS (SEPSIS)
1000.0000 mL | Freq: Once | INTRAVENOUS | Status: AC
  Administered 2014-07-03: 1000 mL via INTRAVENOUS

## 2014-07-03 MED ORDER — POTASSIUM CHLORIDE CRYS ER 20 MEQ PO TBCR
40.0000 meq | EXTENDED_RELEASE_TABLET | Freq: Two times a day (BID) | ORAL | Status: AC
Start: 2014-07-03 — End: 2014-07-04
  Administered 2014-07-03 – 2014-07-04 (×2): 40 meq via ORAL
  Filled 2014-07-03 (×3): qty 2

## 2014-07-03 MED ORDER — ACETAMINOPHEN 325 MG PO TABS
650.0000 mg | ORAL_TABLET | Freq: Four times a day (QID) | ORAL | Status: DC | PRN
  Administered 2014-07-03 – 2014-07-05 (×5): 650 mg via ORAL
  Filled 2014-07-03 (×5): qty 2

## 2014-07-03 MED ORDER — LEVOFLOXACIN IN D5W 750 MG/150ML IV SOLN
750.0000 mg | INTRAVENOUS | Status: DC
  Administered 2014-07-04 – 2014-07-05 (×2): 750 mg via INTRAVENOUS
  Filled 2014-07-03 (×2): qty 150

## 2014-07-03 MED ORDER — GLUCOSAMINE HCL-MSM 1500-500 MG/30ML PO LIQD: 30.0000 mL | Freq: Every day | ORAL | Status: DC

## 2014-07-03 MED ORDER — ENOXAPARIN SODIUM 60 MG/0.6ML ~~LOC~~ SOLN
60.0000 mg | Freq: Two times a day (BID) | SUBCUTANEOUS | Status: DC
  Administered 2014-07-04 – 2014-07-05 (×3): 60 mg via SUBCUTANEOUS
  Filled 2014-07-03 (×4): qty 0.6

## 2014-07-03 MED ORDER — FLUTICASONE PROPIONATE HFA 44 MCG/ACT IN AERO: 2.0000 | INHALATION_SPRAY | Freq: Two times a day (BID) | RESPIRATORY_TRACT | Status: DC

## 2014-07-03 NOTE — ED Notes (Signed)
Spoke with MD Vanita Panda regarding pt meeting code sepsis criteria. Ordered to start additional NS bolus. No other recommendations at this time.

## 2014-07-03 NOTE — ED Notes (Signed)
Upon entering room, pt bp 87/42 and hr at 115, re-took bp and resulted at 84/43. Pt initial fluid bolus complete, starting a new NS bolus at this time. Will notify MD Vanita Panda and speak to him about possible code sepsis. Pt reports increasing light-headedness and "achy" legs.

## 2014-07-03 NOTE — Progress Notes (Signed)
PHARMACIST - PHYSICIAN ORDER COMMUNICATION  CONCERNING: P&T Medication Policy on Herbal Medications  DESCRIPTION:  This patient's order for:  GLUCOSAMINE HCL-MSM 1500-500 MG/30ML PO LIQD  has been noted.  This product(s) is classified as an "herbal" or natural product. Due to a lack of definitive safety studies or FDA approval, nonstandard manufacturing practices, plus the potential risk of unknown drug-drug interactions while on inpatient medications, the Pharmacy and Therapeutics Committee does not permit the use of "herbal" or natural products of this type within [facility]   ACTION TAKEN: The pharmacy department is unable to verify this order at this time and your patient has been informed of this safety policy. Please reevaluate patient's clinical condition at discharge and address if the herbal or natural product(s) should be resumed at that time.

## 2014-07-03 NOTE — ED Notes (Signed)
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 C/o sore throat, also pain to right upper side/back when coughing, deep breathing.

## 2014-07-03 NOTE — Progress Notes (Addendum)
ANTIBIOTIC CONSULT NOTE - INITIAL  Pharmacy Consult:  Vancomycin / Azactam / Levaquin Indication:  Sepsis  Allergies  Allergen Reactions  . Albuterol Shortness Of Breath    Tachycardia, feels like she is going to die  . Penicillins Other (See Comments)    Childhood reaction  . Codeine Nausea Only and Other (See Comments)    woosy  . Ibuprofen Other (See Comments)    Induces patient asthma  . Meperidine Nausea Only    Other reaction(s): Other (See Comments) "wiped out" and nausea  . Other     Unknown narcotic  . Phenobarbital Other (See Comments)    Erratic behavior, hyper, jittery  . Adhesive [Tape] Rash    Patient Measurements: Height: '5\' 3"'$  (160 cm) Weight: 170 lb (77.111 kg) IBW/kg (Calculated) : 52.4  Vital Signs: Temp: 99.5 F (37.5 C) (04/19 0957) Temp Source: Oral (04/19 0957) BP: 94/50 mmHg (04/19 1100) Pulse Rate: 119 (04/19 1100) Intake/Output from this shift: Total I/O In: 2000 [I.V.:2000] Out: -   Labs:  Recent Labs  07/03/14 0812  WBC 6.0  HGB 10.9*  PLT 295  CREATININE 0.56   Estimated Creatinine Clearance: 61.6 mL/min (by C-G formula based on Cr of 0.56). No results for input(s): VANCOTROUGH, VANCOPEAK, VANCORANDOM, GENTTROUGH, GENTPEAK, GENTRANDOM, TOBRATROUGH, TOBRAPEAK, TOBRARND, AMIKACINPEAK, AMIKACINTROU, AMIKACIN in the last 72 hours.   Microbiology: No results found for this or any previous visit (from the past 720 hour(s)).  Medical History: Past Medical History  Diagnosis Date  . Asthma   . Breast pain in female     throbbing  . Abdominal pain   . Breast lump in female   . Cancer     rt  . PE (pulmonary embolism) 03/26/2014  . Neuropathy   . GERD (gastroesophageal reflux disease)   . History of hiatal hernia       Assessment: 4 YOF with breast cancer and mets to lungs sent to the ED by her Oncologist due to fever and sore throat.  Pharmacy consulted to initiate broad spectrum antibiotics for sepsis, in setting of  immunosuppression from chemotherapy.  CT showed enlargement of cavitary mass in RUL with air-fluid level that may represent abscess or infected tumor.  Baseline labs reviewed and noted first doses of antibiotics are already ordered.   Goal of Therapy:  Vancomycin trough level 15-20 mcg/ml   Plan:  - Vanc 1gm IV Q12H - Azactam 1gm IV Q8H - LVQ '750mg'$  IV Q24H, start tomorrow - Monitor renal fxn, clinical progress, vanc trough as indicated - F/U continuing Lovenox for hx recent PE, K+ supplementation, chemotherapy plans    Demaya Hardge D. Mina Marble, PharmD, BCPS Pager:  [phone] 07/03/2014, 11:30 AM

## 2014-07-03 NOTE — H&P (Addendum)
[facility] History and Physical  [name] [name] UMP:[id number] DOB: [birth year] DOA: 07/03/2014  Referring physician: Emergency Department PCP: Cari Caraway, MD  Specialists:   Chief Complaint: Fevers  HPI: [name] [name] is a 74 y.o. female  With a hx of breast cancer currently undergoing chemo at [facility], PE, GERD who presents to the ED with complaints of sore throat and fevers. In the ED, pt was found to have temp of 102.6 with hypotension with sbp into the 80's and tachycardia. No leukocytosis and no lactic acidosis. EDP discussed case with pt's Oncologist. Pt was given vanc, aztreonam, and levaquin in ED and hospitalist consulted for admission.  Review of Systems:  Review of Systems  Constitutional: Positive for fever. Negative for malaise/fatigue.  HENT: Positive for sore throat. Negative for congestion and ear pain.   Eyes: Negative for double vision and discharge.  Respiratory: Negative for cough and shortness of breath.   Cardiovascular: Negative for chest pain and orthopnea.  Gastrointestinal: Negative for nausea and abdominal pain.  Genitourinary: Negative for dysuria and frequency.  Musculoskeletal: Negative for back pain and falls.  Neurological: Negative for tingling, focal weakness, seizures and loss of consciousness.  Psychiatric/Behavioral: Negative for depression and hallucinations.    Past Medical History  Diagnosis Date  . Asthma   . Breast pain in female     throbbing  . Abdominal pain   . Breast lump in female   . Cancer     rt  . PE (pulmonary embolism) 03/26/2014  . Neuropathy   . GERD (gastroesophageal reflux disease)   . History of hiatal hernia    Past Surgical History  Procedure Laterality Date  . Colonoscopy w/ polypectomy  12/11, 4/12  . Tonsillectomy    . Skin cancer excision  2011  . Bone density  2011  . Mastectomy      right july 2012  . Breast surgery      Right Mastectomy  . Lung biopsy  08/2013    Social History:  reports that she has never smoked. She has never used smokeless tobacco. She reports that she does not drink alcohol or use illicit drugs.  where does patient live--home, ALF, SNF? and with whom if at home?  Can patient participate in ADLs?  Allergies  Allergen Reactions  . Albuterol Shortness Of Breath    Tachycardia, feels like she is going to die  . Penicillins Other (See Comments)    Childhood reaction  . Codeine Nausea Only and Other (See Comments)    woosy  . Ibuprofen Other (See Comments)    Induces patient asthma  . Meperidine Nausea Only    Other reaction(s): Other (See Comments) "wiped out" and nausea  . Other     Unknown narcotic  . Phenobarbital Other (See Comments)    Erratic behavior, hyper, jittery  . Adhesive [Tape] Rash    No family history on file. Sister without cancer history  Prior to Admission medications   Medication Sig Start Date End Date Taking? Authorizing Provider  beclomethasone (QVAR) 40 MCG/ACT inhaler Inhale 2 puffs into the lungs 2 (two) times daily as needed.    Yes Historical Provider, MD  Calcium Carbonate-Vitamin D (CALCIUM + D PO) Take 2 tablets by mouth daily.   Yes Historical Provider, MD  clotrimazole (LOTRIMIN) 1 % cream Apply topically 2 (two) times daily. 05/21/14  Yes Belkys A Regalado, MD  enoxaparin (LOVENOX) 60 MG/0.6ML injection Inject 60 mg into the skin every 12 (twelve) hours.  04/24/14  Yes Historical Provider, MD  fluticasone (FLONASE) 50 MCG/ACT nasal spray Place 2 sprays into both nostrils daily.   Yes Historical Provider, MD  Glucosamine HCl-MSM 1500-500 MG/30ML LIQD Take 30 mLs by mouth daily.   Yes Historical Provider, MD  hydroxypropyl methylcellulose (ISOPTO TEARS) 2.5 % ophthalmic solution Place 1 drop into both eyes as needed for dry eyes.   Yes Historical Provider, MD  ipratropium (ATROVENT HFA) 17 MCG/ACT inhaler Inhale 2 puffs into the lungs 4 (four) times daily as needed. 02/28/14 02/28/15 Yes  Historical Provider, MD  lidocaine-prilocaine (EMLA) cream Apply 1 application topically once.    Yes Historical Provider, MD  loratadine (CLARITIN REDITABS) 10 MG dissolvable tablet Take 10 mg by mouth daily.   Yes Historical Provider, MD  Multiple Vitamin (MULTIVITAMIN WITH MINERALS) TABS tablet Take 1 tablet by mouth daily.   Yes Historical Provider, MD  ondansetron (ZOFRAN) 4 MG tablet Take 4 mg by mouth every 8 (eight) hours as needed for nausea.  04/05/14  Yes Historical Provider, MD  predniSONE (DELTASONE) 10 MG tablet Take 30 mg by mouth daily with breakfast.  04/28/14  Yes Historical Provider, MD  [place] Patient goes to [facility] to get chemo. Last treatment was 06-20-14. she's getting epirubicin.  Patient goes 2 weeks on and 1 weeks off   Yes Historical Provider, MD  ranitidine (ZANTAC) 150 MG tablet Take 150 mg by mouth 2 (two) times daily.   Yes Historical Provider, MD  urea (CARMOL) 10 % cream Apply topically as needed. Apply on palm 05/21/14  Yes Belkys A Regalado, MD  potassium chloride SA (K-DUR,KLOR-CON) 20 MEQ tablet Take 1 tablet (20 mEq total) by mouth 3 (three) times daily. 05/21/14   Elmarie Shiley, MD   Physical Exam: Filed Vitals:   07/03/14 1100 07/03/14 1200 07/03/14 1230 07/03/14 1300  BP: 94/50 98/47 109/45 94/40  Pulse: 119 99 101 97  Temp:      TempSrc:      Resp:   20   Height:      Weight:      SpO2: 92% 95% 97% 98%     General:  Awake, in nad  Eyes: PERRL B  ENT: membranes moist, dentition fair  Neck: trachea midline, neck supple  Cardiovascular: regular, s1, s2  Respiratory: normal resp effort, no wheezing  Abdomen: soft,nondistended  Skin: normal skin turgor, no abnormal skin lesions seen  Musculoskeletal: perfused, no clubbing  Psychiatric: mood/affect normal//no auditory/visual hallucinations  Neurologic: cn2-12 grossly intact, strength/sensation intact  Labs on Admission:  Basic Metabolic Panel:  Recent  Labs Lab 07/03/14 0812  NA 136  K 3.2*  CL 97  CO2 29  GLUCOSE 107*  BUN 9  CREATININE 0.56  CALCIUM 8.7   Liver Function Tests:  Recent Labs Lab 07/03/14 0812  AST 17  ALT 13  ALKPHOS 55  BILITOT 0.6  PROT 5.6*  ALBUMIN 2.9*   No results for input(s): LIPASE, AMYLASE in the last 168 hours. No results for input(s): AMMONIA in the last 168 hours. CBC:  Recent Labs Lab 07/03/14 0812  WBC 6.0  NEUTROABS 3.2  HGB 10.9*  HCT 33.9*  MCV 92.4  PLT 295   Cardiac Enzymes: No results for input(s): CKTOTAL, CKMB, CKMBINDEX, TROPONINI in the last 168 hours.  BNP (last 3 results)  Recent Labs  05/20/14 0850  BNP 92.7    ProBNP (last 3 results) No results for input(s): PROBNP in the last 8760 hours.  CBG:  No results for input(s): GLUCAP in the last 168 hours.  Radiological Exams on Admission: Dg Chest 2 View  07/03/2014   CLINICAL DATA:  Sore throat and fever.  Metastatic breast cancer.  EXAM: CHEST  2 VIEW  COMPARISON:  05/20/2014  FINDINGS: Progression of cavitary mass right upper lobe. Given the rapid growth, this could be due to infection however tumor is also possible. 1 cm right upper lobe nodule is unchanged in size. Left basilar nodule is better seen on prior CT.  Mild left lower lobe atelectasis. Small left effusion noted. Negative for heart failure.  Port-A-Cath tip in the right atrium is unchanged.  IMPRESSION: Interval enlargement of right upper lobe cavitary mass. This may represent infection given the rapid growth.  1 cm right upper lobe nodule unchanged and may represent tumor or infection. Based on prior chest CT, multiple nodules are present suggestive of metastatic disease.   Electronically Signed   By: Franchot Gallo M.D.   On: 07/03/2014 08:48   Ct Chest W Contrast  07/03/2014   CLINICAL DATA:  Metastatic breast cancer. Enlarging right upper lobe nodule. Currently on chemotherapy.  EXAM: CT CHEST WITH CONTRAST  TECHNIQUE: Multidetector CT imaging of  the chest was performed during intravenous contrast administration.  CONTRAST:  83m OMNIPAQUE IOHEXOL 300 MG/ML  SOLN  COMPARISON:  CT chest 05/20/2014, 03/25/2014  FINDINGS: Cavitary mass right upper lobe shows interval enlargement. This contains an air-fluid level and thick enhancing walls with irregular margins. This mass now measures 4.4 x 4.0 cm. This may represent a lung abscess or infected tumor. Other lung nodules are stable.  Right upper lobe nodule 12 x 16 mm stable. Right lower lobe nodule 19 x 12 mm stable. Left upper lobe nodule anteriorly 13 mm stable. 1 cm left lower lobe nodule stable. 17 mm left medial lung base nodule stable.  Patchy airspace disease density in the lingula unchanged and may represent atelectasis or scarring.  Minimal pleural effusion bilaterally. Mild left lower lobe atelectasis.  Negative for mediastinal adenopathy.  No hilar adenopathy.  Port-A-Cath tip extends into the right atrium.  No mass in the upper abdomen.  Diffuse widespread sclerotic changes in the vertebral bodies compatible with bony metastatic disease, stable.  IMPRESSION: Significant enlargement of cavitary mass in the right upper lobe with air-fluid level. This may represent abscess or infected tumor. Other lung nodules consistent with metastatic disease are stable.  Small pleural effusions. Left lower lobe and lingular atelectasis similar to the prior study.   Electronically Signed   By: CFranchot GalloM.D.   On: 07/03/2014 11:06    Assessment/Plan Principal Problem:   Sepsis Active Problems:   Breast cancer   History of pulmonary embolism   Fever  1. Sepsis with unclear etiology, possible HCAP 1. UA leukocytes, no nitrites, few bacteria 2. CXR with interval enlargement of RUL cavitary mass - rapid growth suggesting infection 3. Blood cx pending, urine cx pending 4. For now, will cont broad abx to cover HCAP pending above cultures 5. Cont IVF and supportive care 2. Sore throat 1. Will check  rapid strep 3. Hypokalemia 1. Will replace 2. Follow lytes 4. [facility] St[facility] 3. EDP discussed with primary Oncologist. Plan to temporarily hold chemo given acute illness 5. Hx PE 1. On therapeutic Lovenox prior to admit 2. Will continue 6. Fevers 1. Secondary to above 2. Stable 7. GERD 1. Stable 2. On pepcid prior to admit. Will cont H2  Code Status: Full  Family Communication: Pt in room Disposition Plan: Admit stepdown   [place] [place] [facility] Pager [phone]  If 7PM-7AM, please contact night-coverage [email or web address] Password [email or web address] 07/03/2014, 2:40 PM

## 2014-07-03 NOTE — ED Notes (Signed)
Pt up to restroom without difficulty.

## 2014-07-03 NOTE — ED Notes (Signed)
Pt was sent here by her oncologist (pager # [phone])  for fever and sore throat. Pts next chemo for breast cancer is scheduled for the end of this week.

## 2014-07-03 NOTE — ED Provider Notes (Signed)
CSN: [id number]     Arrival date & time 07/03/14  0719 History   First MD Initiated Contact with Patient 07/03/14 [phone]     Chief Complaint  Patient presents with  . Sore Throat  . Fever     HPI  Patient presents with concern of sore throat, fever, fatigue, right-sided flank pain. Patient has had the flank pain in the sore throat for at least one week, but the fever is new over the past 2 days. The right flank pain is nonradiating, sore, moderate. Patient is a treatment at [facility], currently receiving chemotherapy for metastatic cancer with metastases to her lungs. Last chemotherapy was 2 weeks ago.  Patient is scheduled for chemotherapy tomorrow. No other chest pain, abdominal pain, vomiting. Pain is worse with ambulation, coughing.  Past Medical History  Diagnosis Date  . Asthma   . Breast pain in female     throbbing  . Abdominal pain   . Breast lump in female   . Cancer     rt  . PE (pulmonary embolism) 03/26/2014  . Neuropathy   . GERD (gastroesophageal reflux disease)   . History of hiatal hernia    Past Surgical History  Procedure Laterality Date  . Colonoscopy w/ polypectomy  12/11, 4/12  . Tonsillectomy    . Skin cancer excision  2011  . Bone density  2011  . Mastectomy      right july 2012  . Breast surgery      Right Mastectomy  . Lung biopsy  08/2013   No family history on file. History  Substance Use Topics  . Smoking status: Never Smoker   . Smokeless tobacco: Never Used  . Alcohol Use: No   OB History    No data available     Review of Systems  Constitutional:       Per HPI, otherwise negative  HENT:       Per HPI, otherwise negative  Respiratory:       Per HPI, otherwise negative  Cardiovascular:       Per HPI, otherwise negative  Gastrointestinal: Negative for vomiting.  Endocrine:       Negative aside from HPI  Genitourinary:       Neg aside from HPI   Musculoskeletal:       Per HPI, otherwise negative  Skin: Negative.    Neurological: Negative for syncope.      Allergies  Albuterol; Penicillins; Codeine; Ibuprofen; Meperidine; Other; Phenobarbital; and Adhesive  Home Medications   Prior to Admission medications   Medication Sig Start Date End Date Taking? Authorizing Provider  beclomethasone (QVAR) 40 MCG/ACT inhaler Inhale 2 puffs into the lungs 2 (two) times daily as needed.     Historical Provider, MD  Calcium Carbonate-Vitamin D (CALCIUM + D PO) Take 2 tablets by mouth daily.    Historical Provider, MD  clotrimazole (LOTRIMIN) 1 % cream Apply topically 2 (two) times daily. 05/21/14   Belkys A Regalado, MD  enoxaparin (LOVENOX) 60 MG/0.6ML injection Inject 60 mg into the skin every 12 (twelve) hours.  04/24/14   Historical Provider, MD  Glucosamine HCl-MSM 1500-500 MG/30ML LIQD Take 30 mLs by mouth daily.    Historical Provider, MD  hydroxypropyl methylcellulose (ISOPTO TEARS) 2.5 % ophthalmic solution Place 1 drop into both eyes as needed for dry eyes.    Historical Provider, MD  ipratropium (ATROVENT HFA) 17 MCG/ACT inhaler Inhale 2 puffs into the lungs 4 (four) times daily as needed. 02/28/14  02/28/15  Historical Provider, MD  lidocaine-prilocaine (EMLA) cream Apply 1 application topically once.     Historical Provider, MD  loratadine (CLARITIN REDITABS) 10 MG dissolvable tablet Take 10 mg by mouth daily.    Historical Provider, MD  Multiple Vitamin (MULTIVITAMIN WITH MINERALS) TABS tablet Take 1 tablet by mouth daily.    Historical Provider, MD  ondansetron (ZOFRAN) 4 MG tablet Take 4 mg by mouth every 8 (eight) hours as needed for nausea.  04/05/14   Historical Provider, MD  potassium chloride SA (K-DUR,KLOR-CON) 20 MEQ tablet Take 1 tablet (20 mEq total) by mouth 3 (three) times daily. 05/21/14   Belkys A Regalado, MD  predniSONE (DELTASONE) 10 MG tablet Take 10 mg by mouth 2 (two) times daily.  04/28/14   Historical Provider, MD  ranitidine (ZANTAC) 150 MG tablet Take 150 mg by mouth 2 (two) times  daily.    Historical Provider, MD  urea (CARMOL) 10 % cream Apply topically as needed. Apply on palm 05/21/14   Belkys A Regalado, MD   BP 115/51 mmHg  Pulse 122  Temp(Src) 102.6 F (39.2 C) (Oral)  Resp 20  Ht '5\' 3"'$  (1.6 m)  Wt 170 lb (77.111 kg)  BMI 30.12 kg/m2  SpO2 95% Physical Exam  Constitutional: She is oriented to person, place, and time. She appears well-developed and well-nourished. No distress.  HENT:  Head: Normocephalic and atraumatic.  Mouth/Throat:    Eyes: Conjunctivae and EOM are normal.  Cardiovascular: Normal rate and regular rhythm.   Pulmonary/Chest: Effort normal and breath sounds normal. No stridor. No respiratory distress.  Left upper chest where port is in place is nontender, unremarkable  Abdominal: She exhibits no distension. There is no tenderness. There is no rebound.  Musculoskeletal: She exhibits no edema.  Neurological: She is alert and oriented to person, place, and time. No cranial nerve deficit.  Skin: Skin is warm and dry.  Psychiatric: She has a normal mood and affect.  Nursing note and vitals reviewed.   ED Course  Procedures (including critical care time) Labs Review Labs Reviewed  CBC WITH DIFFERENTIAL/PLATELET - Abnormal; Notable for the following:    RBC 3.67 (*)    Hemoglobin 10.9 (*)    HCT 33.9 (*)    RDW 17.5 (*)    Monocytes Relative 20 (*)    Monocytes Absolute 1.2 (*)    All other components within normal limits  COMPREHENSIVE METABOLIC PANEL - Abnormal; Notable for the following:    Potassium 3.2 (*)    Glucose, Bld 107 (*)    Total Protein 5.6 (*)    Albumin 2.9 (*)    All other components within normal limits  URINALYSIS, ROUTINE W REFLEX MICROSCOPIC - Abnormal; Notable for the following:    APPearance CLOUDY (*)    Leukocytes, UA MODERATE (*)    All other components within normal limits  URINE MICROSCOPIC-ADD ON - Abnormal; Notable for the following:    Squamous Epithelial / LPF FEW (*)    Bacteria, UA FEW (*)     All other components within normal limits  CULTURE, BLOOD (ROUTINE X 2)  CULTURE, BLOOD (ROUTINE X 2)  I-STAT CG4 LACTIC ACID, ED    Imaging Review Dg Chest 2 View  07/03/2014   CLINICAL DATA:  Sore throat and fever.  Metastatic breast cancer.  EXAM: CHEST  2 VIEW  COMPARISON:  05/20/2014  FINDINGS: Progression of cavitary mass right upper lobe. Given the rapid growth, this could be due to infection  however tumor is also possible. 1 cm right upper lobe nodule is unchanged in size. Left basilar nodule is better seen on prior CT.  Mild left lower lobe atelectasis. Small left effusion noted. Negative for heart failure.  Port-A-Cath tip in the right atrium is unchanged.  IMPRESSION: Interval enlargement of right upper lobe cavitary mass. This may represent infection given the rapid growth.  1 cm right upper lobe nodule unchanged and may represent tumor or infection. Based on prior chest CT, multiple nodules are present suggestive of metastatic disease.   Electronically Signed   By: Franchot Gallo M.D.   On: 07/03/2014 08:48   Ct Chest W Contrast  07/03/2014   CLINICAL DATA:  Metastatic breast cancer. Enlarging right upper lobe nodule. Currently on chemotherapy.  EXAM: CT CHEST WITH CONTRAST  TECHNIQUE: Multidetector CT imaging of the chest was performed during intravenous contrast administration.  CONTRAST:  63m OMNIPAQUE IOHEXOL 300 MG/ML  SOLN  COMPARISON:  CT chest 05/20/2014, 03/25/2014  FINDINGS: Cavitary mass right upper lobe shows interval enlargement. This contains an air-fluid level and thick enhancing walls with irregular margins. This mass now measures 4.4 x 4.0 cm. This may represent a lung abscess or infected tumor. Other lung nodules are stable.  Right upper lobe nodule 12 x 16 mm stable. Right lower lobe nodule 19 x 12 mm stable. Left upper lobe nodule anteriorly 13 mm stable. 1 cm left lower lobe nodule stable. 17 mm left medial lung base nodule stable.  Patchy airspace disease density in  the lingula unchanged and may represent atelectasis or scarring.  Minimal pleural effusion bilaterally. Mild left lower lobe atelectasis.  Negative for mediastinal adenopathy.  No hilar adenopathy.  Port-A-Cath tip extends into the right atrium.  No mass in the upper abdomen.  Diffuse widespread sclerotic changes in the vertebral bodies compatible with bony metastatic disease, stable.  IMPRESSION: Significant enlargement of cavitary mass in the right upper lobe with air-fluid level. This may represent abscess or infected tumor. Other lung nodules consistent with metastatic disease are stable.  Small pleural effusions. Left lower lobe and lingular atelectasis similar to the prior study.   Electronically Signed   By: CFranchot GalloM.D.   On: 07/03/2014 11:06    Pulse oximetry 100% room air normal Cardiac 121 sinus tach abnormal  9:41 AM Patient remains hypotensive, tachycardic. Fever has decreased. X-ray suggests increased right upper lobe cavitary lesion. Patient has CT imaging from one month ago without similar findings. Patient will have CT scan performed.  Update: Patient continues to receive IV fluids blood pressure has hit 83/41.   Update: Patient back from CT scan.  I reviewed the findings, discussed with the family. Blood pressure now slightly improved. Fever, hypotension, patient had empiric initiation of fluids, per sepsis protocol. Broad-spectrum antibiotics provided as well with concern for cavitary lesion.   Update: Blood pressure now 94/50. [facility] We agreed to given the patient's clinical status, admission here is reasonable.  Update: Blood pressure 109/45. Heart rate 90s/100.   MDM   Final diagnoses:  Sepsis, due to unspecified organism  Sore throat    Patient presents with fever, sore throat. Notably, the patient is currently receiving chemotherapy for breast cancer, with known metastases. Here the  patient is tachycardic, febrile on arrival, becomes hypotensive. Patient received fluids, per protocol, spectrum antibiotics per protocol. Imaging demonstrates cavitary lesion concerning for infection versus inflammatory malignancy. After fluid resuscitation patient did have improvement in  her blood pressure, decrease in heart rate. Patient reported admission to the step down unit for further evaluation and management.  CRITICAL CARE Performed by: Carmin Muskrat Total critical care time: 40 Critical care time was exclusive of separately billable procedures and treating other patients. Critical care was necessary to treat or prevent imminent or life-threatening deterioration. Critical care was time spent personally by me on the following activities: development of treatment plan with patient and/or surrogate as well as nursing, discussions with consultants, evaluation of patient's response to treatment, examination of patient, obtaining history from patient or surrogate, ordering and performing treatments and interventions, ordering and review of laboratory studies, ordering and review of radiographic studies, pulse oximetry and re-evaluation of patient's condition.     Carmin Muskrat, MD 07/03/14 1321

## 2014-07-04 DIAGNOSIS — C50919 Malignant neoplasm of unspecified site of unspecified female breast: Secondary | ICD-10-CM

## 2014-07-04 DIAGNOSIS — R502 Drug induced fever: Secondary | ICD-10-CM

## 2014-07-04 LAB — COMPREHENSIVE METABOLIC PANEL
ALT: 10 U/L (ref 0–35)
ANION GAP: 8 (ref 5–15)
AST: 12 U/L (ref 0–37)
Albumin: 2.1 g/dL — ABNORMAL LOW (ref 3.5–5.2)
Alkaline Phosphatase: 44 U/L (ref 39–117)
BILIRUBIN TOTAL: 0.5 mg/dL (ref 0.3–1.2)
BUN: 8 mg/dL (ref 6–23)
CO2: 28 mmol/L (ref 19–32)
Calcium: 8 mg/dL — ABNORMAL LOW (ref 8.4–10.5)
Chloride: 105 mmol/L (ref 96–112)
Creatinine, Ser: 0.51 mg/dL (ref 0.50–1.10)
GFR calc Af Amer: >90 mL/min (ref >90)
GFR calc non Af Amer: >90 mL/min (ref >90)
Glucose, Bld: 114 mg/dL — ABNORMAL HIGH (ref 70–99)
POTASSIUM: 3.3 mmol/L — AB (ref 3.5–5.1)
SODIUM: 141 mmol/L (ref 135–145)
Total Protein: 4.3 g/dL — ABNORMAL LOW (ref 6.0–8.3)

## 2014-07-04 LAB — LEGIONELLA ANTIGEN, URINE

## 2014-07-04 LAB — CBC
HEMATOCRIT: 31 % — AB (ref 36.0–46.0)
HEMOGLOBIN: 9.8 g/dL — AB (ref 12.0–15.0)
MCH: 29.3 pg (ref 26.0–34.0)
MCHC: 31.6 g/dL (ref 30.0–36.0)
MCV: 92.8 fL (ref 78.0–100.0)
Platelets: 268 10*3/uL (ref 150–400)
RBC: 3.34 MIL/uL — AB (ref 3.87–5.11)
RDW: 17.8 % — ABNORMAL HIGH (ref 11.5–15.5)
WBC: 5.3 10*3/uL (ref 4.0–10.5)

## 2014-07-04 LAB — HIV ANTIBODY (ROUTINE TESTING W REFLEX): HIV Screen 4th Generation wRfx: NONREACTIVE

## 2014-07-04 LAB — LACTIC ACID, PLASMA: Lactic Acid, Venous: 0.6 mmol/L (ref 0.5–2.0)

## 2014-07-04 MED ORDER — SODIUM CHLORIDE 0.9 % IV BOLUS (SEPSIS)
1000.0000 mL | Freq: Once | INTRAVENOUS | Status: AC
  Administered 2014-07-04: 1000 mL via INTRAVENOUS

## 2014-07-04 MED ORDER — HYDROCORTISONE NA SUCCINATE PF 100 MG IJ SOLR
50.0000 mg | Freq: Four times a day (QID) | INTRAMUSCULAR | Status: AC
  Administered 2014-07-04 (×2): 50 mg via INTRAVENOUS
  Filled 2014-07-04 (×2): qty 1

## 2014-07-04 MED ORDER — POTASSIUM CHLORIDE CRYS ER 20 MEQ PO TBCR
40.0000 meq | EXTENDED_RELEASE_TABLET | Freq: Once | ORAL | Status: AC
  Administered 2014-07-04: 40 meq via ORAL
  Filled 2014-07-04: qty 2

## 2014-07-04 MED ORDER — SODIUM CHLORIDE 0.9 % IV BOLUS (SEPSIS)
500.0000 mL | Freq: Once | INTRAVENOUS | Status: AC
  Administered 2014-07-04: 500 mL via INTRAVENOUS

## 2014-07-04 NOTE — Progress Notes (Addendum)
Pt BP 88/39. MAP in the 50's. Hal Hope, MD notified, and 1 L bolus ordered.  After bolus, BP was reassessed. BP 95/53. An additional 500cc bolus was ordered. Will continue to monitor.   Hart Rochester, RN, BSN

## 2014-07-04 NOTE — Progress Notes (Signed)
[facility]                                                                              Patient Demographics  [name] [name], is a 74 y.o. female, DOB - May 30, 1940, OFB:[id number]  Admit date - 07/03/2014   Admitting Physician Donne Hazel, MD  Outpatient Primary MD for the patient is [facility], MD  LOS - 1   Chief Complaint  Patient presents with  . Sore Throat  . Fever       Brief HPI   Patient is a 74 year old female with breast cancer currently undergoing chemotherapy at [facility], lasted 3 weeks ago, history of pulmonary embolism, GERD presented to ED with complaints of sore throat and fevers. Patient was found to have temperature of 102.6 F with hypotension with SBP in 80s and tachycardia. No leukocytosis and no lactic acidosis. Patient was started on vancomycin, aztreonam and Levaquin in ED. UA also appears to be positive for UTI.   Assessment & Plan    Principal Problem:   Sepsis with healthcare associated pneumonia , UTI, likely postobstructive pneumonia due to right upper lobe tumor -Urine strep antigen negative, follow blood cultures, negative so far, HIV negative , follow urine culture and sensitivities -Continue IV Vancomycin, aztreonam, Levaquin  - CT chest showed significant enlargement of the cavitary mass in the right upper lobe with air fluid levels, may represent abscess or infected tumor. This was discussed in detail with the patient and her husband at the bedside. Patient reports that she knows about the right upper lobe tumor. -  Patient's records reviewed. Patient had CT chest on 06/13/14 at [facility] which had shown 2 x 2.1 cm right upper lobe cavitary tumor, 1.1 x1.4cm right middle lobe pulmonary nodule, left anterior pleural-based mass 1.2x 1.3cm, left lower lobe pleural-based mass 1.3 x1.8cm, and 1.5 x1.8cm.    Active Problems:  Hypotension - Likely due to sepsis and relative adrenal insufficiency -  Placed on IV hydrocortisone, will taper slowly once blood pressure is more stable    Breast cancer With pulmonary metastasis, pleural metastatic disease - Patient is receiving chemotherapy in [facility], was due today, patient has already called her oncologist at [facility] and chemotherapy is postponed for now until she is medically stable.    History of pulmonary embolism - Continue therapeutic Lovenox   GERD - cont pepcid  Code Status: full  Family Communication: Discussed in detail with the patient, all imaging results, lab results explained to the patient  And husband    Disposition Plan: remain in SDU  Time Spent in minutes 25 mins  Procedures  CT chest  Consults   none  DVT Prophylaxis Lovenox   Medications  Scheduled Meds: . sodium chloride   Intravenous STAT  . aztreonam  2 g Intravenous 3 times per day  . enoxaparin  60 mg Subcutaneous Q12H  . famotidine  20 mg Oral Daily  . fluticasone  2 spray Each Nare Daily  . levofloxacin (LEVAQUIN) IV  750 mg Intravenous Q24H  . predniSONE  30 mg Oral Q breakfast  . sodium chloride  3 mL Intravenous Q12H  .  vancomycin  1,000 mg Intravenous Q12H   Continuous Infusions: . sodium chloride 100 mL/hr at 07/04/14 1000   PRN Meds:.acetaminophen **OR** acetaminophen, ipratropium, ondansetron **OR** ondansetron (ZOFRAN) IV   Antibiotics   Anti-infectives    Start     Dose/Rate Route Frequency Ordered Stop   07/04/14 1200  levofloxacin (LEVAQUIN) IVPB 750 mg     750 mg 100 mL/hr over 90 Minutes Intravenous Every 24 hours 07/03/14 1134     07/04/14 0100  vancomycin (VANCOCIN) IVPB 1000 mg/200 mL premix     1,000 mg 200 mL/hr over 60 Minutes Intravenous Every 12 hours 07/03/14 1134     07/03/14 2200  aztreonam (AZACTAM) 2 g in dextrose 5 % 50 mL IVPB     2 g 100 mL/hr over 30 Minutes Intravenous 3 times per day 07/03/14 1527 07/11/14 2159   07/03/14 2100  aztreonam (AZACTAM) 1 g in dextrose 5 % 50 mL IVPB  Status:   Discontinued     1 g 100 mL/hr over 30 Minutes Intravenous 3 times per day 07/03/14 1134 07/03/14 1531   07/03/14 1130  levofloxacin (LEVAQUIN) IVPB 750 mg     750 mg 100 mL/hr over 90 Minutes Intravenous  Once 07/03/14 1119 07/03/14 1417   07/03/14 1130  aztreonam (AZACTAM) 2 g in dextrose 5 % 50 mL IVPB     2 g 100 mL/hr over 30 Minutes Intravenous  Once 07/03/14 1119 07/03/14 1334   07/03/14 1130  vancomycin (VANCOCIN) IVPB 1000 mg/200 mL premix     1,000 mg 200 mL/hr over 60 Minutes Intravenous  Once 07/03/14 1119 07/03/14 1508        Subjective:   Gardenia Milks was seen and examined today.  Patient denies dizziness, abdominal pain, N/V/D/C, new weakness, numbess, tingling. Overnight had hypotension issues, BP now more stable. No fevers.      Objective:   Blood pressure 112/52, pulse 98, temperature 97.9 F (36.6 C), temperature source Oral, resp. rate 25, height '5\' 3"'$  (1.6 m), weight 79.1 kg (174 lb 6.1 oz), SpO2 98 %.  Wt Readings from Last 3 Encounters:  07/03/14 79.1 kg (174 lb 6.1 oz)  05/20/14 76.204 kg (168 lb)  03/27/14 76.25 kg (168 lb 1.6 oz)     Intake/Output Summary (Last 24 hours) at 07/04/14 1149 Last data filed at 07/04/14 1001  Gross per 24 hour  Intake   1950 ml  Output   1700 ml  Net    250 ml    Exam  General: Alert and oriented x 3, NAD  HEENT:  PERRLA, EOMI, Anicteic Sclera, mucous membranes moist.   Neck: Supple, no JVD, no masses  CVS: S1 S2 auscultated, no rubs, murmurs or gallops. Regular rate and rhythm.  Respiratory: dec BS at bases   Abdomen: Soft, nontender, nondistended, + bowel sounds  Ext: no cyanosis clubbing or edema  Neuro: AAOx3, Cr N's II- XII. Strength 5/5 upper and lower extremities bilaterally  Skin: No rashes  Psych: Normal affect and demeanor, alert and oriented x3    Data Review   Micro Results Recent Results (from the past 240 hour(s))  Blood Culture (routine x 2)     Status: None (Preliminary  result)   Collection Time: 07/03/14 11:56 AM  Result Value Ref Range Status   Specimen Description BLOOD LEFT ASSIST CONTROL  Final   Special Requests BOTTLES DRAWN AEROBIC AND ANAEROBIC 5ML  Final   Culture   Final  BLOOD CULTURE RECEIVED NO GROWTH TO DATE CULTURE WILL BE HELD FOR 5 DAYS BEFORE ISSUING A FINAL NEGATIVE REPORT Performed at [facility]    Report Status PENDING  Incomplete  Blood Culture (routine x 2)     Status: None (Preliminary result)   Collection Time: 07/03/14 12:20 PM  Result Value Ref Range Status   Specimen Description BLOOD LEFT ASSIST CONTROL  Final   Special Requests BOTTLES DRAWN AEROBIC AND ANAEROBIC 10MLS  Final   Culture   Final           BLOOD CULTURE RECEIVED NO GROWTH TO DATE CULTURE WILL BE HELD FOR 5 DAYS BEFORE ISSUING A FINAL NEGATIVE REPORT Performed at [facility]    Report Status PENDING  Incomplete  MRSA PCR Screening     Status: None   Collection Time: 07/03/14  3:32 PM  Result Value Ref Range Status   MRSA by PCR NEGATIVE NEGATIVE Final    Comment:        The GeneXpert MRSA Assay (FDA approved for NASAL specimens only), is one component of a comprehensive MRSA colonization surveillance program. It is not intended to diagnose MRSA infection nor to guide or monitor treatment for MRSA infections.     Radiology Reports Dg Chest 2 View  07/03/2014   CLINICAL DATA:  Sore throat and fever.  Metastatic breast cancer.  EXAM: CHEST  2 VIEW  COMPARISON:  05/20/2014  FINDINGS: Progression of cavitary mass right upper lobe. Given the rapid growth, this could be due to infection however tumor is also possible. 1 cm right upper lobe nodule is unchanged in size. Left basilar nodule is better seen on prior CT.  Mild left lower lobe atelectasis. Small left effusion noted. Negative for heart failure.  Port-A-Cath tip in the right atrium is unchanged.  IMPRESSION: Interval enlargement of right upper lobe cavitary mass. This may  represent infection given the rapid growth.  1 cm right upper lobe nodule unchanged and may represent tumor or infection. Based on prior chest CT, multiple nodules are present suggestive of metastatic disease.   Electronically Signed   By: Franchot Gallo M.D.   On: 07/03/2014 08:48   Ct Chest W Contrast  07/03/2014   CLINICAL DATA:  Metastatic breast cancer. Enlarging right upper lobe nodule. Currently on chemotherapy.  EXAM: CT CHEST WITH CONTRAST  TECHNIQUE: Multidetector CT imaging of the chest was performed during intravenous contrast administration.  CONTRAST:  54m OMNIPAQUE IOHEXOL 300 MG/ML  SOLN  COMPARISON:  CT chest 05/20/2014, 03/25/2014  FINDINGS: Cavitary mass right upper lobe shows interval enlargement. This contains an air-fluid level and thick enhancing walls with irregular margins. This mass now measures 4.4 x 4.0 cm. This may represent a lung abscess or infected tumor. Other lung nodules are stable.  Right upper lobe nodule 12 x 16 mm stable. Right lower lobe nodule 19 x 12 mm stable. Left upper lobe nodule anteriorly 13 mm stable. 1 cm left lower lobe nodule stable. 17 mm left medial lung base nodule stable.  Patchy airspace disease density in the lingula unchanged and may represent atelectasis or scarring.  Minimal pleural effusion bilaterally. Mild left lower lobe atelectasis.  Negative for mediastinal adenopathy.  No hilar adenopathy.  Port-A-Cath tip extends into the right atrium.  No mass in the upper abdomen.  Diffuse widespread sclerotic changes in the vertebral bodies compatible with bony metastatic disease, stable.  IMPRESSION: Significant enlargement of cavitary mass in the right upper lobe with air-fluid level.  This may represent abscess or infected tumor. Other lung nodules consistent with metastatic disease are stable.  Small pleural effusions. Left lower lobe and lingular atelectasis similar to the prior study.   Electronically Signed   By: Franchot Gallo M.D.   On: 07/03/2014  11:06    CBC  Recent Labs Lab 07/03/14 0812 07/04/14 0047  WBC 6.0 5.3  HGB 10.9* 9.8*  HCT 33.9* 31.0*  PLT 295 268  MCV 92.4 92.8  MCH 29.7 29.3  MCHC 32.2 31.6  RDW 17.5* 17.8*  LYMPHSABS 1.5  --   MONOABS 1.2*  --   EOSABS 0.0  --   BASOSABS 0.0  --     Chemistries   Recent Labs Lab 07/03/14 0812 07/04/14 0047  NA 136 141  K 3.2* 3.3*  CL 97 105  CO2 29 28  GLUCOSE 107* 114*  BUN 9 8  CREATININE 0.56 0.51  CALCIUM 8.7 8.0*  AST 17 12  ALT 13 10  ALKPHOS 55 44  BILITOT 0.6 0.5   ------------------------------------------------------------------------------------------------------------------ estimated creatinine clearance is 62.4 mL/min (by C-G formula based on Cr of 0.51). ------------------------------------------------------------------------------------------------------------------ No results for input(s): HGBA1C in the last 72 hours. ------------------------------------------------------------------------------------------------------------------ No results for input(s): CHOL, HDL, LDLCALC, TRIG, CHOLHDL, LDLDIRECT in the last 72 hours. ------------------------------------------------------------------------------------------------------------------ No results for input(s): TSH, T4TOTAL, T3FREE, THYROIDAB in the last 72 hours.  Invalid input(s): FREET3 ------------------------------------------------------------------------------------------------------------------ No results for input(s): VITAMINB12, FOLATE, FERRITIN, TIBC, IRON, RETICCTPCT in the last 72 hours.  Coagulation profile No results for input(s): INR, PROTIME in the last 168 hours.  No results for input(s): DDIMER in the last 72 hours.  Cardiac Enzymes No results for input(s): CKMB, TROPONINI, MYOGLOBIN in the last 168 hours.  Invalid input(s): CK ------------------------------------------------------------------------------------------------------------------ Invalid input(s):  POCBNP  No results for input(s): GLUCAP in the last 72 hours.   Gregary Blackard M.D. [facility] 07/04/2014, 11:49 AM  Pager: [phone]   Between 7am to 7pm - call Pager - [phone]  After 7pm go to [email or web address] - password [email or web address]  Call night coverage person covering after 7pm

## 2014-07-04 NOTE — Progress Notes (Signed)
Utilization Review Completed.  

## 2014-07-04 NOTE — Evaluation (Signed)
Clinical/Bedside Swallow Evaluation Patient Details  Name: [name] [name] MRN: [id number] Date of Birth: May 05, 1940  Today's Date: 07/04/2014 Time: SLP Start Time (ACUTE ONLY): 1000 SLP Stop Time (ACUTE ONLY): 1014 SLP Time Calculation (min) (ACUTE ONLY): 14 min  Past Medical History:  Past Medical History  Diagnosis Date  . Asthma   . Breast pain in female     throbbing  . Abdominal pain   . Breast lump in female   . Cancer     rt  . PE (pulmonary embolism) 03/26/2014  . Neuropathy   . GERD (gastroesophageal reflux disease)   . History of hiatal hernia    Past Surgical History:  Past Surgical History  Procedure Laterality Date  . Colonoscopy w/ polypectomy  12/11, 4/12  . Tonsillectomy    . Skin cancer excision  2011  . Bone density  2011  . Mastectomy      right july 2012  . Breast surgery      Right Mastectomy  . Lung biopsy  08/2013   HPI:  [name] [name] is a 74 y.o. female with a hx of breast cancer currently undergoing chemo at [facility], PE, GERD who presents to the ED with complaints of sore throat and fevers. In the ED, pt was found to have temp of 102.6 with hypotension with sbp into the 80's and tachycardia. No leukocytosis and no lactic acidosis. CXR shows interval enlargement of RUL cavitary mass suggesting infection.    Assessment / Plan / Recommendation Clinical Impression   Pt was seen for swallowing evaluation. Pt was directly observed with thin liquids, puree and solid consistencies without overt s/s of aspiration. Recommend pt initiate regular/thin liquid diet. No follow-up needed; speech will sign-off.    Aspiration Risk  Mild    Diet Recommendation Regular;Thin liquid   Liquid Administration via: Straw;Cup Medication Administration: Whole meds with liquid Supervision: Patient able to self feed Compensations: Slow rate;Small sips/bites Postural Changes and/or Swallow Maneuvers: Seated upright 90 degrees    Other   Recommendations Oral Care Recommendations: Oral care BID   Follow Up Recommendations  None    Frequency and Duration        Pertinent Vitals/Pain     SLP Swallow Goals     Swallow Study Prior Functional Status       General HPI: [name] [name] is a 74 y.o. female with a hx of breast cancer currently undergoing chemo at [facility], PE, GERD who presents to the ED with complaints of sore throat and fevers. In the ED, pt was found to have temp of 102.6 with hypotension with sbp into the 80's and tachycardia. No leukocytosis and no lactic acidosis. CXR shows interval enlargement of RUL cavitary mass suggesting infection.  Type of Study: Bedside swallow evaluation Previous Swallow Assessment: None documented Diet Prior to this Study: Regular;Thin liquids Respiratory Status: Room air History of Recent Intubation: No Behavior/Cognition: Alert;Cooperative;Pleasant mood Oral Cavity - Dentition: Adequate natural dentition Self-Feeding Abilities: Able to feed self Patient Positioning:  (Upright on side of the bed) Baseline Vocal Quality: Clear    Oral/Motor/Sensory Function Overall Oral Motor/Sensory Function: Appears within functional limits for tasks assessed Labial ROM: Within Functional Limits Labial Symmetry: Within Functional Limits Lingual ROM: Within Functional Limits Lingual Symmetry: Within Functional Limits Facial ROM: Within Functional Limits Facial Symmetry: Within Functional Limits   Ice Chips Ice chips: Not tested   Thin Liquid Thin Liquid: Within functional limits    Nectar Thick Nectar Thick Liquid:  Not tested   Honey Thick Honey Thick Liquid: Not tested   Puree Puree: Within functional limits   Solid   GO    Solid: Within functional limits       [name], [name] Llanas 07/04/2014,10:19 AM

## 2014-07-04 NOTE — Progress Notes (Signed)
CRITICAL VALUE ALERT  Critical value received:  + BC aerobic growing gram + cocci and clusters  Date of notification:  07/04/14  Time of notification:  8469  Critical value read back:Yes.    Nurse who received alert:  Dorena Cookey  MD notified (1st page):  Rai  Time of first page:  [phone]  MD notified (2nd page):  Time of second page:  Responding MD:  Tana Coast  Time MD responded:  [phone]

## 2014-07-05 DIAGNOSIS — R229 Localized swelling, mass and lump, unspecified: Secondary | ICD-10-CM

## 2014-07-05 DIAGNOSIS — R509 Fever, unspecified: Secondary | ICD-10-CM

## 2014-07-05 LAB — URINE CULTURE
Colony Count: NO GROWTH
Culture: NO GROWTH

## 2014-07-05 LAB — BASIC METABOLIC PANEL
Anion gap: 10 (ref 5–15)
CALCIUM: 8.6 mg/dL (ref 8.4–10.5)
CO2: 28 mmol/L (ref 19–32)
Chloride: 102 mmol/L (ref 96–112)
Creatinine, Ser: 0.51 mg/dL (ref 0.50–1.10)
GFR calc Af Amer: >90 mL/min (ref >90)
GFR calc non Af Amer: >90 mL/min (ref >90)
GLUCOSE: 91 mg/dL (ref 70–99)
Potassium: 3.3 mmol/L — ABNORMAL LOW (ref 3.5–5.1)
Sodium: 140 mmol/L (ref 135–145)

## 2014-07-05 MED ORDER — GUAIFENESIN-DM 100-10 MG/5ML PO SYRP: 5.0000 mL | ORAL_SOLUTION | ORAL | Status: DC | PRN

## 2014-07-05 MED ORDER — LEVOFLOXACIN IN D5W 750 MG/150ML IV SOLN: 750.0000 mg | INTRAVENOUS | Status: DC

## 2014-07-05 MED ORDER — VANCOMYCIN HCL IN DEXTROSE 1-5 GM/200ML-% IV SOLN: 1000.0000 mg | Freq: Two times a day (BID) | INTRAVENOUS | Status: DC

## 2014-07-05 MED ORDER — BENZONATATE 100 MG PO CAPS: 100.0000 mg | ORAL_CAPSULE | Freq: Three times a day (TID) | ORAL | Status: DC

## 2014-07-05 MED ORDER — IPRATROPIUM BROMIDE 0.02 % IN SOLN: 2.5000 mL | RESPIRATORY_TRACT | Status: DC | PRN

## 2014-07-05 MED ORDER — ACETAMINOPHEN 325 MG PO TABS: 650.0000 mg | ORAL_TABLET | Freq: Four times a day (QID) | ORAL | Status: DC | PRN

## 2014-07-05 MED ORDER — IPRATROPIUM BROMIDE 0.02 % IN SOLN
0.5000 mg | Freq: Four times a day (QID) | RESPIRATORY_TRACT | Status: DC
  Administered 2014-07-05 (×3): 0.5 mg via RESPIRATORY_TRACT
  Filled 2014-07-05 (×3): qty 2.5

## 2014-07-05 MED ORDER — BENZONATATE 100 MG PO CAPS
100.0000 mg | ORAL_CAPSULE | Freq: Three times a day (TID) | ORAL | Status: DC
  Administered 2014-07-05 (×2): 100 mg via ORAL
  Filled 2014-07-05 (×3): qty 1

## 2014-07-05 MED ORDER — IPRATROPIUM BROMIDE 0.02 % IN SOLN: 0.5000 mg | Freq: Four times a day (QID) | RESPIRATORY_TRACT | Status: DC

## 2014-07-05 MED ORDER — ALBUTEROL SULFATE (2.5 MG/3ML) 0.083% IN NEBU: 2.5000 mg | INHALATION_SOLUTION | Freq: Four times a day (QID) | RESPIRATORY_TRACT | Status: DC | PRN

## 2014-07-05 MED ORDER — GUAIFENESIN-DM 100-10 MG/5ML PO SYRP
5.0000 mL | ORAL_SOLUTION | ORAL | Status: DC | PRN
  Administered 2014-07-05: 5 mL via ORAL
  Filled 2014-07-05 (×2): qty 5

## 2014-07-05 MED ORDER — MENTHOL 3 MG MT LOZG
1.0000 | LOZENGE | OROMUCOSAL | Status: DC | PRN
  Filled 2014-07-05: qty 9

## 2014-07-05 MED ORDER — MENTHOL 3 MG MT LOZG: 1.0000 | LOZENGE | OROMUCOSAL | Status: DC | PRN

## 2014-07-05 NOTE — Progress Notes (Addendum)
[facility]                                                                              Patient Demographics  [name] [name], is a 74 y.o. female, DOB - February 10, 1941, TLX:[id number]  Admit date - 07/03/2014   Admitting Physician Donne Hazel, MD  Outpatient Primary MD for the patient is [facility], MD  LOS - 2   Chief Complaint  Patient presents with  . Sore Throat  . Fever       Brief HPI   Patient is a 74 year old female with breast cancer currently undergoing chemotherapy at [facility], lasted 3 weeks ago, history of pulmonary embolism, GERD presented to ED with complaints of sore throat and fevers. Patient was found to have temperature of 102.6 F with hypotension with SBP in 80s and tachycardia. No leukocytosis and no lactic acidosis. Patient was started on vancomycin, aztreonam and Levaquin in ED. UA also appears to be positive for UTI.   Assessment & Plan    Principal Problem:   Sepsis with healthcare associated pneumonia , likely postobstructive pneumonia due to cavitary right upper lobe tumor -Urine strep antigen negative, urine legionella antigen negative, HIV negative, urine culture negative - Continue IV Vancomycin, aztreonam, Levaquin  - CT chest showed significant enlargement of the cavitary mass in the right upper lobe with air fluid levels, may represent abscess or infected tumor. This was discussed in detail with the patient and her husband at the bedside. Patient reports that she knows about the right upper lobe tumor. -  Patient's records reviewed. Patient had CT chest on 06/13/14 at [facility] which had shown 2 x 2.1 cm right upper lobe cavitary tumor, 1.1 x1.4cm right middle lobe pulmonary nodule, left anterior pleural-based mass 1.2x 1.3cm, left lower lobe pleural-based mass 1.3 x1.8cm, and 1.5 x1.8cm.   -Blood cultures 1/2 [facility], Infectious disease consult called, discussed with Dr. Linus Salmons. Will evaluate patient today,  will narrow antibiotics - Placed on scheduled bronchodilators, Tessalon Perles, Cepacol  Addendum 3:50PM: Patient wants to be transferred to [facility] where her oncologist, Dr Valaria Good is. I called the transfer line number and spoke with the coordinator, currently awaiting conference call from accepting physician, Dr Lynnette Caffey.   Addendum 4:00pm: Spoke with Dr Julaine Hua covering for Dr Lynnette Caffey in detail on conference call, they are okay with patient coming to [facility] however currently there are no beds available. Floor/ admissions [facility] will be called  by [facility] if bed becomes available. Dr Julaine Hua also recommended to manage and discharge as planned from [facility] if bed doesn't get available in 2 days otherwise it would be inconvenient with all the transportation involved and admission process at [facility] and then discharge same or next day.   Spoke with patient, [name] [name], she agrees with all the above.  Active Problems:  Hypotension - Likely due to sepsis and relative adrenal insufficiency - BP now stable, hydrocortisone discontinued, continue oral prednisone maintenance dose    Breast cancer With pulmonary metastasis, pleural metastatic disease - Patient is receiving chemotherapy in [facility], was due today, patient has already called her oncologist at [facility] and  chemotherapy is postponed for now until she is medically stable. - Patient's oncolgist Dr Elzie Rings Kimmick    History of pulmonary embolism - Continue therapeutic Lovenox   GERD - cont pepcid  Code Status: full  Family Communication: Discussed in detail with the patient, all imaging results, lab results explained to the patient  And husband at the bedside   Disposition Plan: remain in SDU for higher acuity of care, hopefully will transfer to telemetry tomorrow  Time Spent in minutes 25 mins  Procedures  CT chest  Consults   Infectious disease, Dr. Linus Salmons  DVT Prophylaxis Lovenox   Medications  Scheduled Meds: . aztreonam  2  g Intravenous 3 times per day  . benzonatate  100 mg Oral TID  . enoxaparin  60 mg Subcutaneous Q12H  . famotidine  20 mg Oral Daily  . fluticasone  2 spray Each Nare Daily  . ipratropium  0.5 mg Nebulization Q6H  . levofloxacin (LEVAQUIN) IV  750 mg Intravenous Q24H  . predniSONE  30 mg Oral Q breakfast  . sodium chloride  3 mL Intravenous Q12H  . vancomycin  1,000 mg Intravenous Q12H   Continuous Infusions: . sodium chloride 75 mL/hr (07/05/14 0831)   PRN Meds:.acetaminophen **OR** acetaminophen, guaiFENesin-dextromethorphan, ipratropium, menthol-cetylpyridinium, ondansetron **OR** ondansetron (ZOFRAN) IV   Antibiotics   Anti-infectives    Start     Dose/Rate Route Frequency Ordered Stop   07/04/14 1200  levofloxacin (LEVAQUIN) IVPB 750 mg     750 mg 100 mL/hr over 90 Minutes Intravenous Every 24 hours 07/03/14 1134     07/04/14 0100  vancomycin (VANCOCIN) IVPB 1000 mg/200 mL premix     1,000 mg 200 mL/hr over 60 Minutes Intravenous Every 12 hours 07/03/14 1134     07/03/14 2200  aztreonam (AZACTAM) 2 g in dextrose 5 % 50 mL IVPB     2 g 100 mL/hr over 30 Minutes Intravenous 3 times per day 07/03/14 1527 07/11/14 2159   07/03/14 2100  aztreonam (AZACTAM) 1 g in dextrose 5 % 50 mL IVPB  Status:  Discontinued     1 g 100 mL/hr over 30 Minutes Intravenous 3 times per day 07/03/14 1134 07/03/14 1531   07/03/14 1130  levofloxacin (LEVAQUIN) IVPB 750 mg     750 mg 100 mL/hr over 90 Minutes Intravenous  Once 07/03/14 1119 07/03/14 1417   07/03/14 1130  aztreonam (AZACTAM) 2 g in dextrose 5 % 50 mL IVPB     2 g 100 mL/hr over 30 Minutes Intravenous  Once 07/03/14 1119 07/03/14 1334   07/03/14 1130  vancomycin (VANCOCIN) IVPB 1000 mg/200 mL premix     1,000 mg 200 mL/hr over 60 Minutes Intravenous  Once 07/03/14 1119 07/03/14 1508        Subjective:   [name] [name] was seen and examined today. Overnight had a rough night with wheezing and shortness of breath. Patient  denies dizziness, abdominal pain, N/V/D/C, new weakness, numbess, tingling. No fevers.  This morning feeling better, wheezing has improved, feeling sore throat is coming back.    Objective:   Blood pressure 140/76, pulse 94, temperature 98.4 F (36.9 C), temperature source Oral, resp. rate 20, height '5\' 3"'$  (1.6 m), weight 79.1 kg (174 lb 6.1 oz), SpO2 100 %.  Wt Readings from Last 3 Encounters:  07/03/14 79.1 kg (174 lb 6.1 oz)  05/20/14 76.204 kg (168 lb)  03/27/14 76.25 kg (168 lb 1.6 oz)     Intake/Output Summary (Last 24 hours) at  07/05/14 1216 Last data filed at 07/05/14 0900  Gross per 24 hour  Intake   2100 ml  Output   3400 ml  Net  -1300 ml    Exam  General: Alert and oriented x 3, NAD  HEENT:  PERRLA, EOMI, Anicteic Sclera, mucous membranes moist.   Neck: Supple, no JVD, no masses  CVS: S1 S2 clear, no MRG  Respiratory: dec BS at bases mild scattered wheezing  Abdomen: Soft, NT, ND, + bowel sounds  Ext: no cyanosis clubbing or edema  Neuro: AAOx3, Cr N's II- XII. Strength 5/5 upper and lower extremities bilaterally  Skin: No rashes  Psych: Somewhat anxious , alert and oriented x3    Data Review   Micro Results Recent Results (from the past 240 hour(s))  Blood Culture (routine x 2)     Status: None (Preliminary result)   Collection Time: 07/03/14 11:56 AM  Result Value Ref Range Status   Specimen Description BLOOD LEFT ASSIST CONTROL  Final   Special Requests BOTTLES DRAWN AEROBIC AND ANAEROBIC 5ML  Final   Culture   Final    GRAM POSITIVE COCCI IN CLUSTERS Note: Gram Stain Report Called to,Read Back By and Verified With: [facility] F RN AT [id number] BY CASTC Performed at [facility]    Report Status PENDING  Incomplete  Blood Culture (routine x 2)     Status: None (Preliminary result)   Collection Time: 07/03/14 12:20 PM  Result Value Ref Range Status   Specimen Description BLOOD LEFT ASSIST CONTROL  Final   Special Requests BOTTLES  DRAWN AEROBIC AND ANAEROBIC 10MLS  Final   Culture   Final           BLOOD CULTURE RECEIVED NO GROWTH TO DATE CULTURE WILL BE HELD FOR 5 DAYS BEFORE ISSUING A FINAL NEGATIVE REPORT Performed at [facility]    Report Status PENDING  Incomplete  MRSA PCR Screening     Status: None   Collection Time: 07/03/14  3:32 PM  Result Value Ref Range Status   MRSA by PCR NEGATIVE NEGATIVE Final    Comment:        The GeneXpert MRSA Assay (FDA approved for NASAL specimens only), is one component of a comprehensive MRSA colonization surveillance program. It is not intended to diagnose MRSA infection nor to guide or monitor treatment for MRSA infections.   Culture, Urine     Status: None   Collection Time: 07/03/14  3:38 PM  Result Value Ref Range Status   Specimen Description URINE, CLEAN CATCH  Final   Special Requests Immunocompromised  Final   Colony Count NO GROWTH Performed at [facility]   Final   Culture NO GROWTH Performed at [facility]   Final   Report Status 07/05/2014 FINAL  Final    Radiology Reports Dg Chest 2 View  07/03/2014   CLINICAL DATA:  Sore throat and fever.  Metastatic breast cancer.  EXAM: CHEST  2 VIEW  COMPARISON:  05/20/2014  FINDINGS: Progression of cavitary mass right upper lobe. Given the rapid growth, this could be due to infection however tumor is also possible. 1 cm right upper lobe nodule is unchanged in size. Left basilar nodule is better seen on prior CT.  Mild left lower lobe atelectasis. Small left effusion noted. Negative for heart failure.  Port-A-Cath tip in the right atrium is unchanged.  IMPRESSION: Interval enlargement of right upper lobe cavitary mass. This may represent infection given the rapid  growth.  1 cm right upper lobe nodule unchanged and may represent tumor or infection. Based on prior chest CT, multiple nodules are present suggestive of metastatic disease.   Electronically Signed   By: Franchot Gallo M.D.   On:  07/03/2014 08:48   Ct Chest W Contrast  07/03/2014   CLINICAL DATA:  Metastatic breast cancer. Enlarging right upper lobe nodule. Currently on chemotherapy.  EXAM: CT CHEST WITH CONTRAST  TECHNIQUE: Multidetector CT imaging of the chest was performed during intravenous contrast administration.  CONTRAST:  78m OMNIPAQUE IOHEXOL 300 MG/ML  SOLN  COMPARISON:  CT chest 05/20/2014, 03/25/2014  FINDINGS: Cavitary mass right upper lobe shows interval enlargement. This contains an air-fluid level and thick enhancing walls with irregular margins. This mass now measures 4.4 x 4.0 cm. This may represent a lung abscess or infected tumor. Other lung nodules are stable.  Right upper lobe nodule 12 x 16 mm stable. Right lower lobe nodule 19 x 12 mm stable. Left upper lobe nodule anteriorly 13 mm stable. 1 cm left lower lobe nodule stable. 17 mm left medial lung base nodule stable.  Patchy airspace disease density in the lingula unchanged and may represent atelectasis or scarring.  Minimal pleural effusion bilaterally. Mild left lower lobe atelectasis.  Negative for mediastinal adenopathy.  No hilar adenopathy.  Port-A-Cath tip extends into the right atrium.  No mass in the upper abdomen.  Diffuse widespread sclerotic changes in the vertebral bodies compatible with bony metastatic disease, stable.  IMPRESSION: Significant enlargement of cavitary mass in the right upper lobe with air-fluid level. This may represent abscess or infected tumor. Other lung nodules consistent with metastatic disease are stable.  Small pleural effusions. Left lower lobe and lingular atelectasis similar to the prior study.   Electronically Signed   By: CFranchot GalloM.D.   On: 07/03/2014 11:06    CBC  Recent Labs Lab 07/03/14 0812 07/04/14 0047  WBC 6.0 5.3  HGB 10.9* 9.8*  HCT 33.9* 31.0*  PLT 295 268  MCV 92.4 92.8  MCH 29.7 29.3  MCHC 32.2 31.6  RDW 17.5* 17.8*  LYMPHSABS 1.5  --   MONOABS 1.2*  --   EOSABS 0.0  --   BASOSABS  0.0  --     Chemistries   Recent Labs Lab 07/03/14 0812 07/04/14 0047 07/05/14 0849  NA 136 141 140  K 3.2* 3.3* 3.3*  CL 97 105 102  CO2 '29 28 28  '$ GLUCOSE 107* 114* 91  BUN 9 8 <5*  CREATININE 0.56 0.51 0.51  CALCIUM 8.7 8.0* 8.6  AST 17 12  --   ALT 13 10  --   ALKPHOS 55 44  --   BILITOT 0.6 0.5  --    ------------------------------------------------------------------------------------------------------------------ estimated creatinine clearance is 62.4 mL/min (by C-G formula based on Cr of 0.51). ------------------------------------------------------------------------------------------------------------------ No results for input(s): HGBA1C in the last 72 hours. ------------------------------------------------------------------------------------------------------------------ No results for input(s): CHOL, HDL, LDLCALC, TRIG, CHOLHDL, LDLDIRECT in the last 72 hours. ------------------------------------------------------------------------------------------------------------------ No results for input(s): TSH, T4TOTAL, T3FREE, THYROIDAB in the last 72 hours.  Invalid input(s): FREET3 ------------------------------------------------------------------------------------------------------------------ No results for input(s): VITAMINB12, FOLATE, FERRITIN, TIBC, IRON, RETICCTPCT in the last 72 hours.  Coagulation profile No results for input(s): INR, PROTIME in the last 168 hours.  No results for input(s): DDIMER in the last 72 hours.  Cardiac Enzymes No results for input(s): CKMB, TROPONINI, MYOGLOBIN in the last 168 hours.  Invalid input(s): CK ------------------------------------------------------------------------------------------------------------------ Invalid input(s): POCBNP  No results for  input(s): GLUCAP in the last 72 hours.   RAI,RIPUDEEP M.D. [facility] 07/05/2014, 12:16 PM  Pager: [phone]   Between 7am to 7pm - call Pager -  [phone]  After 7pm go to [email or web address] - password [email or web address]  Call night coverage person covering after 7pm

## 2014-07-05 NOTE — Progress Notes (Signed)
Rec'd TC from [place] with [facility] stating pt has a bed assignment.  Pt to go to [id number].  MD notified. ED SW notified for assistance with getting information gathered for transfer.

## 2014-07-05 NOTE — Progress Notes (Signed)
Have reviewed, with patient, discharge summary and instructions.  Transfer form complete. [facility] has been notified for transfer of patient.

## 2014-07-05 NOTE — Discharge Summary (Signed)
TRANSFER SUMMARY   [name] [name] GLO:[id number]:[id number]  is a 74 y.o. female  Outpatient Primary MD for the patient is MCNEILL,WENDY, MD Admission date: 07/03/2014 Transfer Date 07/05/2014 Admitting Physician Donne Hazel, MD   Place of Transfer [facility]  Accepting MD: Dr Morris/ Dr Julaine Hua Mode: Karen Chafe Condition: stable  Admission Diagnosis  Sore throat [J02.9] Sepsis, due to unspecified organism [A41.9]  Discharge Diagnosis    Sepsis with healthcare associated pneumonia postobstructive pneumonia due to cavitary right upper lobe tumor Hypotension due to sepsis and relative adrenal insufficiency Ca breast With pulmonary metastasis, pleural metastatic disease History of Pulmonary embolism on therapeutic lovenox  GERD  CODE STATUS : FULL CODE   Past Medical History  Diagnosis Date  . Asthma   . Breast pain in female     throbbing  . Abdominal pain   . Breast lump in female   . Cancer     rt  . PE (pulmonary embolism) 03/26/2014  . Neuropathy   . GERD (gastroesophageal reflux disease)   . History of hiatal hernia     Past Surgical History  Procedure Laterality Date  . Colonoscopy w/ polypectomy  12/11, 4/12  . Tonsillectomy    . Skin cancer excision  2011  . Bone density  2011  . Mastectomy      right july 2012  . Breast surgery      Right Mastectomy  . Lung biopsy  08/2013    Consults  Infectious disease, Dr [facility] Course See H&P for all details in brief, Patient is a 74 year old female with breast cancer currently undergoing chemotherapy at [facility], lasted 3 weeks ago, history of pulmonary embolism, GERD presented to ED with complaints of sore throat and fevers. Patient was found to have temperature of 102.6 F with hypotension with SBP in 80s and tachycardia. No leukocytosis and no lactic acidosis. Patient was started on vancomycin, aztreonam and Levaquin in ED. UA also appeared to be positive for UTI. Patient was admitted to  step down unit.    Sepsis with healthcare associated pneumonia , likely postobstructive pneumonia due to cavitary right upper lobe tumor Urine strep antigen was negative, urine legionella antigen was negative, HIV was negative, urine culture came back negative. Patient was placed on Vancomycin, aztreonam, Levaquin.   - CT chest showed significant enlargement of the cavitary mass in the right upper lobe with air fluid levels, may represent abscess or infected tumor. This was discussed in detail with the patient and her husband at the bedside. Patient reports that she knows about the right upper lobe tumor. - Patient's [facility] hospital records were reviewed. Patient had CT chest on 06/13/14 at [facility] which had shown 2 x 2.1 cm right upper lobe cavitary tumor, 1.1 x1.4cm right middle lobe pulmonary nodule, left anterior pleural-based mass 1.2x 1.3cm, left lower lobe pleural-based mass 1.3 x1.8cm, and 1.5 x1.8cm.  -Blood cultures 1/2 GPC in clusters, Infectious disease was consulted today on 4/21 and patient was evaluated by Dr Linus Salmons. Dr Linus Salmons recommended to stop aztreonam, can stop vancomycin if GPC is coagulase negative staph.  Continue levaquin for the pneumonia.  - Placed on scheduled bronchodilators, Tessalon Perles, Cepacol Later during the day at 3:45 pm, patient requested to transferred where her oncologist, Dr Valaria Good is. I spoke with Dr Julaine Hua covering for Dr Lynnette Caffey in detail on conference call and patient was accepted for transfer.   Hypotension - Likely due to sepsis and relative adrenal insufficiency -  BP now stable, hydrocortisone was discontinued today, continue oral prednisone maintenance dose '30mg'$  daily.    Breast cancer with pulmonary metastasis, pleural metastatic disease - Patient is receiving chemotherapy in [facility], was due today, patient has already called her oncologist at [facility] and chemotherapy is postponed for now until she is medically stable. -  Patient's oncolgist is Dr Valaria Good at [facility] ([place]   History of pulmonary embolism - Continue therapeutic Lovenox  GERD - cont pepcid    Subjective:   Missie Chern today has no headache,no chest or abdominal pain,no new weakness, tingling or numbness. She had rough night with shortness of breath and wheezing. She is not spiking any fevers today.   Objective:   Blood pressure 124/65, pulse 104, temperature 98.6 F (37 C), temperature source Oral, resp. rate 21, height '5\' 3"'$  (1.6 m), weight 79.1 kg (174 lb 6.1 oz), SpO2 96 %.  Intake/Output Summary (Last 24 hours) at 07/05/14 1853 Last data filed at 07/05/14 1700  Gross per 24 hour  Intake   2200 ml  Output   3000 ml  Net   -800 ml    Exam Awake Alert, Oriented x3, No new F.N deficits, Normal affect HEENT:EOMI, PERLA Neck: Supple,no JVD, No cervical lymphadenopathy appreciated.  Chest: decreased breath sounds at the bases CVS: RRR, tachycardia, No murmurs, rubs or gallops Abdomen: Normal bowel sounds, Soft, Non tender, No organomegaly, No rebound or guarding Extremeties: No Cyanosis, Clubbing or edema, No new Rash or bruise  Data Review  CBC w Diff: Lab Results  Component Value Date   WBC 5.3 07/04/2014   WBC 6.4 10/15/2010   HGB 9.8* 07/04/2014   HGB 12.9 10/15/2010   HCT 31.0* 07/04/2014   HCT 38.7 10/15/2010   PLT 268 07/04/2014   PLT 335 10/15/2010   LYMPHOPCT 26 07/03/2014   LYMPHOPCT 32.1 10/15/2010   MONOPCT 20* 07/03/2014   MONOPCT 5.9 10/15/2010   EOSPCT 0 07/03/2014   EOSPCT 1.7 10/15/2010   BASOPCT 1 07/03/2014   BASOPCT 0.4 10/15/2010   CMP: Lab Results  Component Value Date   NA 140 07/05/2014   K 3.3* 07/05/2014   CL 102 07/05/2014   CO2 28 07/05/2014   BUN <5* 07/05/2014   CREATININE 0.51 07/05/2014   PROT 4.3* 07/04/2014   ALBUMIN 2.1* 07/04/2014   BILITOT 0.5 07/04/2014   ALKPHOS 44 07/04/2014   AST 12 07/04/2014   ALT 10 07/04/2014  .  Significant Tests    Significant Diagnostic Studies:  Dg Chest 2 View  07/03/2014   CLINICAL DATA:  Sore throat and fever.  Metastatic breast cancer.  EXAM: CHEST  2 VIEW  COMPARISON:  05/20/2014  FINDINGS: Progression of cavitary mass right upper lobe. Given the rapid growth, this could be due to infection however tumor is also possible. 1 cm right upper lobe nodule is unchanged in size. Left basilar nodule is better seen on prior CT.  Mild left lower lobe atelectasis. Small left effusion noted. Negative for heart failure.  Port-A-Cath tip in the right atrium is unchanged.  IMPRESSION: Interval enlargement of right upper lobe cavitary mass. This may represent infection given the rapid growth.  1 cm right upper lobe nodule unchanged and may represent tumor or infection. Based on prior chest CT, multiple nodules are present suggestive of metastatic disease.   Electronically Signed   By: Franchot Gallo M.D.   On: 07/03/2014 08:48   Ct Chest W Contrast  07/03/2014   CLINICAL DATA:  Metastatic  breast cancer. Enlarging right upper lobe nodule. Currently on chemotherapy.  EXAM: CT CHEST WITH CONTRAST  TECHNIQUE: Multidetector CT imaging of the chest was performed during intravenous contrast administration.  CONTRAST:  68m OMNIPAQUE IOHEXOL 300 MG/ML  SOLN  COMPARISON:  CT chest 05/20/2014, 03/25/2014  FINDINGS: Cavitary mass right upper lobe shows interval enlargement. This contains an air-fluid level and thick enhancing walls with irregular margins. This mass now measures 4.4 x 4.0 cm. This may represent a lung abscess or infected tumor. Other lung nodules are stable.  Right upper lobe nodule 12 x 16 mm stable. Right lower lobe nodule 19 x 12 mm stable. Left upper lobe nodule anteriorly 13 mm stable. 1 cm left lower lobe nodule stable. 17 mm left medial lung base nodule stable.  Patchy airspace disease density in the lingula unchanged and may represent atelectasis or scarring.  Minimal pleural effusion bilaterally. Mild left lower  lobe atelectasis.  Negative for mediastinal adenopathy.  No hilar adenopathy.  Port-A-Cath tip extends into the right atrium.  No mass in the upper abdomen.  Diffuse widespread sclerotic changes in the vertebral bodies compatible with bony metastatic disease, stable.  IMPRESSION: Significant enlargement of cavitary mass in the right upper lobe with air-fluid level. This may represent abscess or infected tumor. Other lung nodules consistent with metastatic disease are stable.  Small pleural effusions. Left lower lobe and lingular atelectasis similar to the prior study.   Electronically Signed   By: CFranchot GalloM.D.   On: 07/03/2014 11:06    2D ECHO: done on 05/21/14  Study Conclusions  - Left ventricle: The cavity size was normal. There was mild concentric hypertrophy. Systolic function was vigorous. The estimated ejection fraction was in the range of 65% to 70%. Wall motion was normal; there were no regional wall motion abnormalities. The tissue Doppler parameters were normal. Left ventricular diastolic function parameters were normal. - Aortic valve: Trileaflet; mildly thickened, mildly calcified leaflets. There was no regurgitation. - Aortic root: The aortic root was normal in size. - Mitral valve: Structurally normal valve. There was trivial regurgitation. - Right ventricle: Systolic function was normal. - Right atrium: The atrium was normal in size. - Tricuspid valve: There was trivial regurgitation. - Pulmonic valve: There was trivial regurgitation. - Pulmonary arteries: Systolic pressure was within the normal range. - Inferior vena cava: The vessel was normal in size. - Pericardium, extracardiac: There was no pericardial effusion. Scheduled Meds: . benzonatate  100 mg Oral TID  . enoxaparin  60 mg Subcutaneous Q12H  . famotidine  20 mg Oral Daily  . fluticasone  2 spray Each Nare Daily  . ipratropium  0.5 mg Nebulization Q6H  . levofloxacin (LEVAQUIN) IV  750 mg  Intravenous Q24H  . predniSONE  30 mg Oral Q breakfast  . sodium chloride  3 mL Intravenous Q12H  . vancomycin  1,000 mg Intravenous Q12H   Continuous Infusions: . sodium chloride 75 mL/hr (07/05/14 0831)       The risks and  Benefits of transporting including disability, death and discomfort were discussed with the patient or health power of attorney and were agreeable to the plan and further management.  Total Time in preparing paper work, todays exam and data evaluation: 35 minutes  Signed: Jacarra Bobak M.D. [facility] 07/05/2014, 6:53 PM

## 2014-07-05 NOTE — Progress Notes (Signed)
CSW prepared transfer packet and delivered to unit.  Assisted with transfer form that requires patient and MD signature left with packet.  Informed RN that [facility] can be called once transfer form is completed.

## 2014-07-05 NOTE — Progress Notes (Signed)
Pt requesting transfer to [facility]. MD notified.

## 2014-07-05 NOTE — Consult Note (Signed)
[place] for Infectious Disease     Reason for Consult: fever    Referring Physician: Dr. Tana Coast  Principal Problem:   Sepsis Active Problems:   Breast cancer   History of pulmonary embolism   Fever   Sepsis due to pneumonia   . benzonatate  100 mg Oral TID  . enoxaparin  60 mg Subcutaneous Q12H  . famotidine  20 mg Oral Daily  . fluticasone  2 spray Each Nare Daily  . ipratropium  0.5 mg Nebulization Q6H  . levofloxacin (LEVAQUIN) IV  750 mg Intravenous Q24H  . predniSONE  30 mg Oral Q breakfast  . sodium chloride  3 mL Intravenous Q12H  . vancomycin  1,000 mg Intravenous Q12H    Recommendations: Will stop aztreonam Can stop vancomycin if GPC is CoNS Can continue levaquin for possible pneumonia    Assessment: She has fever but no localizing symptoms.  Only positive finding on wu is enlarged mass.  Possible infected area.    Antibiotics: Vancomycin, aztreonam, levaquin  HPI: [name] [name] is a 74 y.o. female with metastatic breast cancer to lungs currently undergoing chemotherapy at [facility] who noted a fever at home up to 102.  Saw her PCP NP who felt it was post nasal drip but called oncology and told to go to Ed.  Some sore throat but no cough until last night, was nonproductive, no diarrhea, no SOB, no pain or erythema at port site.  No weight loss.    Review of Systems: A comprehensive review of systems was negative.  Past Medical History  Diagnosis Date  . Asthma   . Breast pain in female     throbbing  . Abdominal pain   . Breast lump in female   . Cancer     rt  . PE (pulmonary embolism) 03/26/2014  . Neuropathy   . GERD (gastroesophageal reflux disease)   . History of hiatal hernia     History  Substance Use Topics  . Smoking status: Never Smoker   . Smokeless tobacco: Never Used  . Alcohol Use: No    No family history on file. Allergies  Allergen Reactions  . Albuterol Shortness Of Breath    Tachycardia, feels like she is  going to die  . Penicillins Other (See Comments)    Childhood reaction  . Codeine Nausea Only and Other (See Comments)    woosy  . Ibuprofen Other (See Comments)    Induces patient asthma  . Meperidine Nausea Only    Other reaction(s): Other (See Comments) "wiped out" and nausea  . Other     Unknown narcotic  . Phenobarbital Other (See Comments)    Erratic behavior, hyper, jittery  . Adhesive [Tape] Rash    OBJECTIVE: Blood pressure 129/70, pulse 109, temperature 98.4 F (36.9 C), temperature source Oral, resp. rate 23, height '5\' 3"'$  (1.6 m), weight 174 lb 6.1 oz (79.1 kg), SpO2 95 %. General: awake, alert, not ill appearing Skin: no rashes Lungs: CTA B Cor: RRR without m Abdomen: soft, nt, nd, +bs Ext: no edema  Microbiology: Recent Results (from the past 240 hour(s))  Blood Culture (routine x 2)     Status: None (Preliminary result)   Collection Time: 07/03/14 11:56 AM  Result Value Ref Range Status   Specimen Description BLOOD LEFT ASSIST CONTROL  Final   Special Requests BOTTLES DRAWN AEROBIC AND ANAEROBIC 5ML  Final   Culture   Final    GRAM POSITIVE COCCI  IN CLUSTERS Note: Gram Stain Report Called to,Read Back By and Verified With: [facility] F RN AT 1812 [id number] BY CASTC Performed at [facility]    Report Status PENDING  Incomplete  Blood Culture (routine x 2)     Status: None (Preliminary result)   Collection Time: 07/03/14 12:20 PM  Result Value Ref Range Status   Specimen Description BLOOD LEFT ASSIST CONTROL  Final   Special Requests BOTTLES DRAWN AEROBIC AND ANAEROBIC 10MLS  Final   Culture   Final           BLOOD CULTURE RECEIVED NO GROWTH TO DATE CULTURE WILL BE HELD FOR 5 DAYS BEFORE ISSUING A FINAL NEGATIVE REPORT Performed at [facility]    Report Status PENDING  Incomplete  MRSA PCR Screening     Status: None   Collection Time: 07/03/14  3:32 PM  Result Value Ref Range Status   MRSA by PCR NEGATIVE NEGATIVE Final    Comment:         The GeneXpert MRSA Assay (FDA approved for NASAL specimens only), is one component of a comprehensive MRSA colonization surveillance program. It is not intended to diagnose MRSA infection nor to guide or monitor treatment for MRSA infections.   Culture, Urine     Status: None   Collection Time: 07/03/14  3:38 PM  Result Value Ref Range Status   Specimen Description URINE, CLEAN CATCH  Final   Special Requests Immunocompromised  Final   Colony Count NO GROWTH Performed at [facility]   Final   Culture NO GROWTH Performed at [facility]   Final   Report Status 07/05/2014 FINAL  Final    Scharlene Gloss, [place] for Infectious Disease [facility] [email or web address] [phone] pager  [phone] cell 07/05/2014, 2:08 PM

## 2014-07-06 NOTE — Care Management Note (Signed)
    Page 1 of 1   07/06/2014     7:49:49 AM CARE MANAGEMENT NOTE 07/06/2014  Patient:  ASJA, FROMMER   Account Number:  [id number]  Date Initiated:  07/04/2014  Documentation initiated by:  Aleja Yearwood  Subjective/Objective Assessment:   dx PNA; lives with spouse    PCP  Cari Caraway     Action/Plan:   Anticipated DC Date:  07/08/2014   Anticipated DC Plan:  [place]  CM consult      Choice offered to / List presented to:             Status of service:  Completed, signed off Medicare Important Message given?  NA - LOS <3 / Initial given by admissions (If response is "NO", the following Medicare IM given date fields will be blank) Date Medicare IM given:   Medicare IM given by:   Date Additional Medicare IM given:   Additional Medicare IM given by:    Discharge Disposition:  ACUTE TO ACUTE TRANS  Per UR Regulation:  Reviewed for med. necessity/level of care/duration of stay  If discussed at [place] of Stay Meetings, dates discussed:    Comments:

## 2014-07-09 LAB — CULTURE, BLOOD (ROUTINE X 2): CULTURE: NO GROWTH

## 2014-07-10 LAB — CULTURE, BLOOD (ROUTINE X 2)

## 2014-09-10 ENCOUNTER — Other Ambulatory Visit: Payer: Self-pay

## 2015-01-15 ENCOUNTER — Encounter (HOSPITAL_COMMUNITY): Payer: Self-pay | Admitting: Emergency Medicine

## 2015-01-15 ENCOUNTER — Inpatient Hospital Stay (HOSPITAL_COMMUNITY): Payer: Medicare Other

## 2015-01-15 ENCOUNTER — Emergency Department (HOSPITAL_COMMUNITY): Payer: Medicare Other

## 2015-01-15 ENCOUNTER — Inpatient Hospital Stay (HOSPITAL_COMMUNITY)
Admission: EM | Admit: 2015-01-15 | Discharge: 2015-01-30 | DRG: 207 | Disposition: A | Discharge status: Hospice | Payer: Medicare Other | Attending: Pulmonary Disease | Admitting: Pulmonary Disease

## 2015-01-15 DIAGNOSIS — R112 Nausea with vomiting, unspecified: Secondary | ICD-10-CM | POA: Diagnosis not present

## 2015-01-15 DIAGNOSIS — J45909 Unspecified asthma, uncomplicated: Secondary | ICD-10-CM | POA: Diagnosis present

## 2015-01-15 DIAGNOSIS — Z888 Allergy status to other drugs, medicaments and biological substances status: Secondary | ICD-10-CM

## 2015-01-15 DIAGNOSIS — J910 Malignant pleural effusion: Secondary | ICD-10-CM | POA: Diagnosis present

## 2015-01-15 DIAGNOSIS — I4891 Unspecified atrial fibrillation: Secondary | ICD-10-CM | POA: Diagnosis present

## 2015-01-15 DIAGNOSIS — Z885 Allergy status to narcotic agent status: Secondary | ICD-10-CM

## 2015-01-15 DIAGNOSIS — Z85828 Personal history of other malignant neoplasm of skin: Secondary | ICD-10-CM

## 2015-01-15 DIAGNOSIS — J9601 Acute respiratory failure with hypoxia: Secondary | ICD-10-CM | POA: Diagnosis present

## 2015-01-15 DIAGNOSIS — Y95 Nosocomial condition: Secondary | ICD-10-CM | POA: Diagnosis present

## 2015-01-15 DIAGNOSIS — E860 Dehydration: Secondary | ICD-10-CM | POA: Diagnosis present

## 2015-01-15 DIAGNOSIS — E46 Unspecified protein-calorie malnutrition: Secondary | ICD-10-CM | POA: Diagnosis present

## 2015-01-15 DIAGNOSIS — E2740 Unspecified adrenocortical insufficiency: Secondary | ICD-10-CM | POA: Diagnosis present

## 2015-01-15 DIAGNOSIS — Z9911 Dependence on respirator [ventilator] status: Secondary | ICD-10-CM | POA: Insufficient documentation

## 2015-01-15 DIAGNOSIS — R0602 Shortness of breath: Secondary | ICD-10-CM | POA: Diagnosis present

## 2015-01-15 DIAGNOSIS — J449 Chronic obstructive pulmonary disease, unspecified: Secondary | ICD-10-CM | POA: Diagnosis present

## 2015-01-15 DIAGNOSIS — N179 Acute kidney failure, unspecified: Secondary | ICD-10-CM | POA: Diagnosis present

## 2015-01-15 DIAGNOSIS — F419 Anxiety disorder, unspecified: Secondary | ICD-10-CM | POA: Diagnosis present

## 2015-01-15 DIAGNOSIS — C7800 Secondary malignant neoplasm of unspecified lung: Secondary | ICD-10-CM | POA: Diagnosis present

## 2015-01-15 DIAGNOSIS — Z7901 Long term (current) use of anticoagulants: Secondary | ICD-10-CM

## 2015-01-15 DIAGNOSIS — Z86718 Personal history of other venous thrombosis and embolism: Secondary | ICD-10-CM

## 2015-01-15 DIAGNOSIS — R000 Tachycardia, unspecified: Secondary | ICD-10-CM | POA: Diagnosis present

## 2015-01-15 DIAGNOSIS — R579 Shock, unspecified: Secondary | ICD-10-CM | POA: Diagnosis present

## 2015-01-15 DIAGNOSIS — E876 Hypokalemia: Secondary | ICD-10-CM | POA: Diagnosis present

## 2015-01-15 DIAGNOSIS — I4892 Unspecified atrial flutter: Secondary | ICD-10-CM | POA: Diagnosis present

## 2015-01-15 DIAGNOSIS — C50919 Malignant neoplasm of unspecified site of unspecified female breast: Secondary | ICD-10-CM | POA: Diagnosis present

## 2015-01-15 DIAGNOSIS — G893 Neoplasm related pain (acute) (chronic): Secondary | ICD-10-CM | POA: Diagnosis present

## 2015-01-15 DIAGNOSIS — Z66 Do not resuscitate: Secondary | ICD-10-CM | POA: Diagnosis present

## 2015-01-15 DIAGNOSIS — K219 Gastro-esophageal reflux disease without esophagitis: Secondary | ICD-10-CM | POA: Diagnosis present

## 2015-01-15 DIAGNOSIS — N390 Urinary tract infection, site not specified: Secondary | ICD-10-CM | POA: Diagnosis present

## 2015-01-15 DIAGNOSIS — Z86711 Personal history of pulmonary embolism: Secondary | ICD-10-CM | POA: Diagnosis not present

## 2015-01-15 DIAGNOSIS — Z79899 Other long term (current) drug therapy: Secondary | ICD-10-CM | POA: Diagnosis not present

## 2015-01-15 DIAGNOSIS — E871 Hypo-osmolality and hyponatremia: Secondary | ICD-10-CM | POA: Diagnosis present

## 2015-01-15 DIAGNOSIS — Z4659 Encounter for fitting and adjustment of other gastrointestinal appliance and device: Secondary | ICD-10-CM

## 2015-01-15 DIAGNOSIS — C799 Secondary malignant neoplasm of unspecified site: Secondary | ICD-10-CM | POA: Diagnosis not present

## 2015-01-15 DIAGNOSIS — R0600 Dyspnea, unspecified: Secondary | ICD-10-CM | POA: Diagnosis not present

## 2015-01-15 DIAGNOSIS — Z880 Allergy status to penicillin: Secondary | ICD-10-CM

## 2015-01-15 DIAGNOSIS — Z7952 Long term (current) use of systemic steroids: Secondary | ICD-10-CM

## 2015-01-15 DIAGNOSIS — I5031 Acute diastolic (congestive) heart failure: Secondary | ICD-10-CM | POA: Diagnosis present

## 2015-01-15 DIAGNOSIS — Z6829 Body mass index (BMI) 29.0-29.9, adult: Secondary | ICD-10-CM | POA: Diagnosis not present

## 2015-01-15 DIAGNOSIS — J9690 Respiratory failure, unspecified, unspecified whether with hypoxia or hypercapnia: Secondary | ICD-10-CM | POA: Diagnosis present

## 2015-01-15 DIAGNOSIS — R6521 Severe sepsis with septic shock: Secondary | ICD-10-CM | POA: Diagnosis present

## 2015-01-15 DIAGNOSIS — C761 Malignant neoplasm of thorax: Secondary | ICD-10-CM | POA: Diagnosis present

## 2015-01-15 DIAGNOSIS — J9600 Acute respiratory failure, unspecified whether with hypoxia or hypercapnia: Secondary | ICD-10-CM

## 2015-01-15 DIAGNOSIS — A419 Sepsis, unspecified organism: Secondary | ICD-10-CM | POA: Diagnosis present

## 2015-01-15 DIAGNOSIS — J690 Pneumonitis due to inhalation of food and vomit: Secondary | ICD-10-CM | POA: Diagnosis present

## 2015-01-15 DIAGNOSIS — Z515 Encounter for palliative care: Secondary | ICD-10-CM | POA: Diagnosis present

## 2015-01-15 LAB — HEPARIN ANTI-XA: HEPARIN LMW: 0.12 [IU]/mL

## 2015-01-15 LAB — COMPREHENSIVE METABOLIC PANEL
ALBUMIN: 3.2 g/dL — AB (ref 3.5–5.0)
ALBUMIN: 2.8 g/dL — AB (ref 3.5–5.0)
ALK PHOS: 107 U/L (ref 38–126)
ALT: 16 U/L (ref 14–54)
ALT: 16 U/L (ref 14–54)
ANION GAP: 11 (ref 5–15)
AST: 20 U/L (ref 15–41)
AST: 24 U/L (ref 15–41)
Alkaline Phosphatase: 99 U/L (ref 38–126)
Anion gap: 12 (ref 5–15)
BILIRUBIN TOTAL: 0.4 mg/dL (ref 0.3–1.2)
BUN: 15 mg/dL (ref 6–20)
BUN: 15 mg/dL (ref 6–20)
CALCIUM: 8.9 mg/dL (ref 8.9–10.3)
CHLORIDE: 86 mmol/L — AB (ref 101–111)
CO2: 34 mmol/L — ABNORMAL HIGH (ref 22–32)
CO2: 30 mmol/L (ref 22–32)
CREATININE: 0.91 mg/dL (ref 0.44–1.00)
Calcium: 8.6 mg/dL — ABNORMAL LOW (ref 8.9–10.3)
Chloride: 84 mmol/L — ABNORMAL LOW (ref 101–111)
Creatinine, Ser: 0.59 mg/dL (ref 0.44–1.00)
GFR calc non Af Amer: >60 mL/min (ref >60)
GLUCOSE: 253 mg/dL — AB (ref 65–99)
Glucose, Bld: 227 mg/dL — ABNORMAL HIGH (ref 65–99)
POTASSIUM: 4.1 mmol/L (ref 3.5–5.1)
Potassium: 4.3 mmol/L (ref 3.5–5.1)
SODIUM: 128 mmol/L — AB (ref 135–145)
Sodium: 129 mmol/L — ABNORMAL LOW (ref 135–145)
TOTAL PROTEIN: 6.1 g/dL — AB (ref 6.5–8.1)
Total Bilirubin: 0.8 mg/dL (ref 0.3–1.2)
Total Protein: 5.4 g/dL — ABNORMAL LOW (ref 6.5–8.1)

## 2015-01-15 LAB — BLOOD GAS, ARTERIAL
Acid-Base Excess: 3.1 mmol/L — ABNORMAL HIGH (ref 0.0–2.0)
BICARBONATE: 28 meq/L — AB (ref 20.0–24.0)
Drawn by: 295031
FIO2: 1
O2 Saturation: 99.8 %
PATIENT TEMPERATURE: 98.6
PCO2 ART: 47.1 mmHg — AB (ref 35.0–45.0)
PEEP/CPAP: 5 cmH2O
PH ART: 7.392 (ref 7.350–7.450)
RATE: 20 resp/min
TCO2: 25.8 mmol/L (ref 0–100)
VT: 450 mL
pO2, Arterial: 368 mmHg — ABNORMAL HIGH (ref 80.0–100.0)

## 2015-01-15 LAB — GLUCOSE, CAPILLARY: GLUCOSE-CAPILLARY: 216 mg/dL — AB (ref 65–99)

## 2015-01-15 LAB — PROTIME-INR
INR: 1.08 (ref 0.00–1.49)
INR: 1.12 (ref 0.00–1.49)
Prothrombin Time: 14.2 seconds (ref 11.6–15.2)
Prothrombin Time: 14.6 seconds (ref 11.6–15.2)

## 2015-01-15 LAB — LACTIC ACID, PLASMA: LACTIC ACID, VENOUS: 4.5 mmol/L — AB (ref 0.5–2.0)

## 2015-01-15 LAB — CBC WITH DIFFERENTIAL/PLATELET
BASOS PCT: 0 %
Basophils Absolute: 0 10*3/uL (ref 0.0–0.1)
EOS ABS: 0 10*3/uL (ref 0.0–0.7)
Eosinophils Relative: 0 %
HEMATOCRIT: 33.5 % — AB (ref 36.0–46.0)
HEMOGLOBIN: 10.5 g/dL — AB (ref 12.0–15.0)
LYMPHS ABS: 0.5 10*3/uL — AB (ref 0.7–4.0)
LYMPHS PCT: 6 %
MCH: 30.1 pg (ref 26.0–34.0)
MCHC: 31.3 g/dL (ref 30.0–36.0)
MCV: 96 fL (ref 78.0–100.0)
MONO ABS: 0.5 10*3/uL (ref 0.1–1.0)
Monocytes Relative: 6 %
NEUTROS PCT: 88 %
NRBC: 14 /100{WBCs} — AB
Neutro Abs: 8 10*3/uL — ABNORMAL HIGH (ref 1.7–7.7)
Platelets: 380 10*3/uL (ref 150–400)
RBC: 3.49 MIL/uL — ABNORMAL LOW (ref 3.87–5.11)
RDW: 20.2 % — AB (ref 11.5–15.5)
WBC Morphology: INCREASED
WBC: 9 10*3/uL (ref 4.0–10.5)

## 2015-01-15 LAB — AMYLASE: Amylase: 103 U/L — ABNORMAL HIGH (ref 28–100)

## 2015-01-15 LAB — CBC
HEMATOCRIT: 35.9 % — AB (ref 36.0–46.0)
HEMOGLOBIN: 11 g/dL — AB (ref 12.0–15.0)
MCH: 30.2 pg (ref 26.0–34.0)
MCHC: 30.6 g/dL (ref 30.0–36.0)
MCV: 98.6 fL (ref 78.0–100.0)
Platelets: 459 10*3/uL — ABNORMAL HIGH (ref 150–400)
RBC: 3.64 MIL/uL — ABNORMAL LOW (ref 3.87–5.11)
RDW: 20.7 % — AB (ref 11.5–15.5)
WBC: 10.4 10*3/uL (ref 4.0–10.5)

## 2015-01-15 LAB — MAGNESIUM: Magnesium: 1.5 mg/dL — ABNORMAL LOW (ref 1.7–2.4)

## 2015-01-15 LAB — PHOSPHORUS: PHOSPHORUS: 5.4 mg/dL — AB (ref 2.5–4.6)

## 2015-01-15 LAB — I-STAT CG4 LACTIC ACID, ED: Lactic Acid, Venous: 4.97 mmol/L (ref 0.5–2.0)

## 2015-01-15 LAB — APTT
APTT: 35 s (ref 24–37)
aPTT: 26 seconds (ref 24–37)

## 2015-01-15 LAB — LIPASE, BLOOD: Lipase: 24 U/L (ref 11–51)

## 2015-01-15 LAB — PROCALCITONIN: PROCALCITONIN: 0.28 ng/mL

## 2015-01-15 LAB — D-DIMER, QUANTITATIVE: D-Dimer, Quant: 1.2 ug/mL-FEU — ABNORMAL HIGH (ref 0.00–0.48)

## 2015-01-15 LAB — MRSA PCR SCREENING: MRSA BY PCR: NEGATIVE

## 2015-01-15 LAB — TROPONIN I: TROPONIN I: 0.05 ng/mL — AB (ref <0.031)

## 2015-01-15 LAB — I-STAT TROPONIN, ED: TROPONIN I, POC: 0.03 ng/mL (ref 0.00–0.08)

## 2015-01-15 LAB — TSH: TSH: 0.751 u[IU]/mL (ref 0.350–4.500)

## 2015-01-15 MED ORDER — LIDOCAINE HCL (CARDIAC) 20 MG/ML IV SOLN
INTRAVENOUS | Status: AC
  Filled 2015-01-15: qty 5

## 2015-01-15 MED ORDER — PANTOPRAZOLE SODIUM 40 MG IV SOLR
40.0000 mg | INTRAVENOUS | Status: DC
  Administered 2015-01-15 – 2015-01-17 (×3): 40 mg via INTRAVENOUS
  Filled 2015-01-15 (×3): qty 40

## 2015-01-15 MED ORDER — DEXTROSE 5 % IV SOLN
0.0000 ug/min | INTRAVENOUS | Status: DC
  Filled 2015-01-15: qty 4

## 2015-01-15 MED ORDER — ADENOSINE 6 MG/2ML IV SOLN
6.0000 mg | Freq: Once | INTRAVENOUS | Status: AC
  Administered 2015-01-15: 6 mg via INTRAVENOUS

## 2015-01-15 MED ORDER — PANTOPRAZOLE SODIUM 40 MG PO TBEC: 40.0000 mg | DELAYED_RELEASE_TABLET | Freq: Every day | ORAL | Status: DC

## 2015-01-15 MED ORDER — VANCOMYCIN HCL IN DEXTROSE 750-5 MG/150ML-% IV SOLN
750.0000 mg | Freq: Two times a day (BID) | INTRAVENOUS | Status: DC
  Administered 2015-01-16 – 2015-01-17 (×4): 750 mg via INTRAVENOUS
  Filled 2015-01-15 (×5): qty 150

## 2015-01-15 MED ORDER — PHENYLEPHRINE HCL 10 MG/ML IJ SOLN
30.0000 ug/min | INTRAVENOUS | Status: DC
  Administered 2015-01-15: 100 ug/min via INTRAVENOUS
  Filled 2015-01-15: qty 1

## 2015-01-15 MED ORDER — AMIODARONE LOAD VIA INFUSION
150.0000 mg | Freq: Once | INTRAVENOUS | Status: AC
  Administered 2015-01-15: 150 mg via INTRAVENOUS
  Filled 2015-01-15: qty 83.34

## 2015-01-15 MED ORDER — ACETAMINOPHEN 500 MG PO TABS
1000.0000 mg | ORAL_TABLET | Freq: Four times a day (QID) | ORAL | Status: DC | PRN
  Administered 2015-01-16 – 2015-01-26 (×7): 1000 mg
  Filled 2015-01-15 (×8): qty 2

## 2015-01-15 MED ORDER — FENTANYL CITRATE (PF) 100 MCG/2ML IJ SOLN
100.0000 ug | INTRAMUSCULAR | Status: AC | PRN
  Administered 2015-01-15 (×2): 50 ug via INTRAVENOUS
  Administered 2015-01-15: 25 ug via INTRAVENOUS
  Filled 2015-01-15 (×3): qty 2

## 2015-01-15 MED ORDER — HEPARIN (PORCINE) IN NACL 100-0.45 UNIT/ML-% IJ SOLN
900.0000 [IU]/h | INTRAMUSCULAR | Status: DC
  Administered 2015-01-15: 1100 [IU]/h via INTRAVENOUS
  Filled 2015-01-15: qty 250

## 2015-01-15 MED ORDER — EPINEPHRINE HCL 0.1 MG/ML IJ SOSY
0.5000 mg | PREFILLED_SYRINGE | Freq: Once | INTRAMUSCULAR | Status: AC
  Administered 2015-01-15: 0.5 mg via INTRAVENOUS

## 2015-01-15 MED ORDER — SUCCINYLCHOLINE CHLORIDE 20 MG/ML IJ SOLN
INTRAMUSCULAR | Status: AC
  Administered 2015-01-15: 120 mg
  Filled 2015-01-15: qty 1

## 2015-01-15 MED ORDER — AZTREONAM 2 G IJ SOLR: 2.0000 g | Freq: Three times a day (TID) | INTRAMUSCULAR | Status: DC

## 2015-01-15 MED ORDER — ONDANSETRON HCL 4 MG/2ML IJ SOLN
4.0000 mg | Freq: Once | INTRAMUSCULAR | Status: AC
  Administered 2015-01-15: 4 mg via INTRAVENOUS
  Filled 2015-01-15: qty 2

## 2015-01-15 MED ORDER — HYDROCORTISONE NA SUCCINATE PF 100 MG IJ SOLR
100.0000 mg | Freq: Once | INTRAMUSCULAR | Status: AC
  Administered 2015-01-15: 100 mg via INTRAVENOUS
  Filled 2015-01-15: qty 2

## 2015-01-15 MED ORDER — MIDAZOLAM HCL 2 MG/2ML IJ SOLN
1.0000 mg | Freq: Once | INTRAMUSCULAR | Status: AC
  Administered 2015-01-15: 1 mg via INTRAVENOUS

## 2015-01-15 MED ORDER — DILTIAZEM LOAD VIA INFUSION
15.0000 mg | Freq: Once | INTRAVENOUS | Status: AC
  Administered 2015-01-15: 15 mg via INTRAVENOUS
  Filled 2015-01-15: qty 15

## 2015-01-15 MED ORDER — PROMETHAZINE HCL 25 MG/ML IJ SOLN
12.5000 mg | Freq: Once | INTRAMUSCULAR | Status: DC
  Filled 2015-01-15: qty 1

## 2015-01-15 MED ORDER — MIDAZOLAM HCL 2 MG/2ML IJ SOLN
1.0000 mg | INTRAMUSCULAR | Status: DC | PRN
  Administered 2015-01-15: 1 mg via INTRAVENOUS
  Filled 2015-01-15 (×3): qty 2

## 2015-01-15 MED ORDER — AMIODARONE HCL IN DEXTROSE 360-4.14 MG/200ML-% IV SOLN
30.0000 mg/h | INTRAVENOUS | Status: DC
  Administered 2015-01-16 – 2015-01-19 (×7): 30 mg/h via INTRAVENOUS
  Filled 2015-01-15 (×9): qty 200

## 2015-01-15 MED ORDER — MIDAZOLAM HCL 2 MG/2ML IJ SOLN
INTRAMUSCULAR | Status: AC
  Filled 2015-01-15: qty 2

## 2015-01-15 MED ORDER — NOREPINEPHRINE BITARTRATE 1 MG/ML IV SOLN
0.0000 ug/min | Freq: Once | INTRAVENOUS | Status: AC
  Administered 2015-01-15: 2 ug/min via INTRAVENOUS
  Filled 2015-01-15: qty 4

## 2015-01-15 MED ORDER — ADENOSINE 6 MG/2ML IV SOLN
INTRAVENOUS | Status: AC
  Administered 2015-01-15: 6 mg via INTRAVENOUS
  Filled 2015-01-15: qty 10

## 2015-01-15 MED ORDER — ACETAMINOPHEN 325 MG PO TABS: 650.0000 mg | ORAL_TABLET | ORAL | Status: DC | PRN

## 2015-01-15 MED ORDER — DEXTROSE 5 % IV SOLN
2.0000 g | Freq: Three times a day (TID) | INTRAVENOUS | Status: DC
  Administered 2015-01-15 – 2015-01-18 (×8): 2 g via INTRAVENOUS
  Filled 2015-01-15 (×9): qty 2

## 2015-01-15 MED ORDER — DEXTROSE 5 % IV SOLN
500.0000 mg | INTRAVENOUS | Status: DC
  Administered 2015-01-15: 500 mg via INTRAVENOUS
  Filled 2015-01-15: qty 500

## 2015-01-15 MED ORDER — DEXTROSE 5 % IV SOLN: 2.0000 g | Freq: Three times a day (TID) | INTRAVENOUS | Status: DC

## 2015-01-15 MED ORDER — HYDROCORTISONE NA SUCCINATE PF 100 MG IJ SOLR
100.0000 mg | Freq: Three times a day (TID) | INTRAMUSCULAR | Status: DC
  Administered 2015-01-16 – 2015-01-18 (×7): 100 mg via INTRAVENOUS
  Filled 2015-01-15 (×7): qty 2

## 2015-01-15 MED ORDER — HYDROCORTISONE NA SUCCINATE PF 100 MG IJ SOLR
100.0000 mg | Freq: Three times a day (TID) | INTRAMUSCULAR | Status: DC
  Filled 2015-01-15 (×2): qty 2

## 2015-01-15 MED ORDER — PHENYLEPHRINE HCL 10 MG/ML IJ SOLN
30.0000 ug/min | INTRAVENOUS | Status: DC
  Administered 2015-01-16: 110 ug/min via INTRAVENOUS
  Administered 2015-01-16: 200 ug/min via INTRAVENOUS
  Administered 2015-01-16: 100 ug/min via INTRAVENOUS
  Administered 2015-01-16: 44.533 ug/min via INTRAVENOUS
  Administered 2015-01-16: 90 ug/min via INTRAVENOUS
  Administered 2015-01-16: 120 ug/min via INTRAVENOUS
  Administered 2015-01-17: 65 ug/min via INTRAVENOUS
  Filled 2015-01-15 (×7): qty 4

## 2015-01-15 MED ORDER — VANCOMYCIN HCL IN DEXTROSE 1-5 GM/200ML-% IV SOLN: 1000.0000 mg | Freq: Two times a day (BID) | INTRAVENOUS | Status: DC

## 2015-01-15 MED ORDER — SODIUM CHLORIDE 0.9 % IV SOLN
INTRAVENOUS | Status: DC
  Administered 2015-01-16: 1000 mL via INTRAVENOUS

## 2015-01-15 MED ORDER — ROCURONIUM BROMIDE 50 MG/5ML IV SOLN
INTRAVENOUS | Status: AC
  Filled 2015-01-15: qty 2

## 2015-01-15 MED ORDER — AMIODARONE HCL IN DEXTROSE 360-4.14 MG/200ML-% IV SOLN
60.0000 mg/h | INTRAVENOUS | Status: AC
  Administered 2015-01-15: 60 mg/h via INTRAVENOUS

## 2015-01-15 MED ORDER — FENTANYL CITRATE (PF) 100 MCG/2ML IJ SOLN
100.0000 ug | INTRAMUSCULAR | Status: DC | PRN
Start: 2015-01-15 — End: 2015-01-19
  Administered 2015-01-15 – 2015-01-16 (×6): 100 ug via INTRAVENOUS
  Filled 2015-01-15 (×6): qty 2

## 2015-01-15 MED ORDER — DILTIAZEM HCL 100 MG IV SOLR
5.0000 mg/h | INTRAVENOUS | Status: DC
  Administered 2015-01-15: 7.5 mg/h via INTRAVENOUS
  Administered 2015-01-15: 5 mg/h via INTRAVENOUS
  Filled 2015-01-15: qty 100

## 2015-01-15 MED ORDER — ASPIRIN 300 MG RE SUPP: 300.0000 mg | RECTAL | Status: AC

## 2015-01-15 MED ORDER — FENTANYL CITRATE (PF) 100 MCG/2ML IJ SOLN
100.0000 ug | Freq: Once | INTRAMUSCULAR | Status: AC
  Administered 2015-01-15: 50 ug via INTRAVENOUS

## 2015-01-15 MED ORDER — ETOMIDATE 2 MG/ML IV SOLN
INTRAVENOUS | Status: AC
  Administered 2015-01-15: 20 mg
  Filled 2015-01-15: qty 20

## 2015-01-15 MED ORDER — MIDAZOLAM HCL 2 MG/2ML IJ SOLN
1.0000 mg | INTRAMUSCULAR | Status: DC | PRN
  Administered 2015-01-16 (×3): 1 mg via INTRAVENOUS
  Filled 2015-01-15: qty 2

## 2015-01-15 MED ORDER — NOREPINEPHRINE BITARTRATE 1 MG/ML IV SOLN
2.0000 ug/min | INTRAVENOUS | Status: DC
  Administered 2015-01-15: 20 ug/min via INTRAVENOUS
  Filled 2015-01-15: qty 4

## 2015-01-15 MED ORDER — VANCOMYCIN HCL IN DEXTROSE 1-5 GM/200ML-% IV SOLN
1000.0000 mg | INTRAVENOUS | Status: AC
  Administered 2015-01-15: 1000 mg via INTRAVENOUS
  Filled 2015-01-15: qty 200

## 2015-01-15 MED ORDER — ASPIRIN 81 MG PO CHEW
324.0000 mg | CHEWABLE_TABLET | ORAL | Status: AC
Start: 2015-01-15 — End: 2015-01-16

## 2015-01-15 MED ORDER — SODIUM CHLORIDE 0.9 % IV BOLUS (SEPSIS): 1000.0000 mL | Freq: Once | INTRAVENOUS | Status: DC

## 2015-01-15 MED ORDER — SODIUM CHLORIDE 0.9 % IV SOLN
250.0000 mL | INTRAVENOUS | Status: DC | PRN
Start: 2015-01-15 — End: 2015-01-16

## 2015-01-15 MED ORDER — SODIUM CHLORIDE 0.9 % IV SOLN
1000.0000 mL | INTRAVENOUS | Status: DC
  Administered 2015-01-15: 1000 mL via INTRAVENOUS

## 2015-01-15 MED ORDER — MIDAZOLAM HCL 2 MG/2ML IJ SOLN
2.0000 mg | Freq: Once | INTRAMUSCULAR | Status: AC
  Administered 2015-01-15: 1 mg via INTRAVENOUS
  Filled 2015-01-15: qty 2

## 2015-01-15 MED FILL — Medication: Qty: 1 | Status: AC

## 2015-01-15 NOTE — Progress Notes (Signed)
Pt transported from ED to [facility] without incident on vent 100% fio2.  Pt tolerated well.  Fio2 turned back down to 50% per previous documentation.

## 2015-01-15 NOTE — ED Notes (Signed)
Per EMS: Pt w/ hx of breast cancer. Was drinking orange juice and aspirated.  Upon arrival, pt was diaphoretic and was in SVT at a rate of 160s-180s.  Vagal maneuvers unsuccessful.  No pain.

## 2015-01-15 NOTE — Progress Notes (Signed)
[place] Progress Note Patient Name: [name] [name] DOB: 12/23/1940 MRN: [id number]   Date of Service  01/15/2015  HPI/Events of Note  Hypotensive  eICU Interventions  Neo added due to tachycardia     Intervention Category Major Interventions: Other:  YACOUB,WESAM 01/15/2015, 10:08 PM

## 2015-01-15 NOTE — Consult Note (Signed)
Admit date: 01/15/2015 Referring Physician  Dr. Vaughan Browner Primary Cardiologist  Remotely Dr. 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 She is being followed at [facility] and has failed multiple rounds of chemotherapy. She is currently getting palliative chemotherapy with Gemzar staring on 10/04/14. She also has history of PE in January 2016. She is on Lovenox anticoagulation for that. She is being monitored at the anticoagulation medication clinic at [facility] by [place] levels and had been therapeutic as per the last report on 12/26/14 without any missed doses per her husband.    She was admitted in April 2016 with an influenza, HCAP, postobstructive pneumonia. She was bronched at [facility] for that but no bacterial organism identified. She is on Bactrim as an outpatient for unclear reasons. As per the husband she had been declining rapidly over the past few weeks with with weakness, loss of appetite malaise and very poor PO intake. She is also on Prednisone at 87m daily.    According to her husband,  She was drinking and choked and became SOB with coughing.  She was unable to catch her breath and called 911.  On arrival of EMS, she was diaphoretic with a heart rate on it and then vomited and aspirated.  She developed severe respiratory distress      PMH:   Past Medical History  Diagnosis Date  . Asthma   . Breast pain in female     throbbing  . Abdominal pain   . Breast lump in female   . Cancer ([place]     [place]  . PE (pulmonary embolism) 03/26/2014  . [place]   . GERD (gastroesophageal reflux disease)   . History of hiatal hernia      PSH:   Past Surgical History  Procedure Laterality Date  . Colonoscopy w/ polypectomy  12/11, 4/12  . Tonsillectomy    . Skin cancer excision  2011  . Bone density  2011  . Mastectomy      right july  2012  . Breast surgery      Right Mastectomy  . Lung biopsy  08/2013    Allergies:  Albuterol; Penicillins; Albuterol sulfate; Codeine; Ibuprofen; Meperidine; Other; Phenobarbital; and Adhesive Prior to Admit Meds:   Prescriptions prior to admission  Medication Sig Dispense Refill Last Dose  . acetaminophen (TYLENOL) 325 MG tablet Take 2 tablets (650 mg total) by mouth every 6 (six) hours as needed for mild pain (or Fever >/= 101).   01/14/2015 at Unknown time  . beclomethasone (QVAR) 40 MCG/ACT inhaler Inhale 2 puffs into the lungs 2 (two) times daily as needed (SOB).    months  . Calcium Carbonate-Vitamin D (CALCIUM + D PO) Take 2 tablets by mouth daily.   Past Week at Unknown time  . calcium elemental as carbonate (TUMS ULTRA 1000) 400 MG chewable tablet Chew 1,000-2,000 mg by mouth 3 (three) times daily as needed for heartburn.   01/14/2015 at Unknown time  . enoxaparin (LOVENOX) 60 MG/0.6ML injection Inject 50 mg into the skin daily.    01/14/2015 at 2100  . furosemide (LASIX) 20 MG tablet Take 20 mg by mouth daily.   01/15/2015 at Unknown time  . Glucosamine HCl-MSM 1500-500 MG/30ML LIQD Take 30 mLs by mouth daily.   01/14/2015 at Unknown time  . ipratropium (ATROVENT) 0.02 % nebulizer solution Take 2.5 mLs (0.5 mg  total) by nebulization every 6 (six) hours. 75 mL 12 months  . lidocaine-prilocaine (EMLA) cream Apply 1 application topically once.    months  . Multiple Vitamin (MULTIVITAMIN WITH MINERALS) TABS tablet Take 1 tablet by mouth daily.   01/15/2015 at Unknown time  . nystatin (MYCOSTATIN) 100000 UNIT/ML suspension Take 5 mLs by mouth 4 (four) times daily - after meals and at bedtime.   01/14/2015 at Unknown time  . ondansetron (ZOFRAN) 4 MG tablet Take 4 mg by mouth every 8 (eight) hours as needed for nausea.    Past Month at Unknown time  . predniSONE (DELTASONE) 10 MG tablet Take 30 mg by mouth daily with breakfast.    01/15/2015 at Unknown time  . PRESCRIPTION MEDICATION Patient  goes to [facility] to get chemo.  Patient goes 2 weeks on and 1 weeks off   01/09/2015  . ranitidine (ZANTAC) 150 MG tablet Take 150 mg by mouth 2 (two) times daily.   01/14/2015 at Unknown time  . sulfamethoxazole-trimethoprim (BACTRIM,SEPTRA) 400-80 MG tablet Take 1 tablet by mouth daily.   01/15/2015 at Unknown time  . benzonatate (TESSALON) 100 MG capsule Take 1 capsule (100 mg total) by mouth 3 (three) times daily. (Patient not taking: Reported on 01/15/2015) 20 capsule 0   . clotrimazole (LOTRIMIN) 1 % cream Apply topically 2 (two) times daily. (Patient not taking: Reported on 01/15/2015) 30 g 0 Past Week at Unknown time  . guaiFENesin-dextromethorphan (ROBITUSSIN DM) 100-10 MG/5ML syrup Take 5 mLs by mouth every 4 (four) hours as needed for cough. (Patient not taking: Reported on 01/15/2015) 118 mL 0   . levofloxacin (LEVAQUIN) 750 MG/150ML SOLN Inject 150 mLs (750 mg total) into the vein daily. (Patient not taking: Reported on 01/15/2015) 1000 mL    . menthol-cetylpyridinium (CEPACOL) 3 MG lozenge Take 1 lozenge (3 mg total) by mouth as needed for sore throat. (Patient not taking: Reported on 01/15/2015) 100 tablet 12   . urea (CARMOL) 10 % cream Apply topically as needed. Apply on palm (Patient not taking: Reported on 01/15/2015) 71 g 0 Past Week at Unknown time  . vancomycin (VANCOCIN) 1 GM/200ML SOLN Inject 200 mLs (1,000 mg total) into the vein every 12 (twelve) hours. (Patient not taking: Reported on 01/15/2015) 4000 mL     Fam HX:   History reviewed. No pertinent family history. Social HX:    Social History   Social History  . Marital Status: Married    Spouse Name: N/A  . Number of Children: N/A  . Years of Education: N/A   Occupational History  . Not on file.   Social History Main Topics  . Smoking status: Never Smoker   . Smokeless tobacco: Never Used  . Alcohol Use: No  . Drug Use: No  . Sexual Activity: Yes   Other Topics Concern  . Not on file   Social History  Narrative     ROS:  All 11 ROS were addressed and are negative except what is stated in the HPI  Physical Exam: Blood pressure 89/62, pulse 48, temperature 99 F (37.2 C), temperature source Axillary, resp. rate 19, height 5' 2" (1.575 m), weight 162 lb 7.7 oz (73.7 kg), SpO2 100 %.    General: Intubated and sedated but moves all extremities Head: Eyes PERRLA, No xanthomas.   Normal cephalic and atramatic  Lungs:   Crackles anteriorly Heart:   HRRR and tachycardic S1 S2 Pulses are 2+ & equal.  Faint femoral pulses Abdomen: Bowel sounds are positive, abdomen soft and non-tender without masses or                  Hernia's noted. Extremities:   No clubbing, cyanosis or edema.  DP +1  Labs:   Lab Results  Component Value Date   WBC 9.0 01/15/2015   HGB 10.5* 01/15/2015   HCT 33.5* 01/15/2015   MCV 96.0 01/15/2015   PLT 380 01/15/2015    Recent Labs Lab 01/15/15 1850  NA 128*  K 4.3  CL 86*  CO2 30  BUN 15  CREATININE 0.91  CALCIUM 8.6*  PROT 5.4*  BILITOT 0.8  ALKPHOS 99  ALT 16  AST 24  GLUCOSE 253*   No results found for: PTT Lab Results  Component Value Date   INR 1.08 01/15/2015   INR 3.77* 05/20/2014   INR 1.05 03/25/2014   Lab Results  Component Value Date   TROPONINI 0.05* 01/15/2015    No results found for: CHOL No results found for: HDL No results found for: LDLCALC No results found for: TRIG No results found for: CHOLHDL No results found for: LDLDIRECT    Radiology:  Dg Chest Port 1 View  01/15/2015  CLINICAL DATA:  CVC and endotracheal tube placement EXAM: PORTABLE CHEST 1 VIEW COMPARISON:  01/15/2015 FINDINGS: Endotracheal tube is been placed with tip measuring 5.5 cm above the carina. Enteric tube tip is off the field of view but below the left hemidiaphragm. Indwelling power port type central venous catheter remains in place with tip over the cavoatrial junction. There is a new left jugular venous line present with tip projecting over  the low SVC. No pneumothorax. Small bilateral pleural effusions, greater on the right. Infiltration or atelectasis in the lung bases. Hazy densities in both lungs consistent with known mass lesions. These are less well defined than on the previous study. Normal heart size and pulmonary vascularity. IMPRESSION: Appliances appear in satisfactory location. Bilateral pleural effusions with basilar atelectasis or infiltration and pulmonary mass lesions without significant change since prior study, allowing for technical differences. Electronically Signed   By: Lucienne Capers M.D.   On: 01/15/2015 19:11   Dg Chest Portable 1 View  01/15/2015  CLINICAL DATA:  Metastatic breast cancer. Possible aspiration today. EXAM: PORTABLE CHEST 1 VIEW COMPARISON:  Chest x-ray a 07/03/2014 and CT scan of the same date. FINDINGS: The left IJ power port tip is in the right atrium. There are bilateral pulmonary lesions consistent with known metastatic disease. The largest lesion in the right upper lobe measures approximately 4.3 cm and previously measured 4.7 cm. The adjacent smaller nodule has enlarged and now measures 3 cm. There are bilateral probable loculated pleural fluid collections. No obvious overlying infiltrates or edema. IMPRESSION: Progressive pulmonary metastatic disease. Bilateral pleural effusions with loculation on the right. A pleural mass is also possible. No definite overlying infiltrates or edema. Electronically Signed   By: Marijo Sanes M.D.   On: 01/15/2015 15:59    EKG:  Atrial flutter with RVR at 170bpm  ASSESSMENT/PLAN: 1.  New onset atrial flutter of unknown duration with RVR.  Most likely triggered by acute choking and aspiration with acute respiratory distress.  She has been on anticoagulation chronically with Lovenox which has been dosed by LMWH levels by hematology at [facility] for history of PE/DVT in January 2016.  She has not missed any doses per her husband. Will check a stat level.  Will try to  treat her rapid rate with amio IV as BP tolerates since I believe her atrial arrhythmias are triggered by underlying pulmonary process and if she is cardioverted there is a high likelihood that this will reoccur.  If we cannot get her HR controlled may need to proceed with DCCV since she has been on therapeutic doses of Lovenox.   2.  Acute respiratory failure secondary to aspiration in setting of significant metastatic breast CA with pleural effusions.  No significant pulmonary edema noted on chest xray.  2D echo 06/2014 with normal LVF.  Will repeat echo once HR slowed to reassess LVF and rule out pericardial effusion given history of metastatic CA. 3.  Triple negative breast CA metastatic to lungs - followed at [facility].  Per spouse, she has been declining rapidly over the past few weeks with weakness, malaise and poor PO intake. 4.  Hypotension with shock ? Secondary to acute respiratory failure with hypoxia and aspiration, sepsis from UTI, dehydration and rapid atrial flutter.  Continue aggressive IVF resuscitation and Levophed as needed for BP support.  Also needs stress dose steroids due to chronic prednisone use. 4.  History of PE with DVT on SQ Lovenox - changed to IV Heparin gtt.  Need to consider recurrent PE in setting of acute hypoxia and hypotension. 5.  History of influenza, HCAP and postobstructive PNA 06/2014  Patient is critically ill requiring pressors for BP support.  Total time spent with patient was 90 minutes, of which , > 50% was spent in direct patient care.    Sueanne Margarita, MD  01/15/2015  9:59 PM

## 2015-01-15 NOTE — ED Notes (Signed)
Dr. Hubert Azure with Critical care notified of Lactic of 4.97

## 2015-01-15 NOTE — ED Notes (Addendum)
MD at bedside during adenosine administration. Patient tolerated well. Heart rate changed to baseline flutter. Heart rate maintaining 170 at this time. Patient remains alert and oriented with no complaints.

## 2015-01-15 NOTE — Progress Notes (Signed)
Asked to f/u on patient by Dr Radford Pax. She has just gotten to ICU floor at [facility]. SBPs in high 90s on levophed, heart rates 160s. She is just starting the initial amio bolus. Follow rates and pressures, she is asked to contact cardiology overnight if any issues. Goal is ideally to control with amio alone given her advanced illness, trying to avoid DCCV unless absolutely neccesary. Pharmacy consult placed by admitting team for hep gtt.     Zandra Abts MD

## 2015-01-15 NOTE — ED Provider Notes (Signed)
CSN: [id number]     Arrival date & time 01/15/15  1345 History   First MD Initiated Contact with Patient 01/15/15 1359     Chief Complaint  Patient presents with  . Aspiration  . Tachycardia   HPI Patient presents to the emergency room for evaluation of possible aspiration and shortness of breath. Patient has a complicated medical history that includes metastatic breast cancer. Patient is undergoing chemotherapy treatments at [facility]. Her last treatment was in the last week or 2. Patient says she was at home drinking when she patient began coughing and felt short of breath. She was unable to catch her breath and called 911. When they arrived and noted that she was diaphoretic and a heart rate was in the 180s. It was a narrow complex rhythm. They attempted vagal maneuvers without success. Patient was transported to the ED for further treatment.  Patient denies any trouble with vomiting or diarrhea. No fevers or chills. She does have a history of the ED and is on Lovenox.  Past Medical History  Diagnosis Date  . Asthma   . Breast pain in female     throbbing  . Abdominal pain   . Breast lump in female   . Cancer ([place])     rt  . PE (pulmonary embolism) 03/26/2014  . Neuropathy ([place])   . GERD (gastroesophageal reflux disease)   . History of hiatal hernia    Past Surgical History  Procedure Laterality Date  . Colonoscopy w/ polypectomy  12/11, 4/12  . Tonsillectomy    . Skin cancer excision  2011  . Bone density  2011  . Mastectomy      right july 2012  . Breast surgery      Right Mastectomy  . Lung biopsy  08/2013   History reviewed. No pertinent family history. Social History  Substance Use Topics  . Smoking status: Never Smoker   . Smokeless tobacco: Never Used  . Alcohol Use: No   OB History    No data available     Review of Systems  Constitutional: Negative for fever.  HENT: Negative for nosebleeds.   Respiratory: Negative for shortness of breath.   Cardiovascular:  Negative for chest pain.  Genitourinary: Negative for dysuria.  Hematological: Bruises/bleeds easily.      Allergies  Albuterol; Penicillins; Albuterol sulfate; Codeine; Ibuprofen; Meperidine; Other; Phenobarbital; and Adhesive  Home Medications   Prior to Admission medications   Medication Sig Start Date End Date Taking? Authorizing Provider  acetaminophen (TYLENOL) 325 MG tablet Take 2 tablets (650 mg total) by mouth every 6 (six) hours as needed for mild pain (or Fever >/= 101). 07/05/14  Yes Ripudeep K Rai, MD  beclomethasone (QVAR) 40 MCG/ACT inhaler Inhale 2 puffs into the lungs 2 (two) times daily as needed (SOB).    Yes Historical Provider, MD  Calcium Carbonate-Vitamin D (CALCIUM + D PO) Take 2 tablets by mouth daily.   Yes Historical Provider, MD  calcium elemental as carbonate (TUMS ULTRA 1000) 400 MG chewable tablet Chew 1,000-2,000 mg by mouth 3 (three) times daily as needed for heartburn.   Yes Historical Provider, MD  enoxaparin (LOVENOX) 60 MG/0.6ML injection Inject 50 mg into the skin daily.  04/24/14  Yes Historical Provider, MD  furosemide (LASIX) 20 MG tablet Take 20 mg by mouth daily. 12/05/14 12/05/15 Yes Historical Provider, MD  Glucosamine HCl-MSM 1500-500 MG/30ML LIQD Take 30 mLs by mouth daily.   Yes Historical Provider, MD  ipratropium (ATROVENT) 0.02 %  nebulizer solution Take 2.5 mLs (0.5 mg total) by nebulization every 6 (six) hours. 07/05/14  Yes Ripudeep Krystal Eaton, MD  lidocaine-prilocaine (EMLA) cream Apply 1 application topically once.    Yes Historical Provider, MD  Multiple Vitamin (MULTIVITAMIN WITH MINERALS) TABS tablet Take 1 tablet by mouth daily.   Yes Historical Provider, MD  nystatin (MYCOSTATIN) 100000 UNIT/ML suspension Take 5 mLs by mouth 4 (four) times daily - after meals and at bedtime. 01/07/15 01/17/15 Yes Historical Provider, MD  ondansetron (ZOFRAN) 4 MG tablet Take 4 mg by mouth every 8 (eight) hours as needed for nausea.  04/05/14  Yes Historical  Provider, MD  predniSONE (DELTASONE) 10 MG tablet Take 30 mg by mouth daily with breakfast.  04/28/14  Yes Historical Provider, MD  [place] Patient goes to [facility] to get chemo.  Patient goes 2 weeks on and 1 weeks off   Yes Historical Provider, MD  ranitidine (ZANTAC) 150 MG tablet Take 150 mg by mouth 2 (two) times daily.   Yes Historical Provider, MD  sulfamethoxazole-trimethoprim (BACTRIM,SEPTRA) 400-80 MG tablet Take 1 tablet by mouth daily.   Yes Historical Provider, MD  benzonatate (TESSALON) 100 MG capsule Take 1 capsule (100 mg total) by mouth 3 (three) times daily. Patient not taking: Reported on 01/15/2015 07/05/14   Ripudeep Krystal Eaton, MD  clotrimazole (LOTRIMIN) 1 % cream Apply topically 2 (two) times daily. Patient not taking: Reported on 01/15/2015 05/21/14   Belkys A Regalado, MD  guaiFENesin-dextromethorphan (ROBITUSSIN DM) 100-10 MG/5ML syrup Take 5 mLs by mouth every 4 (four) hours as needed for cough. Patient not taking: Reported on 01/15/2015 07/05/14   Ripudeep Krystal Eaton, MD  levofloxacin (LEVAQUIN) 750 MG/150ML SOLN Inject 150 mLs (750 mg total) into the vein daily. Patient not taking: Reported on 01/15/2015 07/05/14   Ripudeep Krystal Eaton, MD  menthol-cetylpyridinium (CEPACOL) 3 MG lozenge Take 1 lozenge (3 mg total) by mouth as needed for sore throat. Patient not taking: Reported on 01/15/2015 07/05/14   Ripudeep K Rai, MD  urea (CARMOL) 10 % cream Apply topically as needed. Apply on palm Patient not taking: Reported on 01/15/2015 05/21/14   Belkys A Regalado, MD  vancomycin (VANCOCIN) 1 GM/200ML SOLN Inject 200 mLs (1,000 mg total) into the vein every 12 (twelve) hours. Patient not taking: Reported on 01/15/2015 07/05/14   Ripudeep K Rai, MD   BP 162/78 mmHg  Pulse 168  Resp 27  SpO2 100% Physical Exam  Constitutional: No distress.  HENT:  Head: Normocephalic and atraumatic.  Right Ear: External ear normal.  Left Ear: External ear normal.  Eyes: Conjunctivae are  normal. Right eye exhibits no discharge. Left eye exhibits no discharge. No scleral icterus.  Neck: Neck supple. No tracheal deviation present.  Cardiovascular: Regular rhythm and intact distal pulses.  Tachycardia present.   Pulmonary/Chest: Effort normal and breath sounds normal. No stridor. No respiratory distress. She has no wheezes. She has no rales.  Abdominal: Soft. Bowel sounds are normal. She exhibits no distension. There is no tenderness. There is no rebound and no guarding.  Musculoskeletal: She exhibits edema. She exhibits no tenderness.  Neurological: She is alert. She has normal strength. No cranial nerve deficit (no facial droop, extraocular movements intact, no slurred speech) or sensory deficit. She exhibits normal muscle tone. She displays no seizure activity. Coordination normal.  Skin: Skin is warm and dry. No rash noted. There is pallor.  Bruised on the skin   Psychiatric: She has a normal mood  and affect.  Nursing note and vitals reviewed.   ED Course  .Intubation Date/Time: 01/15/2015 4:57 PM Performed by: Dorie Rank Authorized by: Dorie Rank Consent: The procedure was performed in an emergent situation. Indications: respiratory distress Intubation method: video-assisted Patient status: paralyzed (RSI) Preoxygenation: nonrebreather mask and BVM Sedatives: etomidate Paralytic: succinylcholine Laryngoscope size: Mac 4 Tube size: 7.5 mm Tube type: cuffed Number of attempts: 1 Cords visualized: yes Post-procedure assessment: chest rise and ETCO2 monitor Breath sounds: equal Cuff inflated: yes Chest x-ray interpreted by radiologist. Patient tolerance: Patient tolerated the procedure well with no immediate complications   (including critical care time) CRITICAL CARE Performed by: CZYSA,YTK Total critical care time: 35 minutes Critical care time was exclusive of separately billable procedures and treating other patients. Critical care was necessary to treat or  prevent imminent or life-threatening deterioration. Critical care was time spent personally by me on the following activities: development of treatment plan with patient and/or surrogate as well as nursing, discussions with consultants, evaluation of patient's response to treatment, examination of patient, obtaining history from patient or surrogate, ordering and performing treatments and interventions, ordering and review of laboratory studies, ordering and review of radiographic studies, pulse oximetry and re-evaluation of patient's condition.  1435  I review the patient's EKG telemetry strip. She had a very regular narrow complex tachycardia. Possible atrial flutter versus adenosine. 6 mg of adenosine was given. There was a brief pause in her rhythm.   Flutter waves were visible.  Pt returned to her narrow complex tachycardia which appears to be atrial flutter.  Will start a cardizem drip  1600  Pt has not improved on the cardizem drip.  She seems to be having increasing difficulty with her breathing.  She is vomiting more in the ED.  Now she is diaphoretic and seems to be struggling.  I spoke with the husband.  Short term intubation is fine.  No CPR.  1658 Dr. Theodosia Blender at the bedside. We will add stress dose steroids. Patient had an episode of hypotension earlier. Cardizem drip was discontinued.  Half milligram of epinephrine was given for a blood pressure in the 60s. There has been improvement now. Blood pressure greater than 100. Plan on starting amiodarone. Patient may end up requiring an epinephrine drip if her blood pressure drops again. We'll start sedation.  Critical care was consulted for admission.   Labs Review Labs Reviewed  CBC - Abnormal; Notable for the following:    RBC 3.64 (*)    Hemoglobin 11.0 (*)    HCT 35.9 (*)    RDW 20.7 (*)    Platelets 459 (*)    All other components within normal limits  COMPREHENSIVE METABOLIC PANEL - Abnormal; Notable for the following:    Sodium  129 (*)    Chloride 84 (*)    CO2 34 (*)    Glucose, Bld 227 (*)    Total Protein 6.1 (*)    Albumin 3.2 (*)    All other components within normal limits  APTT  PROTIME-INR  BLOOD GAS, ARTERIAL  I-STAT TROPOININ, ED  I-STAT CG4 LACTIC ACID, ED    Imaging Review Dg Chest Portable 1 View  01/15/2015  CLINICAL DATA:  Metastatic breast cancer. Possible aspiration today. EXAM: PORTABLE CHEST 1 VIEW COMPARISON:  Chest x-ray a 07/03/2014 and CT scan of the same date. FINDINGS: The left IJ power port tip is in the right atrium. There are bilateral pulmonary lesions consistent with known metastatic disease. The largest lesion in  the right upper lobe measures approximately 4.3 cm and previously measured 4.7 cm. The adjacent smaller nodule has enlarged and now measures 3 cm. There are bilateral probable loculated pleural fluid collections. No obvious overlying infiltrates or edema. IMPRESSION: Progressive pulmonary metastatic disease. Bilateral pleural effusions with loculation on the right. A pleural mass is also possible. No definite overlying infiltrates or edema. Electronically Signed   By: Marijo Sanes M.D.   On: 01/15/2015 15:59   I have personally reviewed and evaluated these images and lab results as part of my medical decision-making.   EKG Interpretation   Date/Time:  Tuesday January 15 2015 13:57:43 EDT Ventricular Rate:  170 PR Interval:  77 QRS Duration: 88 QT Interval:  259 QTC Calculation: 435 R Axis:   83 Text Interpretation:  Supraventricular tachycardia versus atrial flutter  Ventricular premature complex Borderline right axis deviation  Repolarization abnormality, prob rate related tachycardic rhythm is new  since prior tracing Reconfirmed by Kairi Tufo  MD-J, Rhodes Calvert ([id number]) on 01/15/2015  2:31:59 PM      MDM   Final diagnoses:  Metastatic breast cancer ([place])  Acute respiratory failure with hypoxia (HCC)  Atrial flutter with rapid ventricular response [facility])    Patient  has a complex medical history. She presented with acute dyspnea after aspiration associated with atrial flutter with a rapid and regular rate. Decompensated in the emergency department. She was intubated for respiratory distress. Cardiology was counseled regarding her persistent tachycardic rhythm. I considered cardioversion but after discussion with Dr. Radford Pax we will try amiodarone first.  Critical care, Dr. Pia Mau was consulted. Plan on admission to the ICU. According to the husband, the patient is DO NOT RESUSCITATE but agrees with short term intubation to see if she can be stabilized.    Dorie Rank, MD 01/15/15 1700

## 2015-01-15 NOTE — Progress Notes (Signed)
ANTIBIOTIC CONSULT NOTE - INITIAL  Pharmacy Consult for Vancomycin, Aztreonam, Azithromycin Indication: septic shock, presumed aspiration, UTI  Allergies  Allergen Reactions  . Albuterol Shortness Of Breath    Tachycardia, feels like she is going to die  . Penicillins Other (See Comments)    Childhood reaction Has patient had a PCN reaction causing immediate rash, facial/tongue/throat swelling, SOB or lightheadedness with hypotension: unknown Has patient had a PCN reaction causing severe rash involving mucus membranes or skin necrosis: unknown Has patient had a PCN reaction that required hospitalization unknown Has patient had a PCN reaction occurring within the last 10 years: unknown If all of the above answers are "NO", then may proceed with Cephalosporin use.   . Albuterol Sulfate Other (See Comments)    TACHYCARDIA  . Codeine Nausea Only and Other (See Comments)    woosy  . Ibuprofen Other (See Comments)    Induces patient asthma  . Meperidine Nausea Only    Other reaction(s): Other (See Comments) "wiped out" and nausea  . Other     Unknown narcotic  . Phenobarbital Other (See Comments)    Erratic behavior, hyper, jittery  . Adhesive [Tape] Rash    Patient Measurements: Height: '5\' 2"'$  (157.5 cm) Weight: 164 lb (74.39 kg) IBW/kg (Calculated) : 50.1  Vital Signs: BP: 89/62 mmHg (11/01 1720) Pulse Rate: 48 (11/01 1814) Intake/Output from previous day:   Intake/Output from this shift:    Labs:  Recent Labs  01/15/15 1424  WBC 10.4  HGB 11.0*  PLT 459*  CREATININE 0.59   Estimated Creatinine Clearance: 59.1 mL/min (by C-G formula based on Cr of 0.59). No results for input(s): VANCOTROUGH, VANCOPEAK, VANCORANDOM, GENTTROUGH, GENTPEAK, GENTRANDOM, TOBRATROUGH, TOBRAPEAK, TOBRARND, AMIKACINPEAK, AMIKACINTROU, AMIKACIN in the last 72 hours.   Microbiology: No results found for this or any previous visit (from the past 720 hour(s)).  Medical History: Past  Medical History  Diagnosis Date  . Asthma   . Breast pain in female     throbbing  . Abdominal pain   . Breast lump in female   . Cancer ([place])     rt  . PE (pulmonary embolism) 03/26/2014  . Neuropathy ([place])   . GERD (gastroesophageal reflux disease)   . History of hiatal hernia     Assessment: 65 y/oF with PMH of metastatic breast cancer undergoing chemotherapy at [facility], PE, GERD who presented to [facility] ED for evaluation of possible aspiration and SOB. Patient decompensated in ED and was intubated for respiratory failure. Patient also found to be in a-flutter in ED. Pharmacy consulted to assist with dosing of Vancomycin, Aztreonam, and Azithromycin for septic shock, presumed aspiration, and UTI.  11/1 >> Vancomycin >> 11/1 >> Aztreonam >>  11/1 >> Azithromycin >>   11/1 blood x 2: sent 11/1 urine: ordered  11/1 tracheal aspirate: ordered   Goal of Therapy:  Vancomycin trough level 15-20 mcg/ml  Appropriate antibiotic dosing for renal function and indication Eradication of infection  Plan:   Vancomycin 1g IV x 1, then '750mg'$  IV q12h  Plan for Vancomycin trough level at steady state.  Aztreonam 2g IV q8h  Azithromycin '500mg'$  IV q24h.  Monitor renal function, cultures, clinical course.   Lindell Spar, PharmD, BCPS Pager: [phone] 01/15/2015 7:31 PM

## 2015-01-15 NOTE — H&P (Addendum)
PULMONARY / CRITICAL CARE MEDICINE   Name: [name] [name] MRN: [id number] DOB: 1940-05-03    ADMISSION DATE:  01/15/2015 CONSULTATION DATE:  01/15/15  REFERRING MD :  ED  CHIEF COMPLAINT:  Aspiration, vent dependent respiratory failure, sepsis, atrial flutter.  INITIAL PRESENTATION: [name] [name] is a 74 year old with metastatic breast cancer being followed at [facility]. Patient was at home drinking orange juice when she began coughing and felt short of breath. 911 was called and she was brought to the ED and intubated for respiratory failure. It was noted that her heart reported in the 180s, atrial flutter. She did not respond to vagal maneuvers or adenosine. Started on Levophed for hypotension.  STUDIES:  CXR 11/1 >> Progressive metastatic disease, B/L pleural effusions.  SIGNIFICANT EVENTS: 11/1 Admit, intubated for respiratory failure aspiration.   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 She is being followed at [facility] and has failed multiple rounds of chemotherapy. She is currently getting palliative chemotherapy with Gemzar started on 10/04/14. She also has history of PE in January 2016. She is on Lovenox anticoagulation for that.  She is being monitored at the anticoagulation medication clinic at [facility] by [place] levels and had been therapeutic as per the last report on 12/26/14.  She was admitted in April 2016 with an influenza, HCAP, postobstructive pneumonia. She was bronched at [facility] for that but no bacterial organism identified. She is on Bactrim as an outpatient for unclear reasons. As per the husband she had been declining rapidly over the past few weeks with with weakness, loss of appetite malaise and very poor PO intake.   PAST MEDICAL HISTORY :   has a past medical history of Asthma; Breast pain in female; Abdominal pain; Breast lump in female; Cancer  [facility]); PE (pulmonary embolism) (03/26/2014); Neuropathy ([place]); GERD (gastroesophageal reflux disease); and History of hiatal hernia.  has past surgical history that includes Colonoscopy w/ polypectomy (12/11, 4/12); Tonsillectomy; Skin cancer excision (2011); Bone density (2011); Mastectomy; Breast surgery; and Lung biopsy (08/2013). Prior to Admission medications   Medication Sig Start Date End Date Taking? Authorizing Provider  acetaminophen (TYLENOL) 325 MG tablet Take 2 tablets (650 mg total) by mouth every 6 (six) hours as needed for mild pain (or Fever >/= 101). 07/05/14  Yes Ripudeep K Rai, MD  beclomethasone (QVAR) 40 MCG/ACT inhaler Inhale 2 puffs into the lungs 2 (two) times daily as needed (SOB).    Yes Historical Provider, MD  Calcium Carbonate-Vitamin D (CALCIUM + D PO) Take 2 tablets by mouth daily.   Yes Historical Provider, MD  calcium elemental as carbonate (TUMS ULTRA 1000) 400 MG chewable tablet Chew 1,000-2,000 mg by mouth 3 (three) times daily as needed for heartburn.   Yes Historical Provider, MD  enoxaparin (LOVENOX) 60 MG/0.6ML injection Inject 50 mg into the skin daily.  04/24/14  Yes Historical Provider, MD  furosemide (LASIX) 20 MG tablet Take 20 mg by mouth daily. 12/05/14 12/05/15 Yes Historical Provider, MD  Glucosamine HCl-MSM 1500-500 MG/30ML LIQD Take 30 mLs by mouth daily.   Yes Historical Provider, MD  ipratropium (ATROVENT) 0.02 % nebulizer solution Take 2.5 mLs (0.5 mg total) by nebulization every 6 (six) hours. 07/05/14  Yes Ripudeep Krystal Eaton, MD  lidocaine-prilocaine (EMLA) cream Apply 1 application topically once.    Yes Historical Provider, MD  Multiple Vitamin (MULTIVITAMIN WITH MINERALS) TABS tablet Take 1 tablet by mouth daily.  Yes Historical Provider, MD  nystatin (MYCOSTATIN) 100000 UNIT/ML suspension Take 5 mLs by mouth 4 (four) times daily - after meals and at bedtime. 01/07/15 01/17/15 Yes Historical Provider, MD  ondansetron (ZOFRAN) 4 MG tablet Take 4 mg by  mouth every 8 (eight) hours as needed for nausea.  04/05/14  Yes Historical Provider, MD  predniSONE (DELTASONE) 10 MG tablet Take 30 mg by mouth daily with breakfast.  04/28/14  Yes Historical Provider, MD  [place] Patient goes to [facility] to get chemo.  Patient goes 2 weeks on and 1 weeks off   Yes Historical Provider, MD  ranitidine (ZANTAC) 150 MG tablet Take 150 mg by mouth 2 (two) times daily.   Yes Historical Provider, MD  sulfamethoxazole-trimethoprim (BACTRIM,SEPTRA) 400-80 MG tablet Take 1 tablet by mouth daily.   Yes Historical Provider, MD  benzonatate (TESSALON) 100 MG capsule Take 1 capsule (100 mg total) by mouth 3 (three) times daily. Patient not taking: Reported on 01/15/2015 07/05/14   Ripudeep Krystal Eaton, MD  clotrimazole (LOTRIMIN) 1 % cream Apply topically 2 (two) times daily. Patient not taking: Reported on 01/15/2015 05/21/14   Belkys A Regalado, MD  guaiFENesin-dextromethorphan (ROBITUSSIN DM) 100-10 MG/5ML syrup Take 5 mLs by mouth every 4 (four) hours as needed for cough. Patient not taking: Reported on 01/15/2015 07/05/14   Ripudeep Krystal Eaton, MD  levofloxacin (LEVAQUIN) 750 MG/150ML SOLN Inject 150 mLs (750 mg total) into the vein daily. Patient not taking: Reported on 01/15/2015 07/05/14   Ripudeep Krystal Eaton, MD  menthol-cetylpyridinium (CEPACOL) 3 MG lozenge Take 1 lozenge (3 mg total) by mouth as needed for sore throat. Patient not taking: Reported on 01/15/2015 07/05/14   Ripudeep K Rai, MD  urea (CARMOL) 10 % cream Apply topically as needed. Apply on palm Patient not taking: Reported on 01/15/2015 05/21/14   Belkys A Regalado, MD  vancomycin (VANCOCIN) 1 GM/200ML SOLN Inject 200 mLs (1,000 mg total) into the vein every 12 (twelve) hours. Patient not taking: Reported on 01/15/2015 07/05/14   Ripudeep Krystal Eaton, MD   Allergies  Allergen Reactions  . Albuterol Shortness Of Breath    Tachycardia, feels like she is going to die  . Penicillins Other (See Comments)     Childhood reaction Has patient had a PCN reaction causing immediate rash, facial/tongue/throat swelling, SOB or lightheadedness with hypotension: unknown Has patient had a PCN reaction causing severe rash involving mucus membranes or skin necrosis: unknown Has patient had a PCN reaction that required hospitalization unknown Has patient had a PCN reaction occurring within the last 10 years: unknown If all of the above answers are "NO", then may proceed with Cephalosporin use.   . Albuterol Sulfate Other (See Comments)    TACHYCARDIA  . Codeine Nausea Only and Other (See Comments)    woosy  . Ibuprofen Other (See Comments)    Induces patient asthma  . Meperidine Nausea Only    Other reaction(s): Other (See Comments) "wiped out" and nausea  . Other     Unknown narcotic  . Phenobarbital Other (See Comments)    Erratic behavior, hyper, jittery  . Adhesive [Tape] Rash    FAMILY HISTORY:  indicated that her mother is deceased. She indicated that her father is deceased.  SOCIAL HISTORY:  reports that she has never smoked. She has never used smokeless tobacco. She reports that she does not drink alcohol or use illicit drugs.  REVIEW OF SYSTEMS:   Obtained from the husband. Patient intubated. Denies  any fevers, chills, cough, sputum production. She has dyspnea on exertion at baseline. Reduced by mouth intake, weakness, malaise,, loss of appetite Denies any nausea, vomiting, diarrhea, constipation. Denies any chest pain, palpitations.  All other review of systems are negative.    SUBJECTIVE:   VITAL SIGNS: Pulse Rate:  [168-172] 168 (11/01 1545) Resp:  [18-31] 27 (11/01 1545) BP: (116-162)/(55-85) 162/78 mmHg (11/01 1545) SpO2:  [100 %] 100 % (11/01 1545) FiO2 (%):  [100 %] 100 % (11/01 1645) HEMODYNAMICS:   VENTILATOR SETTINGS: Vent Mode:  [-] PRVC FiO2 (%):  [100 %] 100 % Set Rate:  [20 bmp] 20 bmp Vt Set:  [450 mL] 450 mL PEEP:  [5 cmH20] 5 cmH20 Plateau Pressure:  [47  cmH20] 47 cmH20 INTAKE / OUTPUT: No intake or output data in the 24 hours ending 01/15/15 1758  PHYSICAL EXAMINATION: General:  Awake,  appears to be in moderate distress, alert oriented.  Neuro:  Moves all 4 extremities, cranial nerves intact, no gross focal deficit. ENT:  ET tube in place, dry mucous membranes Cardiovascular:  Tachycardia, regular rate, no murmurs rubs or gallops. Lungs:  Clear to auscultation anteriorly. Abdomen:  Soft, distended, nontender, positive bowel sounds Skin:  Intact  LABS:  CBC  Recent Labs Lab 01/15/15 1424  WBC 10.4  HGB 11.0*  HCT 35.9*  PLT 459*   Coag's  Recent Labs Lab 01/15/15 1424  APTT 35  INR 1.08   BMET  Recent Labs Lab 01/15/15 1424  NA 129*  K 4.1  CL 84*  CO2 34*  BUN 15  CREATININE 0.59  GLUCOSE 227*   Electrolytes  Recent Labs Lab 01/15/15 1424  CALCIUM 8.9   Sepsis Markers No results for input(s): LATICACIDVEN, PROCALCITON, O2SATVEN in the last 168 hours. ABG No results for input(s): PHART, PCO2ART, PO2ART in the last 168 hours. Liver Enzymes  Recent Labs Lab 01/15/15 1424  AST 20  ALT 16  ALKPHOS 107  BILITOT 0.4  ALBUMIN 3.2*   Cardiac Enzymes No results for input(s): TROPONINI, PROBNP in the last 168 hours. Glucose No results for input(s): GLUCAP in the last 168 hours.  Imaging Dg Chest Portable 1 View  01/15/2015  CLINICAL DATA:  Metastatic breast cancer. Possible aspiration today. EXAM: PORTABLE CHEST 1 VIEW COMPARISON:  Chest x-ray a 07/03/2014 and CT scan of the same date. FINDINGS: The left IJ power port tip is in the right atrium. There are bilateral pulmonary lesions consistent with known metastatic disease. The largest lesion in the right upper lobe measures approximately 4.3 cm and previously measured 4.7 cm. The adjacent smaller nodule has enlarged and now measures 3 cm. There are bilateral probable loculated pleural fluid collections. No obvious overlying infiltrates or edema.  IMPRESSION: Progressive pulmonary metastatic disease. Bilateral pleural effusions with loculation on the right. A pleural mass is also possible. No definite overlying infiltrates or edema. Electronically Signed   By: Marijo Sanes M.D.   On: 01/15/2015 15:59    ASSESSMENT / PLAN: PULMONARY OETT 11/1>> A: Vent dependent respiratory failure Aspiration Pulmonary embolus 1/16 P:   Continue full vent support for now. Check ABG Chest x-ray for placement of ET tube. Consider CT PE protocol when stable.  Heparin drip for anticoagulation.   CARDIOVASCULAR CVL 11/1>> A:  Hypotension requiring pressors. Atrial flutter. P:  Continue Norepi for pressure support.  Stress dose steroids as she is on chronic prednisone as an outpatient. Insert central line and A-line. She appears dehydrated and will require further IV  fluid resuscitation Goals CVP 10-12. Check lactic acid, Scvo2 Starting Amiodarone for a flutter. Cardiology consulted. Follow troponin and EKG Echocardiogram  RENAL A: Stable P:   Monitor urine output  GASTROINTESTINAL A:  Stable P:   Keep nothing by mouth.  HEMATOLOGIC/ONCOLOGY A:  Metastatic breast cancer. P:  Update [facility].  INFECTIOUS A:   Septic shock Presumed aspiration, UTI.  P:   BCx2 11/1 >> UC 11/1 >> Sputum  Abx:  Vancomycin, start date 11/1, day  Aztreonam, start date 11/1, day  Azithromycin, start date 11/1, day   ENDOCRINE A:   Adrenal insufficiency, on chronic steroids P:   Start stress dose steroids. Check TSH.  NEUROLOGIC A:   P:   RASS goal: -1 Start fentanyl drip for sedation. Versed PRN   FAMILY  - Updates: Husband updated at bedside. Patient was awake and participated in the conversation. She had been declining over the past few weeks. They both agreed that we would try to be aggressive and treat any temporary condition such as aspiration, sepsis for a few days. If it appears that she is not turning around then  they would not wish to prolong her suffering and would consider making her comfort. I also spoke with her oncologist at [facility] [phone]) who would like to be updated about any major change in her status.  - Inter-disciplinary family meet or Palliative Care meeting due by: 11/8  Critical care time- 45 mins.  TODAY'S SUMMARY:   Marshell Garfinkel MD [facility] Pager [phone] If no answer or after 3pm call: [phone] 01/15/2015, 5:58 PM

## 2015-01-15 NOTE — Procedures (Signed)
Central Venous Catheter Insertion Procedure Note AALYIAH CAMBEROS [id number] [birth year]  Procedure: Insertion of Central Venous Catheter Indications: Assessment of intravascular volume, Drug and/or fluid administration and Frequent blood sampling  Procedure Details Consent: Unable to obtain consent because of emergent medical necessity. Time Out: Verified patient identification, verified procedure, site/side was marked, verified correct patient position, special equipment/implants available, medications/allergies/relevent history reviewed, required imaging and test results available.  Performed  Maximum sterile technique was used including antiseptics, cap, gloves, gown, hand hygiene, mask and sheet. Skin prep: Chlorhexidine; local anesthetic administered A antimicrobial bonded/coated triple lumen catheter was placed in the left internal jugular vein using the Seldinger technique. Ultrasound guidance used.Yes.   Catheter placed to 20 cm. Blood aspirated via all 3 ports and then flushed x 3. Line sutured x 2 and dressing applied.  Evaluation Blood flow good Complications: No apparent complications Patient did tolerate procedure well. Chest X-ray ordered to verify placement.  CXR: pending.  Georgann Housekeeper, AGACNP-BC [facility] Pulmonology/Critical Care Pager [phone] or [phone]  01/15/2015 6:39 PM

## 2015-01-15 NOTE — Procedures (Signed)
Arterial Catheter Insertion Procedure Note KEAIRRA BARDON [id number] 01/23/1941  Procedure: Insertion of Arterial Catheter  Indications: Blood pressure monitoring  Procedure Details Consent: Risks of procedure as well as the alternatives and risks of each were explained to the (patient/caregiver).  Consent for procedure obtained. Time Out: Verified patient identification, verified procedure, site/side was marked, verified correct patient position, special equipment/implants available, medications/allergies/relevent history reviewed, required imaging and test results available.  Performed  Maximum sterile technique was used including antiseptics, cap, gloves, gown, hand hygiene, mask and sheet. Skin prep: Chlorhexidine; local anesthetic administered 20 gauge catheter was inserted into right femoral artery using the Seldinger technique.  Evaluation Blood flow good; BP tracing good. Complications: No apparent complications.  Georgann Housekeeper, AGACNP-BC [facility] Pulmonology/Critical Care Pager [phone] or [phone]  01/15/2015 6:40 PM

## 2015-01-15 NOTE — Progress Notes (Signed)
ANTICOAGULATION CONSULT NOTE - Initial Consult  Pharmacy Consult for Heparin Indication: History of PE, on therapeutic lovenox PTA; atrial flutter  Allergies  Allergen Reactions  . Albuterol Shortness Of Breath    Tachycardia, feels like she is going to die  . Penicillins Other (See Comments)    Childhood reaction Has patient had a PCN reaction causing immediate rash, facial/tongue/throat swelling, SOB or lightheadedness with hypotension: unknown Has patient had a PCN reaction causing severe rash involving mucus membranes or skin necrosis: unknown Has patient had a PCN reaction that required hospitalization unknown Has patient had a PCN reaction occurring within the last 10 years: unknown If all of the above answers are "NO", then may proceed with Cephalosporin use.   . Albuterol Sulfate Other (See Comments)    TACHYCARDIA  . Codeine Nausea Only and Other (See Comments)    woosy  . Ibuprofen Other (See Comments)    Induces patient asthma  . Meperidine Nausea Only    Other reaction(s): Other (See Comments) "wiped out" and nausea  . Other     Unknown narcotic  . Phenobarbital Other (See Comments)    Erratic behavior, hyper, jittery  . Adhesive [Tape] Rash    Patient Measurements: Height: '5\' 2"'$  (157.5 cm) Weight: 164 lb (74.39 kg) IBW/kg (Calculated) : 50.1 Heparin Dosing Weight: 66.2 kg  Vital Signs: BP: 89/62 mmHg (11/01 1720) Pulse Rate: 48 (11/01 1814)  Labs:  Recent Labs  01/15/15 1424 01/15/15 1850  HGB 11.0*  --   HCT 35.9*  --   PLT 459*  --   APTT 35  --   LABPROT 14.2  --   INR 1.08  --   CREATININE 0.59 0.91  TROPONINI  --  0.05*    Estimated Creatinine Clearance: 52 mL/min (by C-G formula based on Cr of 0.91).   Medical History: Past Medical History  Diagnosis Date  . Asthma   . Breast pain in female     throbbing  . Abdominal pain   . Breast lump in female   . Cancer ([place])     rt  . PE (pulmonary embolism) 03/26/2014  . Neuropathy  ([place])   . GERD (gastroesophageal reflux disease)   . History of hiatal hernia     Assessment: 38 y/oF with PMH of metastatic breast cancer undergoing chemotherapy at [facility], PE on chronic lovenox anticoagulation, GERD who presented to [facility] ED for evaluation of possible aspiration and SOB. Patient decompensated in ED and was intubated for respiratory failure. Patient also found to be in a-flutter in ED. Lovenox held on admission, and pharmacy consulted to assist with dosing of heparin infusion. Noted PTA dose of Lovenox was 50 mg SQ daily (dose was adjusted at [facility] per LMWH levels) with last dose reported as 10/31 at 2100.  Today, 01/15/2015:  CBC: Hgb 11, Pltc elevated at 459  PT/INR: 14.2/1.08  APTT 35  LMWH level ordered per MD-pending  Goal of Therapy:  Heparin level 0.3-0.7 units/ml Monitor platelets by anticoagulation protocol: Yes   Plan:   No bolus due to patient being on therapeutic anticoagulation PTA.  Will start heparin infusion now at 1100 units/hr (~ 24 hours after last lovenox dose)  Check 8 hour heparin level.  Daily heparin level and CBC while on heparin infusion.  Monitor closely for s/s of bleeding.    Lindell Spar, PharmD, BCPS Pager: [phone] 01/15/2015 9:08 PM

## 2015-01-15 NOTE — ED Notes (Addendum)
Narrative note for nursing care for last 3 hours of care.  Pt began vomiting around 4pm. IV zofran given but pt noted to having more difficulty breathing.  Notified MD.  Pt intubatedat 1627  for decreased sats and labored breathing.   Color change noted post intubation. Lungs equal bilateral.  Placement confirmed via cxr.  OG tube placed and connected to low intermittent suction. Pt started on levophed and med  increased at intervals to maintain bp.  Pt given intermittent versed and fentanyl for sedation.  Femoral Art line placed in right femoral artery. Central line placed to left jugular.  CXR performed for confirmation of central line placement. Report called to ICU.  Pt pending meds to be given once central line cleared for use.

## 2015-01-16 ENCOUNTER — Inpatient Hospital Stay (HOSPITAL_COMMUNITY): Payer: Medicare Other

## 2015-01-16 DIAGNOSIS — I4892 Unspecified atrial flutter: Secondary | ICD-10-CM

## 2015-01-16 LAB — BLOOD GAS, ARTERIAL
Acid-Base Excess: 1.2 mmol/L (ref 0.0–2.0)
Bicarbonate: 25.4 mEq/L — ABNORMAL HIGH (ref 20.0–24.0)
DRAWN BY: 441351
FIO2: 0.4
LHR: 20 {breaths}/min
MECHVT: 450 mL
O2 Saturation: 99.2 %
PATIENT TEMPERATURE: 37
PCO2 ART: 41 mmHg (ref 35.0–45.0)
PEEP: 5 cmH2O
PO2 ART: 154 mmHg — AB (ref 80.0–100.0)
TCO2: 23.5 mmol/L (ref 0–100)
pH, Arterial: 7.41 (ref 7.350–7.450)

## 2015-01-16 LAB — GLUCOSE, CAPILLARY
Glucose-Capillary: 147 mg/dL — ABNORMAL HIGH (ref 65–99)
Glucose-Capillary: 146 mg/dL — ABNORMAL HIGH (ref 65–99)

## 2015-01-16 LAB — BASIC METABOLIC PANEL
Anion gap: 12 (ref 5–15)
BUN: 17 mg/dL (ref 6–20)
CO2: 25 mmol/L (ref 22–32)
CREATININE: 1.17 mg/dL — AB (ref 0.44–1.00)
Calcium: 7.8 mg/dL — ABNORMAL LOW (ref 8.9–10.3)
Chloride: 85 mmol/L — ABNORMAL LOW (ref 101–111)
GFR calc Af Amer: 52 mL/min — ABNORMAL LOW (ref >60)
GFR calc non Af Amer: 45 mL/min — ABNORMAL LOW (ref >60)
GLUCOSE: 216 mg/dL — AB (ref 65–99)
Potassium: 4.4 mmol/L (ref 3.5–5.1)
SODIUM: 122 mmol/L — AB (ref 135–145)

## 2015-01-16 LAB — URINALYSIS, ROUTINE W REFLEX MICROSCOPIC
BILIRUBIN URINE: NEGATIVE
Glucose, UA: NEGATIVE mg/dL
Ketones, ur: NEGATIVE mg/dL
NITRITE: NEGATIVE
PROTEIN: NEGATIVE mg/dL
SPECIFIC GRAVITY, URINE: 1.021 (ref 1.005–1.030)
UROBILINOGEN UA: 0.2 mg/dL (ref 0.0–1.0)
pH: 5 (ref 5.0–8.0)

## 2015-01-16 LAB — URINE MICROSCOPIC-ADD ON

## 2015-01-16 LAB — STREP PNEUMONIAE URINARY ANTIGEN: STREP PNEUMO URINARY ANTIGEN: NEGATIVE

## 2015-01-16 LAB — CBC
HEMATOCRIT: 31.1 % — AB (ref 36.0–46.0)
HEMOGLOBIN: 10.1 g/dL — AB (ref 12.0–15.0)
MCH: 30.9 pg (ref 26.0–34.0)
MCHC: 32.5 g/dL (ref 30.0–36.0)
MCV: 95.1 fL (ref 78.0–100.0)
Platelets: 387 10*3/uL (ref 150–400)
RBC: 3.27 MIL/uL — ABNORMAL LOW (ref 3.87–5.11)
RDW: 20.2 % — AB (ref 11.5–15.5)
WBC: 12.7 10*3/uL — ABNORMAL HIGH (ref 4.0–10.5)

## 2015-01-16 LAB — TROPONIN I
TROPONIN I: 0.06 ng/mL — AB (ref <0.031)
TROPONIN I: 0.07 ng/mL — AB (ref <0.031)

## 2015-01-16 LAB — OSMOLALITY, URINE: OSMOLALITY UR: 334 mosm/kg — AB (ref 390–1090)

## 2015-01-16 LAB — MAGNESIUM: Magnesium: 1.4 mg/dL — ABNORMAL LOW (ref 1.7–2.4)

## 2015-01-16 LAB — BRAIN NATRIURETIC PEPTIDE: B Natriuretic Peptide: 233.3 pg/mL — ABNORMAL HIGH (ref 0.0–100.0)

## 2015-01-16 LAB — HEPARIN LEVEL (UNFRACTIONATED)
HEPARIN UNFRACTIONATED: 0.79 [IU]/mL — AB (ref 0.30–0.70)
Heparin Unfractionated: 0.99 IU/mL — ABNORMAL HIGH (ref 0.30–0.70)

## 2015-01-16 LAB — PHOSPHORUS: Phosphorus: 3.8 mg/dL (ref 2.5–4.6)

## 2015-01-16 MED ORDER — INSULIN ASPART 100 UNIT/ML ~~LOC~~ SOLN
2.0000 [IU] | SUBCUTANEOUS | Status: DC
  Administered 2015-01-17: 2 [IU] via SUBCUTANEOUS
  Administered 2015-01-17 (×4): 4 [IU] via SUBCUTANEOUS
  Administered 2015-01-18 (×2): 2 [IU] via SUBCUTANEOUS
  Administered 2015-01-18: 4 [IU] via SUBCUTANEOUS
  Administered 2015-01-19 (×4): 2 [IU] via SUBCUTANEOUS
  Administered 2015-01-19 – 2015-01-20 (×4): 4 [IU] via SUBCUTANEOUS
  Administered 2015-01-20: 2 [IU] via SUBCUTANEOUS
  Administered 2015-01-20 – 2015-01-21 (×2): 4 [IU] via SUBCUTANEOUS
  Administered 2015-01-21: 2 [IU] via SUBCUTANEOUS
  Administered 2015-01-21 (×2): 4 [IU] via SUBCUTANEOUS
  Administered 2015-01-21: 6 [IU] via SUBCUTANEOUS
  Administered 2015-01-22: 4 [IU] via SUBCUTANEOUS
  Administered 2015-01-23 – 2015-01-24 (×5): 2 [IU] via SUBCUTANEOUS
  Administered 2015-01-24: 4 [IU] via SUBCUTANEOUS
  Administered 2015-01-24: 2 [IU] via SUBCUTANEOUS
  Administered 2015-01-24 (×2): 4 [IU] via SUBCUTANEOUS
  Administered 2015-01-24 – 2015-01-25 (×3): 2 [IU] via SUBCUTANEOUS
  Administered 2015-01-25: 4 [IU] via SUBCUTANEOUS
  Administered 2015-01-26: 2 [IU] via SUBCUTANEOUS
  Administered 2015-01-26: 4 [IU] via SUBCUTANEOUS
  Administered 2015-01-26 – 2015-01-27 (×3): 2 [IU] via SUBCUTANEOUS

## 2015-01-16 MED ORDER — ANTISEPTIC ORAL RINSE SOLUTION (CORINZ)
7.0000 mL | Freq: Four times a day (QID) | OROMUCOSAL | Status: DC
  Administered 2015-01-16 – 2015-01-29 (×42): 7 mL via OROMUCOSAL

## 2015-01-16 MED ORDER — HEPARIN (PORCINE) IN NACL 100-0.45 UNIT/ML-% IJ SOLN
800.0000 [IU]/h | INTRAMUSCULAR | Status: DC
  Administered 2015-01-16 – 2015-01-18 (×2): 800 [IU]/h via INTRAVENOUS
  Filled 2015-01-16 (×3): qty 250

## 2015-01-16 MED ORDER — SODIUM CHLORIDE 0.9 % IV SOLN
50.0000 ug/h | INTRAVENOUS | Status: DC
  Administered 2015-01-16: 50 ug/h via INTRAVENOUS
  Filled 2015-01-16 (×2): qty 50

## 2015-01-16 MED ORDER — CHLORHEXIDINE GLUCONATE 0.12 % MT SOLN
OROMUCOSAL | Status: AC
  Filled 2015-01-16: qty 15

## 2015-01-16 MED ORDER — MAGIC MOUTHWASH: 5.0000 mL | Freq: Four times a day (QID) | ORAL | Status: DC | PRN

## 2015-01-16 MED ORDER — CHLORHEXIDINE GLUCONATE 0.12% ORAL RINSE (MEDLINE KIT)
15.0000 mL | Freq: Two times a day (BID) | OROMUCOSAL | Status: DC
  Administered 2015-01-16 – 2015-01-29 (×21): 15 mL via OROMUCOSAL

## 2015-01-16 MED ORDER — VITAL HIGH PROTEIN PO LIQD
1000.0000 mL | ORAL | Status: DC
  Administered 2015-01-16: 1000 mL
  Filled 2015-01-16 (×2): qty 1000

## 2015-01-16 MED ORDER — VITAL HIGH PROTEIN PO LIQD
1000.0000 mL | ORAL | Status: DC
  Filled 2015-01-16: qty 1000

## 2015-01-16 MED ORDER — VITAL HIGH PROTEIN PO LIQD: 1000.0000 mL | ORAL | Status: DC

## 2015-01-16 MED ORDER — NYSTATIN 100000 UNIT/ML MT SUSP
5.0000 mL | Freq: Four times a day (QID) | OROMUCOSAL | Status: DC
  Administered 2015-01-20 – 2015-01-27 (×19): 500000 [IU] via ORAL
  Filled 2015-01-16 (×27): qty 5

## 2015-01-16 NOTE — Progress Notes (Signed)
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 She has failed multiple rounds of chemotherapy and is currently getting palliative chemo. Also with h/o PE on chronic anticoagulation with Lovenox. H/o influenza, HCAP and postobstructive PNA 06/2014.  Admitted to [facility] 01/15/15 for acute respiratory failure in the setting of aspiration, sepsis and new atrial flutter w/ RVR.  Subjective: Intubated. Family by bedside.   Objective: Vital signs in last 24 hours: Temp:  [99 F (37.2 C)-100.9 F (38.3 C)] 99.3 F (37.4 C) (11/02 0700) Pulse Rate:  [32-172] 79 (11/02 0700) Resp:  [0-33] 20 (11/02 0700) BP: (63-162)/(38-87) 104/68 mmHg (11/02 0700) SpO2:  [68 %-100 %] 100 % (11/02 0700) Arterial Line BP: (66-112)/(51-96) 109/96 mmHg (11/01 2000) FiO2 (%):  [40 %-100 %] 40 % (11/02 0038) Weight:  [162 lb 7.7 oz (73.7 kg)-164 lb (74.39 kg)] 162 lb 7.7 oz (73.7 kg) (11/01 2000) Last BM Date:  (PTA)  Intake/Output from previous day: 11/01 0701 - 11/02 0700 In: 3859.2 [I.V.:3309.2; IV Piggyback:550] Out: 125 [Urine:125] Intake/Output this shift:    Medications Current Facility-Administered Medications  Medication Dose Route Frequency Provider Last Rate Last Dose  . 0.9 %  sodium chloride infusion  1,000 mL Intravenous Continuous Dorie Rank, MD 20 mL/hr at 01/15/15 1428 1,000 mL at 01/15/15 1428  . 0.9 %  sodium chloride infusion  250 mL Intravenous PRN Praveen Mannam, MD      . 0.9 %  sodium chloride infusion   Intravenous Continuous Praveen Mannam, MD 125 mL/hr at 01/16/15 0602 1,000 mL at 01/16/15 0602  . acetaminophen (TYLENOL) tablet 1,000 mg  1,000 mg Per Tube Q6H PRN Colbert Coyer, MD   1,000 mg at 01/16/15 0000  . amiodarone (NEXTERONE PREMIX) 360 MG/200ML (1.8 mg/mL) IV infusion  30 mg/hr Intravenous Continuous Dorie Rank, MD 16.7 mL/hr at 01/16/15 0308 30 mg/hr at 01/16/15  0308  . aspirin chewable tablet 324 mg  324 mg Oral NOW Praveen Mannam, MD       Or  . aspirin suppository 300 mg  300 mg Rectal NOW Praveen Mannam, MD      . azithromycin (ZITHROMAX) 500 mg in dextrose 5 % 250 mL IVPB  500 mg Intravenous Q24H Praveen Mannam, MD   500 mg at 01/15/15 2133  . aztreonam (AZACTAM) 2 g in dextrose 5 % 50 mL IVPB  2 g Intravenous Q8H Praveen Mannam, MD   2 g at 01/16/15 0618  . fentaNYL (SUBLIMAZE) injection 100 mcg  100 mcg Intravenous Q2H PRN Dorie Rank, MD   100 mcg at 01/16/15 0657  . heparin ADULT infusion 100 units/mL (25000 units/250 mL)  1,100 Units/hr Intravenous Continuous Praveen Mannam, MD 11 mL/hr at 01/15/15 2232 1,100 Units/hr at 01/15/15 2232  . hydrocortisone sodium succinate (SOLU-CORTEF) 100 MG injection 100 mg  100 mg Intravenous 3 times per day Marshell Garfinkel, MD   100 mg at 01/16/15 0603  . midazolam (VERSED) injection 1 mg  1 mg Intravenous Q15 min PRN Dorie Rank, MD   1 mg at 01/15/15 1800  . midazolam (VERSED) injection 1 mg  1 mg Intravenous Q2H PRN Dorie Rank, MD   1 mg at 01/16/15 0442  . norepinephrine (LEVOPHED) 4 mg in dextrose 5 % 250 mL (0.016 mg/mL) infusion  2-50 mcg/min Intravenous Continuous Marshell Garfinkel, MD   Stopped at 01/16/15 0700  . pantoprazole (PROTONIX) injection 40 mg  40 mg Intravenous Q24H Praveen Mannam,  MD   40 mg at 01/15/15 2132  . phenylephrine (NEO-SYNEPHRINE) 40 mg in dextrose 5 % 250 mL (0.16 mg/mL) infusion  30-200 mcg/min Intravenous Continuous Praveen Mannam, MD 75 mL/hr at 01/16/15 0351 200 mcg/min at 01/16/15 0351  . sodium chloride 0.9 % bolus 1,000 mL  1,000 mL Intravenous Once Praveen Mannam, MD      . vancomycin (VANCOCIN) IVPB 750 mg/150 ml premix  750 mg Intravenous Q12H Praveen Mannam, MD        PE: General appearance: alert, cooperative, no distress and intubated Neck: no carotid bruit and no JVD Lungs: course BS/ diffuse crackles Heart: regular rate and rhythm Extremities: trace LEE Pulses: 2+ and  symmetric Skin: warm and dry Neurologic: Grossly normal  Lab Results:   Recent Labs  01/15/15 1424 01/15/15 2115 01/16/15 0645  WBC 10.4 9.0 12.7*  HGB 11.0* 10.5* 10.1*  HCT 35.9* 33.5* 31.1*  PLT 459* 380 387   BMET  Recent Labs  01/15/15 1424 01/15/15 1850 01/16/15 0645  NA 129* 128* 122*  K 4.1 4.3 4.4  CL 84* 86* 85*  CO2 34* 30 25  GLUCOSE 227* 253* 216*  BUN $Re'15 15 17  'Rag$ CREATININE 0.59 0.91 1.17*  CALCIUM 8.9 8.6* 7.8*   PT/INR  Recent Labs  01/15/15 1424 01/15/15 2115  LABPROT 14.2 14.6  INR 1.08 1.12   Cardiac Panel (last 3 results)  Recent Labs  01/15/15 1850 01/16/15 0027 01/16/15 0645  TROPONINI 0.05* 0.07* 0.06*    Studies/Results: 2D echo - pending.   Assessment/Plan  Active Problems:   Shock ([place])   Respiratory failure (HCC)   Atrial flutter with rapid ventricular response (HCC)   Acute respiratory failure with hypoxia ([place])  1. New onset atrial flutter of unknown duration with RVR: In the setting of underlying pulmonary process. TSH normal. 2D echo pending.  Successful conversion back to NSR on IV amiodarone this am. Maintaining NSR. HR controlled in the 80s. Currently on IV heparin. Was on chronic Lovenox for h/o PE.   2. Acute respiratory failure secondary to aspiration in setting of significant metastatic breast CA with pleural effusions: Remains intubated. No significant pulmonary edema noted on chest xray at time of admit. 2D echo 06/2014 with normal LVF. Will obtain repeat echo today, now that she is in NSR, to reassess LVF and rule out pericardial effusion given history of metastatic CA.  3. 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 Per spouse, she has been declining rapidly over the past few weeks with weakness, malaise and poor PO intake.  4. Hypotension with shock: ? Secondary to acute respiratory failure with hypoxia and aspiration, sepsis from UTI, dehydration and rapid atrial flutter.  Continue aggressive IVF resuscitation and Levophed as needed for BP support. Also needs stress dose steroids due to chronic prednisone use.  5. History of PE with DVT on SQ Lovenox: changed to IV Heparin gtt. Need to consider recurrent PE in setting of acute hypoxia and hypotension. D-dimer is abnormal at 1.20. Consider CT of chest to r/o recurrent PE.   5. History of influenza, HCAP and postobstructive PNA 06/2014     LOS: 1 day    Kaiya Boatman M. Ladoris Gene 01/16/2015 7:37 AM

## 2015-01-16 NOTE — Progress Notes (Signed)
[place] for Heparin Indication: History of PE on therapeutic lovenox PTA; atrial flutter  Allergies  Allergen Reactions  . Albuterol Shortness Of Breath    Tachycardia, feels like she is going to die  . Penicillins Other (See Comments)    Childhood reaction Has patient had a PCN reaction causing immediate rash, facial/tongue/throat swelling, SOB or lightheadedness with hypotension: unknown Has patient had a PCN reaction causing severe rash involving mucus membranes or skin necrosis: unknown Has patient had a PCN reaction that required hospitalization unknown Has patient had a PCN reaction occurring within the last 10 years: unknown If all of the above answers are "NO", then may proceed with Cephalosporin use.   . Albuterol Sulfate Other (See Comments)    TACHYCARDIA  . Codeine Nausea Only and Other (See Comments)    woosy  . Ibuprofen Other (See Comments)    Induces patient asthma  . Meperidine Nausea Only    Other reaction(s): Other (See Comments) "wiped out" and nausea  . Other     Unknown narcotic  . Phenobarbital Other (See Comments)    Erratic behavior, hyper, jittery  . Adhesive [Tape] Rash    Patient Measurements: Height: '5\' 2"'$  (157.5 cm) Weight: 162 lb 7.7 oz (73.7 kg) IBW/kg (Calculated) : 50.1 HEPARIN DW (KG): 65.9   Vital Signs: Temp: 99.3 F (37.4 C) (11/02 0700) Temp Source: Core (Comment) (11/02 0400) BP: 104/68 mmHg (11/02 0700) Pulse Rate: 79 (11/02 0700)  Labs:  Recent Labs  01/15/15 1424 01/15/15 1850 01/15/15 2115 01/16/15 0027 01/16/15 0645  HGB 11.0*  --  10.5*  --  10.1*  HCT 35.9*  --  33.5*  --  31.1*  PLT 459*  --  380  --  387  APTT 35  --  26  --   --   LABPROT 14.2  --  14.6  --   --   INR 1.08  --  1.12  --   --   HEPARINUNFRC  --   --   --   --  0.99*  CREATININE 0.59 0.91  --   --  1.17*  TROPONINI  --  0.05*  --  0.07* 0.06*    Estimated Creatinine Clearance: 40.2 mL/min (by C-G  formula based on Cr of 1.17).   Medical History: Past Medical History  Diagnosis Date  . Asthma   . Breast pain in female     throbbing  . Abdominal pain   . Breast lump in female   . Cancer ([place])     rt  . PE (pulmonary embolism) 03/26/2014  . Neuropathy ([place])   . GERD (gastroesophageal reflux disease)   . History of hiatal hernia     Assessment: 66 y/oF with PMH of metastatic breast cancer undergoing chemotherapy at [facility], PE on chronic Lovenox for anticoagulation, and GERD who presented to [facility] ED for evaluation of possible aspiration and SOB. Patient decompensated in ED and was intubated for respiratory failure. Patient also found to be in a-flutter.  Lovenox held on admission and pharmacy was consulted to assist with dosing of heparin infusion. Noted PTA dose of Lovenox was 50 mg SQ daily (dose was adjusted at [facility] per LMWH levels) with last dose reported as 10/31 at 2100.  Today, 01/16/2015:  CBC reviewed.  No bleeding reported by patient.  LMWH level (trough) ordered per MD.  No interpretable goal range for this timing, but patient appears to be  appropriately clearing medication.  Heparin level above goal range  Goal of Therapy:  Heparin level 0.3-0.7 units/ml Monitor platelets by anticoagulation protocol: Yes   Plan:   Decrease heparin infusion to 900 units/hr   Check 8 hour heparin level  Daily heparin level and CBC while on heparin infusion  Monitor closely for s/s of bleeding  [name], PharmD, BCPS Pager: [phone] 01/15/2015 9:08 PM

## 2015-01-16 NOTE — Progress Notes (Addendum)
Vt on vent decreased to 42m (8cc) due to PIP 49-50 and Pplat 41. On current setting, PIP 41 and Pplat 38. CCM MD/NP aware of vent change. RT will continue to monitor.

## 2015-01-16 NOTE — Significant Event (Signed)
At [phone]- [name] [name] converted out of atrial flutter to NSR. BP improved after HR conversion and levophed was turned off but Neo continues at '200mg'$ /hour.

## 2015-01-16 NOTE — Progress Notes (Signed)
  Echocardiogram 2D Echocardiogram has been performed.  [name] 01/16/2015, 9:55 AM

## 2015-01-16 NOTE — Progress Notes (Signed)
PULMONARY / CRITICAL CARE MEDICINE   Name: [name] [name] MRN: [id number] DOB: 04-08-1940    ADMISSION DATE:  01/15/2015 CONSULTATION DATE:  01/15/15  REFERRING MD :  ED  CHIEF COMPLAINT:  Aspiration, vent dependent respiratory failure, sepsis, atrial flutter.  INITIAL PRESENTATION:  [name] [name] is a 74 year old with metastatic breast cancer being followed at [facility]. Patient was at home drinking orange juice when she began coughing and felt short of breath. 911 was called and she was brought to the ED and intubated for respiratory failure. It was noted that her heart reported in the 180s, atrial flutter. She did not respond to vagal maneuvers or adenosine. Started on Levophed for hypotension.  STUDIES:  CXR 11/1 >> Progressive metastatic disease, B/L pleural effusions.  SIGNIFICANT EVENTS: 11/1 Admit, intubated for respiratory failure aspiration. In AF w/ RVR and shock. Placed on amio gtt, heparin, levophed and neo. CVL placed to guide resuscitation. Abx started.  11/2 pressors weaned to off. Now NSR. Failed SBT and was short of breath on higher levels of PSV. KVO'd IVFs, send urine osmo. Tubefeeds started.     SUBJECTIVE:  Off pressors. No distress. Did not tolerate wean  VITAL SIGNS: Temp:  [99 F (37.2 C)-100.9 F (38.3 C)] 99 F (37.2 C) (11/02 0900) Pulse Rate:  [32-172] 81 (11/02 0900) Resp:  [0-33] 15 (11/02 0900) BP: (63-162)/(38-104) 153/75 mmHg (11/02 0900) SpO2:  [68 %-100 %] 100 % (11/02 0900) Arterial Line BP: (66-112)/(51-96) 109/96 mmHg (11/01 2000) FiO2 (%):  [40 %-100 %] 40 % (11/02 0900) Weight:  [73.7 kg (162 lb 7.7 oz)-74.39 kg (164 lb)] 73.7 kg (162 lb 7.7 oz) (11/01 2000) HEMODYNAMICS: CVP:  [11 mmHg-12 mmHg] 12 mmHg VENTILATOR SETTINGS: Vent Mode:  [-] PRVC FiO2 (%):  [40 %-100 %] 40 % Set Rate:  [20 bmp] 20 bmp Vt Set:  [400 mL-450 mL] 400 mL PEEP:  [5 cmH20] 5 cmH20 Plateau Pressure:  [39 cmH20-47 cmH20] 41 cmH20 INTAKE /  OUTPUT:  Intake/Output Summary (Last 24 hours) at 01/16/15 0937 Last data filed at 01/16/15 0900  Gross per 24 hour  Intake 5755.2 ml  Output    205 ml  Net 5550.2 ml    PHYSICAL EXAMINATION: General:  Awake,  Appropriate and in no distress.  Neuro:  Moves all 4 extremities, cranial nerves intact, no gross focal deficit. Writing notes  ENT:  ET tube in place, dry mucous membranes Cardiovascular:  Now NSR, regular rate, no murmurs rubs or gallops. Lungs: diffuse rhonchi no accessory muscle use but Vt only in 200s evan after titrating PSV trial up to 18 cmH2O Abdomen:  Soft, distended, nontender, positive bowel sounds Skin:  Intact  LABS:  CBC  Recent Labs Lab 01/15/15 1424 01/15/15 2115 01/16/15 0645  WBC 10.4 9.0 12.7*  HGB 11.0* 10.5* 10.1*  HCT 35.9* 33.5* 31.1*  PLT 459* 380 387   Coag's  Recent Labs Lab 01/15/15 1424 01/15/15 2115  APTT 35 26  INR 1.08 1.12   BMET  Recent Labs Lab 01/15/15 1424 01/15/15 1850 01/16/15 0645  NA 129* 128* 122*  K 4.1 4.3 4.4  CL 84* 86* 85*  CO2 34* 30 25  BUN '15 15 17  '$ CREATININE 0.59 0.91 1.17*  GLUCOSE 227* 253* 216*   Electrolytes  Recent Labs Lab 01/15/15 1424 01/15/15 1850 01/16/15 0645  CALCIUM 8.9 8.6* 7.8*  MG  --  1.5* 1.4*  PHOS  --  5.4* 3.8   Sepsis Markers  Recent Labs Lab  01/15/15 1850 01/15/15 1921  LATICACIDVEN 4.5* 4.97*  PROCALCITON 0.28  --    ABG  Recent Labs Lab 01/15/15 1851 01/16/15 0350  PHART 7.392 7.410  PCO2ART 47.1* 41.0  PO2ART 368* 154*   Liver Enzymes  Recent Labs Lab 01/15/15 1424 01/15/15 1850  AST 20 24  ALT 16 16  ALKPHOS 107 99  BILITOT 0.4 0.8  ALBUMIN 3.2* 2.8*   Cardiac Enzymes  Recent Labs Lab 01/15/15 1850 01/16/15 0027 01/16/15 0645  TROPONINI 0.05* 0.07* 0.06*   Glucose  Recent Labs Lab 01/15/15 2024  GLUCAP 216*    Imaging Dg Chest Port 1 View  01/16/2015  CLINICAL DATA:  Respiratory failure. EXAM: PORTABLE CHEST 1 VIEW  COMPARISON:  01/15/2015.  CT 07/03/2014 . FINDINGS: Endotracheal tube, NG tube, PowerPort catheter in stable position. Heart size stable. Mediastinum and hilar structures normal. Bilateral pulmonary infiltrates and mass lesions are stable. Bilateral pleural effusions are stable. No pneumothorax. IMPRESSION: 1. Lines and tubes stable position. 2. Bilateral pulmonary infiltrates mass lesions are stable. Bilateral pleural effusions are stable. Electronically Signed   By: Marcello Moores  Register   On: 01/16/2015 07:13   Dg Chest Port 1 View  01/15/2015  CLINICAL DATA:  CVC and endotracheal tube placement EXAM: PORTABLE CHEST 1 VIEW COMPARISON:  01/15/2015 FINDINGS: Endotracheal tube is been placed with tip measuring 5.5 cm above the carina. Enteric tube tip is off the field of view but below the left hemidiaphragm. Indwelling power port type central venous catheter remains in place with tip over the cavoatrial junction. There is a new left jugular venous line present with tip projecting over the low SVC. No pneumothorax. Small bilateral pleural effusions, greater on the right. Infiltration or atelectasis in the lung bases. Hazy densities in both lungs consistent with known mass lesions. These are less well defined than on the previous study. Normal heart size and pulmonary vascularity. IMPRESSION: Appliances appear in satisfactory location. Bilateral pleural effusions with basilar atelectasis or infiltration and pulmonary mass lesions without significant change since prior study, allowing for technical differences. Electronically Signed   By: Lucienne Capers M.D.   On: 01/15/2015 19:11   Dg Chest Portable 1 View  01/15/2015  CLINICAL DATA:  Metastatic breast cancer. Possible aspiration today. EXAM: PORTABLE CHEST 1 VIEW COMPARISON:  Chest x-ray a 07/03/2014 and CT scan of the same date. FINDINGS: The left IJ power port tip is in the right atrium. There are bilateral pulmonary lesions consistent with known metastatic  disease. The largest lesion in the right upper lobe measures approximately 4.3 cm and previously measured 4.7 cm. The adjacent smaller nodule has enlarged and now measures 3 cm. There are bilateral probable loculated pleural fluid collections. No obvious overlying infiltrates or edema. IMPRESSION: Progressive pulmonary metastatic disease. Bilateral pleural effusions with loculation on the right. A pleural mass is also possible. No definite overlying infiltrates or edema. Electronically Signed   By: Marijo Sanes M.D.   On: 01/15/2015 15:59   Loculated right effusion. Bilateral airspace disease unchanged  ASSESSMENT / PLAN: PULMONARY OETT 11/1>> A: Vent dependent respiratory failure Aspiration Pulmonary embolus 1/16 Probable malignant right effusion (loculated) P:   Continue full vent support for now. Heparin drip for anticoagulation-->no need for CT angio as won't change Rx F/u am cxr   CARDIOVASCULAR CVL 11/1>> A:  Symptomatic AF/flutter w/ RVR w/ associated hypotension-->no resolved  SIRS-->possibly sepsis. Although think that the shock was more AF w/ RVR P:  Stress dose steroids as she is on chronic  prednisone as an outpatient. KVO IVFs amio and IV heparin per cards F/u Echocardiogram  RENAL A:  Hyponatremia (hypoosmolar/hyponatremia) AKI following Hypotension  P:   Ck urine Osmo Renal dose meds KVO IVFs F/u am chemistry  GASTROINTESTINAL A:   Protein calorie malnutrition,  P:   Start tubefeeds Cont PPI  HEMATOLOGIC/ONCOLOGY A:   Metastatic breast cancer-->progressive w/ wide spread metastasis   P:  Update [facility] on major changes Cont current rx  Transfuse per protocol   INFECTIOUS A:   Septic shock Presumed aspiration, UTI.  P:   BCx2 11/1 >> UC 11/1 >> Sputum 11/1>>>  Abx:  Vancomycin, start date 11/1, >>> Aztreonam, start date 11/1, >>> Azithromycin, start date 11/1, >>>11/2  ENDOCRINE A:   Adrenal insufficiency, on chronic  steroids P:   stress dose steroids. F/u TSH.  NEUROLOGIC A:   P:   RASS goal: -1 PAD protocol  Versed PRN   FAMILY  - Updates: Husband updated at bedside. Patient was awake and participated in the conversation. She had been declining over the past few weeks. They both agreed that we would try to be aggressive and treat any temporary condition such as aspiration, sepsis for a few days. If it appears that she is not turning around then they would not wish to prolong her suffering and would consider making her comfort. I also spoke with her oncologist at [place] [phone]) who would like to be updated about any major change in her status.  - Inter-disciplinary family meet or Palliative Care meeting due by: 11/8  Looks better. Off pressors. Volume resuscitated. Now in NSR. Failed attempt at SBT but overall looks like heading the correct direction. Her Na has dropped and she is hypo-osmolar. 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 Will back off on fluids, BP boarderline off pressors so will hold off on lasix for now. Start tube feeds. Hope another day to let the dust settle here and possibly ready to extubate in next 24-48hrs.   Erick Colace ACNP-BC [place] Pager # [phone] OR # [phone] if no answer  Attending:  I have seen and examined the patient with nurse practitioner/resident and agree with the note above.   Ms. cowens has better hemodynamics thsi morning Failed SBT this morning I attemped PSV 15/5 and she had very low lung volumes  On exam:  Vent supported breaths, few crackles on inspiration CV: RRR, no mgr, skin well perfused GI: BS+, soft, nontender  CXR: loculated R sided effusion, basilar consolidation, ETT in place, CVL in place, port in place  Acute respiratory failure with hypoxemia> due to aspiration acutely, but given the high peak pressures on PRVC mode and low tidal volumes on PSV 15/5 she appears to have a very  non-compliant lung> unclear if this is due to her pleural disease (chest wall stiffness from fentanyl?) or if this is due to lung involvement.  Imaging from CT scans at [facility] has not suggested lymphangitic spread of malignancy in lungs but she does have metastatic pulmonary masses; will continue supportive care, antibiotics today, resume pressure support trials in morning  Start low trickle feeds this afternoon  Husband updated bedside  My cc time 34 minutes  Roselie Awkward, MD [place] P[facility]M Pager: [phone] Cell: [phone] After 3pm or if no response, call [phone]

## 2015-01-16 NOTE — Progress Notes (Signed)
[facility] Progress Note Patient Name: EMERI ESTILL DOB: 1941-03-02 MRN: [id number]   Date of Service  01/16/2015  HPI/Events of Note  EOL discussion with Cheri Guppy, patient's husband.  He stated the patient has an advanced directive.  She is communicating that she does not wish for chest compressions or shocking but wants to continue with vent support.  eICU Interventions  Plan: Full code changed to modified DNR with plans to continue with vent and pressor support.  No countershock or chest compressions in the event of a code.     Intervention Category Major Interventions: End of life / care limitation discussion  [place] 01/16/2015, 5:13 AM

## 2015-01-16 NOTE — Progress Notes (Signed)
[facility] Progress Note Patient Name: MILAINA SHER DOB: 1941/01/28 MRN: [id number]   Date of Service  01/16/2015  HPI/Events of Note  Patient getting Fentanyl IV PRN Q 1 hour for pain. Request for Fentanyl IV infusion.   eICU Interventions  Will order Fentanyl IV infusion. Titrate to RASS = 0 to -1.     Intervention Category Intermediate Interventions: Pain - evaluation and management  Danashia Landers [place] 01/16/2015, 3:54 PM

## 2015-01-16 NOTE — Progress Notes (Signed)
Initial Nutrition Assessment  DOCUMENTATION CODES:   Not applicable  INTERVENTION:  - Monitor patient for Mg, K, P for at least 3 days for Refeeding Risk due to low PO intake PTA. Please replete per MD as appropriate.    - Please initiate Vital High Protein @ 25 mL/hr and advanced by 10 mL every 4 hours until goal rate of 65 mL/hr is reached (1560 mL/day).   - Recommend 150 mL free water QID.   - This regime provides 100% of needs: 1560 kcal, 136.5 gm protein, and 1904 mL free water.   - RD team will continue to monitor for needs.   NUTRITION DIAGNOSIS:   Inadequate oral intake related to inability to eat as evidenced by NPO status.   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 She is being followed at [facility] and has failed multiple rounds of chemotherapy. She is currently getting palliative chemotherapy with Gemzar started on 10/04/14. Marland Kitchen  Patient visited for enteral feeding initiation/management consult. Husband and son in room during visit and helped to provide historical data.   Patient has had very low appetite for the past few weeks. Husband estimates patient's usual intake is about 25% of needs. She usually eats a handful of triskets, shrimp or some drinks such as ginger ale or orange juice. Patient recently has started tolerating strawberry smoothies.  Patient has been experiencing taste changes and some nausea. Patient has ongoing swallowing problems, with a previous history of aspiration and problems swallowing medications. Patient is positive for chronic diarrhea.   Patient currently takes several supplements at home - gummy multivitamins, fiber, probiotic  and calcium. She has tried many nutrition supplements in the past, but does not tolerate any of them.   Per chart, patient has weight loss of 12 lbs (7% in past  6 months).   NFPE performed - patient appears well nourished with mild fat wasting - orbital. Suspect some degree of malnutrition, but not able to diagnosis at this time.   Estimated needs calculated using [facility] 2003 (b) equation.    Patient is currently intubated on ventilator support.  MV: 8.3 Temp (24hrs), Avg:99.6 F (37.6 C), Min:98.4 F (36.9 C), Max:100.9 F (38.3 C) Propofol: None  Labs reviewed: low Na (122), low Cl (86), low Cr (1.17), low Ca (7.8), low Mg (1.4)   Diet Order:  Diet NPO time specified  Skin:  Reviewed, no issues  Last BM:  unknown  Height:   Ht Readings from Last 1 Encounters:  01/15/15 _0  (1.575 m)    Weight:   Wt Readings from Last 1 Encounters:  01/15/15 162 lb 7.7 oz (73.7 kg)    Ideal Body Weight:  50 kg  BMI:  Body mass index is 29.71 kg/(m^2).  Estimated Nutritional Needs:   Kcal:  1588 kcal  Protein:  95-105 gm  Fluid:  >/= 1.5 L/day  EDUCATION NEEDS:   No education needs identified at this time  Kayleen Memos, Dietetic Intern 01/16/2015 3:21 PM

## 2015-01-16 NOTE — Progress Notes (Signed)
PHARMACIST - PHYSICIAN COMMUNICATION CONCERNING:  IV Heparin  17 yoF with hx metastatic breast cancer and hx PE on lovenox PTA now on IV heparin bridge, also noted to be in a-flutter.  Please see note written by Vinetta Bergamo, PharmD earlier today for full details.    Heparin currently at 900 units/hr and level tonight is supratherapeutic @ 0.79 (goal 0.3-0.7).  No bleeding or infusion issues noted per RN.    RECOMMENDATION: Reduce heparin infusion to 800 units/hr.  Recheck level in 8 hours.    Ralene Bathe, PharmD, BCPS 01/16/2015, 5:45 PM  Pager: [phone]

## 2015-01-16 NOTE — Care Management Note (Signed)
Case Management Note  Patient Details  Name: [name] [name] MRN: [id number] Date of Birth: Aug 21, 1940  Subjective/Objective:         Tachypnea, resp distress and aspiration           Action/Plan: Date: January 16, 2015 Chart reviewed for concurrent status and case management needs. Will continue to follow patient for changes and needs: Velva Harman, RN, BSN, [place]   [phone]  Expected Discharge Date:   (unknown)               Expected Discharge Plan:  Home/Self Care  In-House Referral:  Clinical Social Work  Discharge planning Services  CM Consult  Post Acute Care Choice:  NA Choice offered to:  NA  DME Arranged:    DME Agency:     HH Arranged:    [place] Agency:     Status of Service:  In process, will continue to follow  Medicare Important Message Given:    Date Medicare IM Given:    Medicare IM give by:    Date Additional Medicare IM Given:    Additional Medicare Important Message give by:     If discussed at [place] of Stay Meetings, dates discussed:    Additional Comments:  Leeroy Cha, RN 01/16/2015, 8:37 AM

## 2015-01-16 NOTE — Progress Notes (Signed)
[place] Progress Note Patient Name: [name] [name] DOB: [birth year] MRN: [id number]   Date of Service  01/16/2015  HPI/Events of Note  Patient with FSBS over 160  eICU Interventions  Start phase 1 SSI        Flora Lipps 01/16/2015, 11:58 PM

## 2015-01-17 ENCOUNTER — Inpatient Hospital Stay (HOSPITAL_COMMUNITY): Payer: Medicare Other

## 2015-01-17 DIAGNOSIS — I5031 Acute diastolic (congestive) heart failure: Secondary | ICD-10-CM | POA: Diagnosis not present

## 2015-01-17 LAB — BASIC METABOLIC PANEL
ANION GAP: 7 (ref 5–15)
BUN: 16 mg/dL (ref 6–20)
CALCIUM: 8.3 mg/dL — AB (ref 8.9–10.3)
CO2: 31 mmol/L (ref 22–32)
Chloride: 92 mmol/L — ABNORMAL LOW (ref 101–111)
Creatinine, Ser: 0.56 mg/dL (ref 0.44–1.00)
GFR calc Af Amer: >60 mL/min (ref >60)
Glucose, Bld: 161 mg/dL — ABNORMAL HIGH (ref 65–99)
POTASSIUM: 3.7 mmol/L (ref 3.5–5.1)
SODIUM: 130 mmol/L — AB (ref 135–145)

## 2015-01-17 LAB — GLUCOSE, CAPILLARY
GLUCOSE-CAPILLARY: 165 mg/dL — AB (ref 65–99)
GLUCOSE-CAPILLARY: 174 mg/dL — AB (ref 65–99)
GLUCOSE-CAPILLARY: 94 mg/dL (ref 65–99)
Glucose-Capillary: 154 mg/dL — ABNORMAL HIGH (ref 65–99)
Glucose-Capillary: 124 mg/dL — ABNORMAL HIGH (ref 65–99)
Glucose-Capillary: 191 mg/dL — ABNORMAL HIGH (ref 65–99)

## 2015-01-17 LAB — CBC
HCT: 28.6 % — ABNORMAL LOW (ref 36.0–46.0)
Hemoglobin: 9.1 g/dL — ABNORMAL LOW (ref 12.0–15.0)
MCH: 29.9 pg (ref 26.0–34.0)
MCHC: 31.8 g/dL (ref 30.0–36.0)
MCV: 94.1 fL (ref 78.0–100.0)
PLATELETS: 387 10*3/uL (ref 150–400)
RBC: 3.04 MIL/uL — AB (ref 3.87–5.11)
RDW: 20.6 % — ABNORMAL HIGH (ref 11.5–15.5)
WBC: 10.2 10*3/uL (ref 4.0–10.5)

## 2015-01-17 LAB — LEGIONELLA PNEUMOPHILA SEROGP 1 UR AG: L. pneumophila Serogp 1 Ur Ag: NEGATIVE

## 2015-01-17 LAB — HEPARIN LEVEL (UNFRACTIONATED)
HEPARIN UNFRACTIONATED: 0.57 [IU]/mL (ref 0.30–0.70)
HEPARIN UNFRACTIONATED: 0.51 [IU]/mL (ref 0.30–0.70)

## 2015-01-17 MED ORDER — ONDANSETRON HCL 4 MG/2ML IJ SOLN
4.0000 mg | Freq: Four times a day (QID) | INTRAMUSCULAR | Status: DC | PRN
  Administered 2015-01-17 – 2015-01-29 (×14): 4 mg via INTRAVENOUS
  Filled 2015-01-17 (×14): qty 2

## 2015-01-17 MED ORDER — VITAL HIGH PROTEIN PO LIQD
1000.0000 mL | ORAL | Status: DC
  Administered 2015-01-18 – 2015-01-27 (×7): 1000 mL
  Filled 2015-01-17 (×15): qty 1000

## 2015-01-17 MED ORDER — FUROSEMIDE 10 MG/ML IJ SOLN
40.0000 mg | Freq: Four times a day (QID) | INTRAMUSCULAR | Status: AC
  Administered 2015-01-17 (×2): 40 mg via INTRAVENOUS
  Filled 2015-01-17 (×2): qty 4

## 2015-01-17 NOTE — Progress Notes (Signed)
Advanced og tube 7cm per xray result.  Will order xray to confirm placement.  Irven Baltimore, RN

## 2015-01-17 NOTE — Progress Notes (Signed)
[place] Progress Note Patient Name: SHELSEY RIETH DOB: 02-Jun-1940 MRN: [id number]   Date of Service  01/17/2015  HPI/Events of Note  Nausea. No enteral nutrition residuals and abdomen is soft.   eICU Interventions  Will order: 1. Zofran 4 mg IV Q 6 hours PRN nausea or vomiting. 2. Monitor QTc interval Q 5 hours. Notify MD if QTc interval > 500 milliseconds.      Intervention Category Intermediate Interventions: Other:  Myldred Raju Cornelia Copa 01/17/2015, 8:30 PM

## 2015-01-17 NOTE — Progress Notes (Signed)
PULMONARY / CRITICAL CARE MEDICINE   Name: [name] [name] MRN: [id number] DOB: 1940/09/07    ADMISSION DATE:  01/15/2015 CONSULTATION DATE:  01/15/15  REFERRING MD :  ED  CHIEF COMPLAINT:  Aspiration, vent dependent respiratory failure, sepsis, atrial flutter.  INITIAL PRESENTATION:  [name] [name] is a 74 year old with metastatic breast cancer being followed at [facility]. Patient was at home drinking orange juice when she began coughing and felt short of breath. 911 was called and she was brought to the ED and intubated for respiratory failure. It was noted that her heart reported in the 180s, atrial flutter. She did not respond to vagal maneuvers or adenosine. Started on Levophed for hypotension.  STUDIES:  CXR 11/1 >> Progressive metastatic disease, B/L pleural effusions.  SIGNIFICANT EVENTS: 11/1 Admit, intubated for respiratory failure aspiration. In AF w/ RVR and shock. Placed on amio gtt, heparin, levophed and neo. CVL placed to guide resuscitation. Abx started.  11/2 pressors weaned to off. Now NSR. Failed SBT and was short of breath on higher levels of PSV. KVO'd IVFs, send urine osmo. Tubefeeds started.  11/2 Echocardiogram> LVEF 55-60%, PASP 39 mmHg   SUBJECTIVE:  Placed on fentanyl infusion yesterday for pain. Maintained on this overnight    VITAL SIGNS: Temp:  [97.9 F (36.6 C)-99 F (37.2 C)] 97.9 F (36.6 C) (11/03 0500) Pulse Rate:  [66-81] 66 (11/03 0402) Resp:  [14-33] 20 (11/03 0600) BP: (88-153)/(52-79) 107/62 mmHg (11/03 0402) SpO2:  [97 %-100 %] 100 % (11/03 0600) FiO2 (%):  [40 %] 40 % (11/03 0402) Weight:  [75.8 kg (167 lb 1.7 oz)] 75.8 kg (167 lb 1.7 oz) (11/03 0455) HEMODYNAMICS: CVP:  [9 mmHg-19 mmHg] 11 mmHg VENTILATOR SETTINGS: Vent Mode:  [-] PRVC FiO2 (%):  [40 %] 40 % Set Rate:  [20 bmp] 20 bmp Vt Set:  [400 mL-450 mL] 400 mL PEEP:  [5 cmH20] 5 cmH20 Plateau Pressure:  [25 [id number] cmH20] 25 cmH20 INTAKE / OUTPUT:  Intake/Output  Summary (Last 24 hours) at 01/17/15 8250 Last data filed at 01/17/15 0600  Gross per 24 hour  Intake 2125.57 ml  Output   2450 ml  Net -324.43 ml    PHYSICAL EXAMINATION: General:  Awake on vent, comfortable HEENT: NCAT, ETT in place PULM: diminished R base, shallow, vent supported breaths without crackles CV: RRR, no mgr GI: BS+, soft, nontender MSK: diminished tone Derm: bruises extensor surfaces, ecchymosis Neuro: A&Ox4, maew  LABS:  CBC  Recent Labs Lab 01/15/15 2115 01/16/15 0645 01/17/15 0250  WBC 9.0 12.7* 10.2  HGB 10.5* 10.1* 9.1*  HCT 33.5* 31.1* 28.6*  PLT 380 387 387   Coag's  Recent Labs Lab 01/15/15 1424 01/15/15 2115  APTT 35 26  INR 1.08 1.12   BMET  Recent Labs Lab 01/15/15 1850 01/16/15 0645 01/17/15 0250  NA 128* 122* 130*  K 4.3 4.4 3.7  CL 86* 85* 92*  CO2 '30 25 31  '$ BUN '15 17 16  '$ CREATININE 0.91 1.17* 0.56  GLUCOSE 253* 216* 161*   Electrolytes  Recent Labs Lab 01/15/15 1850 01/16/15 0645 01/17/15 0250  CALCIUM 8.6* 7.8* 8.3*  MG 1.5* 1.4*  --   PHOS 5.4* 3.8  --    Sepsis Markers  Recent Labs Lab 01/15/15 1850 01/15/15 1921  LATICACIDVEN 4.5* 4.97*  PROCALCITON 0.28  --    ABG  Recent Labs Lab 01/15/15 1851 01/16/15 0350  PHART 7.392 7.410  PCO2ART 47.1* 41.0  PO2ART 368* 154*   Liver Enzymes  Recent Labs Lab 01/15/15 1424 01/15/15 1850  AST 20 24  ALT 16 16  ALKPHOS 107 99  BILITOT 0.4 0.8  ALBUMIN 3.2* 2.8*   Cardiac Enzymes  Recent Labs Lab 01/15/15 1850 01/16/15 0027 01/16/15 0645  TROPONINI 0.05* 0.07* 0.06*   Glucose  Recent Labs Lab 01/15/15 2024 01/16/15 1555 01/16/15 1944 01/16/15 2349 01/17/15 0352 01/17/15 0736  GLUCAP 216* 147* 146* 165* 154* 124*    Imaging 01/17/2015 CXR > babasilar infiltrates OK, loculated effusion R, essentially unchanged  ASSESSMENT / PLAN: PULMONARY OETT 11/1>> A: Vent dependent respiratory failure due to Aspiration  pneumonia/HCAP 11/3>likely pulmonary edema Pulmonary embolus 1/16 Probable malignant right effusion (loculated) P:   Continue full vent support for now Diuresis today SBT trials daily Heparin drip  Daily CXR   CARDIOVASCULAR CVL 11/1>> A:  Symptomatic AF/flutter w/ RVR w/ associated hypotension-->resolved Septic shock> resolved but still hypotensive overnight due to sedation P:  Continue Hydrocortisone IV KVO IVFs  Continue IV amiodarone today, per cardiology Tele  RENAL A:  Hyponatremia> improving, Urine OSM higher than expected, 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 AKI following hypotension  P:   Monitor BMET and UOP Replace electrolytes as needed  GASTROINTESTINAL A:   Protein calorie malnutrition P:   Continue tubefeeds Cont PPI  HEMATOLOGIC/ONCOLOGY A:   Metastatic breast cancer-->progressive w/ wide spread metastasis   P:  Update [facility] on major changes Cont current rx    INFECTIOUS A:   Septic shock due to HCAP P:   BCx2 11/1 >> UC 11/1 >> Sputum 11/1>>>  Abx:  Vancomycin, start date 11/1, >>> Aztreonam, start date 11/1, >>> Azithromycin, start date 11/1, >>>11/2  ENDOCRINE A:   Relative adrenal insufficiency, on chronic steroids P:   Continue stress dose steroids   NEUROLOGIC A:  Very sensitive to sedation Pain related to cancer P:   RASS goal: -1 PAD protocol with fentanyl gtt D/c versed   FAMILY  - Updates: Husband updated at bedside 11/3  - Inter-disciplinary family meet or Palliative Care meeting due by: 11/8  Breathing appears better today.  Will wean sedation, give diuresis, pressure support ventilation as able today   My cc time 35 minutes  [name] Awkward, MD [place] [facility] Pager: [phone] Cell: [phone] After 3pm or if no response, call [phone]

## 2015-01-17 NOTE — Progress Notes (Signed)
OG tube pulled loose from tape.  Cut tube feeds off, advanced tube,  auscultated gastric sounds, and called xray to verify placement.   Irven Baltimore, RN

## 2015-01-17 NOTE — Progress Notes (Signed)
Called and notified E-link MD that patient's CBG had increased to 165. She is receiving continuous tube feeding. New orders were written. Also notified E-link MD that patient's RASS score has been -1. RASS goal per order is -3. She is oriented and is able to follow commands. She can communicate with writing. She is tolerating ventilator well. Was told RASS score of -1  was okay, no new orders were written.

## 2015-01-17 NOTE — Progress Notes (Signed)
PHARMACIST - PHYSICIAN COMMUNICATION CONCERNING:  IV Heparin  32 yoF with hx metastatic breast cancer and hx PE on lovenox PTA now on IV heparin bridge, also noted to be in a-flutter.  Please see note written by Vinetta Bergamo, 11/2 full details.    Heparin currently at 800 units/hr and level tonight is therapeutic @ 0.57 (goal 0.3-0.7).  No bleeding or infusion issues noted per RN.    RECOMMENDATION: Continue heparin infusion to 800 units/hr.  Recheck level today.   Lawana Pai R 01/17/2015, 3:30 AM

## 2015-01-17 NOTE — Progress Notes (Signed)
Daughter and patient included nurse in conversation about patient's cancer and desire to possibly stop with current treatment. Pt's daughter said patient was scheduled for an MRI and CT scan 11/4 to determine if chemotherapy was working or if the cancer had spread. Daughter indicated that her and her mother have been having conversations prior to this hospital admission regarding patient's fatigue from treatment and desire to possibly stop chemotherapy if it wasn't effective. Pt told daughter before hospital admission it was getting more and more difficult to recover from chemotherapy. Pt indicated that she is very tired tonight and worried she won't be able to participate in SBT in the morning. Pt RASS 1 goal per order -1. Patient has been refusing to receive any pain medication today besides Tylenol. I educated daughter and patient about the purpose of medication while on the ventilator and that it could help with some of the restlessness and gagging patient is experiencing almost constantly. Pt agreed with restarting low-dose fentanyl to help keep her comfortable during the night.  Daughter and patient requesting to have MRI and CT scan ASAP to determine extent of chemotherapy effectiveness as this would determine their choice to continue with current aggressive treatment or not.

## 2015-01-17 NOTE — Progress Notes (Signed)
Nutrition Follow-up  INTERVENTION:   -Continue to monitor patient for Mg, K, P for at least 3 days for Refeeding Risk due to low PO intake PTA. Please replete per MD as appropriate.   Continue to advance Vital HP @ 50 mL/hr and advance by 10 mL every 4 hours until goal rate of 55 mL/hr is reached.   - Recommend 100 mL free water QID.   - This regime provides 100% of needs: 1320 kcal, 116 gm protein, and 1104 mL free water.   - RD team will continue to monitor for needs.   NUTRITION DIAGNOSIS:   Inadequate oral intake related to inability to eat as evidenced by NPO status.  Ongoing.  GOAL:   Patient will meet greater than or equal to 90% of their needs  Not meeting yet.  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 She is being followed at [facility] and has failed multiple rounds of chemotherapy. She is currently getting palliative chemotherapy with Gemzar started on 10/04/14. .  Tube feeding continues to be advance, not yet at goal. Order adjusted to reflect new estimated needs based on changes in Ve.   Patient is currently intubated on ventilator support MV: 8.6 L/min Temp (24hrs), Avg:98.3 F (36.8 C), Min:97.9 F (36.6 C), Max:98.8 F (37.1 C)  Propofol: none  Labs reviewed: CBGs: 124-191 Low Na  Diet Order:  Diet NPO time specified  Skin:  Reviewed, no issues  Last BM:  11/1  Height:   Ht Readings from Last 1 Encounters:  01/15/15 _0  (1.575 m)    Weight:   Wt Readings from Last 1 Encounters:  01/17/15 167 lb 1.7 oz (75.8 kg)    Ideal Body Weight:  50 kg  BMI:  Body mass index is 30.56 kg/(m^2).  Estimated Nutritional Needs:   Kcal:  1339  Protein:  110-120g  Fluid:  1.5L/day  EDUCATION NEEDS:   No education needs identified at this time  Clayton Bibles, MS, RD, LDN Pager:  [phone] After Hours Pager: [phone]

## 2015-01-17 NOTE — Progress Notes (Signed)
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 She has failed multiple rounds of chemotherapy and is currently getting palliative chemo. Also with h/o PE on chronic anticoagulation with Lovenox. H/o influenza, HCAP and postobstructive PNA 06/2014.  Admitted to [facility] 01/15/15 for acute respiratory failure in the setting of aspiration, sepsis and new atrial flutter w/ RVR.  Subjective: Remains Intubated. Husband by bedside. Per RN, no events overnight.  Objective: Vital signs in last 24 hours: Temp:  [97.9 F (36.6 C)-99 F (37.2 C)] 97.9 F (36.6 C) (11/03 0500) Pulse Rate:  [66-81] 66 (11/03 0402) Resp:  [14-33] 20 (11/03 0600) BP: (88-153)/(52-79) 107/62 mmHg (11/03 0402) SpO2:  [97 %-100 %] 100 % (11/03 0600) FiO2 (%):  [40 %] 40 % (11/03 0402) Weight:  [167 lb 1.7 oz (75.8 kg)] 167 lb 1.7 oz (75.8 kg) (11/03 0455) Last BM Date:  (PTA)  Intake/Output from previous day: 11/02 0701 - 11/03 0700 In: 2250.6 [I.V.:1800.6; IV Piggyback:450] Out: 2530 [Urine:2530] Intake/Output this shift:    Medications Current Facility-Administered Medications  Medication Dose Route Frequency Provider Last Rate Last Dose  . 0.9 %  sodium chloride infusion  1,000 mL Intravenous Continuous Linwood Dibbles, MD 20 mL/hr at 01/15/15 1428 1,000 mL at 01/15/15 1428  . 0.9 %  sodium chloride infusion   Intravenous Continuous Simonne Martinet, NP 10 mL/hr at 01/16/15 1025    . acetaminophen (TYLENOL) tablet 1,000 mg  1,000 mg Per Tube Q6H PRN Zigmund Gottron, MD   1,000 mg at 01/16/15 0000  . amiodarone (NEXTERONE PREMIX) 360 MG/200ML (1.8 mg/mL) IV infusion  30 mg/hr Intravenous Continuous Linwood Dibbles, MD 16.7 mL/hr at 01/16/15 1946 30 mg/hr at 01/16/15 1946  . antiseptic oral rinse solution (CORINZ)  7 mL Mouth Rinse QID Lupita Leash, MD   7 mL at 01/17/15 0453  . aztreonam (AZACTAM) 2 g in  dextrose 5 % 50 mL IVPB  2 g Intravenous Q8H Praveen Mannam, MD   2 g at 01/17/15 0527  . chlorhexidine gluconate (PERIDEX) 0.12 % solution 15 mL  15 mL Mouth Rinse BID Lupita Leash, MD   15 mL at 01/17/15 0803  . feeding supplement (VITAL HIGH PROTEIN) liquid 1,000 mL  1,000 mL Per Tube Continuous Tilda Franco, RD 50 mL/hr at 01/17/15 0711 1,000 mL at 01/17/15 0711  . fentaNYL (SUBLIMAZE) 2,500 mcg in sodium chloride 0.9 % 250 mL (10 mcg/mL) infusion  50-100 mcg/hr Intravenous Titrated Karl Ito, MD   Stopped at 01/17/15 [phone]  . fentaNYL (SUBLIMAZE) injection 100 mcg  100 mcg Intravenous Q2H PRN Linwood Dibbles, MD   100 mcg at 01/16/15 1225  . heparin ADULT infusion 100 units/mL (25000 units/250 mL)  800 Units/hr Intravenous Continuous Praveen Mannam, MD 8 mL/hr at 01/16/15 2339 800 Units/hr at 01/16/15 2339  . hydrocortisone sodium succinate (SOLU-CORTEF) 100 MG injection 100 mg  100 mg Intravenous 3 times per day Chilton Greathouse, MD   100 mg at 01/17/15 0527  . insulin aspart (novoLOG) injection 2-6 Units  2-6 Units Subcutaneous 6 times per day Erin Fulling, MD   2 Units at 01/17/15 0800  . magic mouthwash  5 mL Oral Q6H PRN Simonne Martinet, NP      . midazolam (VERSED) injection 1 mg  1 mg Intravenous Q15 min PRN Linwood Dibbles, MD   1 mg at 01/15/15 1800  . midazolam (VERSED) injection 1 mg  1 mg Intravenous Q2H PRN Dorie Rank, MD   1 mg at 01/16/15 1403  . norepinephrine (LEVOPHED) 4 mg in dextrose 5 % 250 mL (0.016 mg/mL) infusion  2-50 mcg/min Intravenous Continuous Marshell Garfinkel, MD   Stopped at 01/16/15 0700  . nystatin (MYCOSTATIN) 100000 UNIT/ML suspension 500,000 Units  5 mL Oral QID Erick Colace, NP   500,000 Units at 01/16/15 1400  . pantoprazole (PROTONIX) injection 40 mg  40 mg Intravenous Q24H Praveen Mannam, MD   40 mg at 01/16/15 2001  . phenylephrine (NEO-SYNEPHRINE) 40 mg in dextrose 5 % 250 mL (0.16 mg/mL) infusion  30-200 mcg/min Intravenous Continuous Praveen Mannam, MD 24.4  mL/hr at 01/17/15 0501 65 mcg/min at 01/17/15 0501  . sodium chloride 0.9 % bolus 1,000 mL  1,000 mL Intravenous Once Praveen Mannam, MD      . vancomycin (VANCOCIN) IVPB 750 mg/150 ml premix  750 mg Intravenous Q12H Praveen Mannam, MD   750 mg at 01/16/15 2252    PE: General appearance: alert, cooperative, no distress and intubated Neck: no carotid bruit and no JVD Lungs: Intubated. CTAB Heart: regular rate and rhythm Extremities: no LEE Pulses: 2+ and symmetric Skin: warm and dry Neurologic: Grossly normal  Lab Results:   Recent Labs  01/15/15 2115 01/16/15 0645 01/17/15 0250  WBC 9.0 12.7* 10.2  HGB 10.5* 10.1* 9.1*  HCT 33.5* 31.1* 28.6*  PLT 380 387 387   BMET  Recent Labs  01/15/15 1850 01/16/15 0645 01/17/15 0250  NA 128* 122* 130*  K 4.3 4.4 3.7  CL 86* 85* 92*  CO2 $Re'30 25 31  'CZn$ GLUCOSE 253* 216* 161*  BUN $Re'15 17 16  'zMP$ CREATININE 0.91 1.17* 0.56  CALCIUM 8.6* 7.8* 8.3*   PT/INR  Recent Labs  01/15/15 1424 01/15/15 2115  LABPROT 14.2 14.6  INR 1.08 1.12   Cardiac Panel (last 3 results)  Recent Labs  01/15/15 1850 01/16/15 0027 01/16/15 0645  TROPONINI 0.05* 0.07* 0.06*    Studies/Results: Study Conclusions  - Left ventricle: The cavity size was normal. Wall thickness was normal. Systolic function was normal. The estimated ejection fraction was in the range of 55% to 60%. Wall motion was normal; there were no regional wall motion abnormalities. - Mitral valve: There was mild regurgitation. - Pulmonary arteries: Systolic pressure was mildly to moderately increased. PA peak pressure: 39 mm Hg (S).  Pericardium: There was no pericardial effusion.   Assessment/Plan  Active Problems:   Shock ([place])   Respiratory failure (HCC)   Atrial flutter with rapid ventricular response (HCC)   Acute respiratory failure with hypoxia ([place])  1. New onset atrial flutter of unknown duration with RVR: In the setting of underlying pulmonary  process. TSH normal. 2D echo with normal LVF and no effusion.  Successful conversion back to NSR on IV amiodarone. Maintaining NSR. HR controlled in the 80s. Currently on IV heparin. Was on chronic Lovenox for h/o PE.   2. Acute respiratory failure secondary to aspiration in setting of significant metastatic breast CA with pleural effusions: Remains intubated. No significant pulmonary edema noted on chest xray at time of admit. 2D echo with normal LVEF of 55-60% with normal wall motion. No pericardial effusion. BNP abnormal at 233. May consider a dose of lasix to see if any additional improvement in respiratory status. Renal function is normal.   3. 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 Per spouse, she has been declining rapidly over the past few weeks with  weakness, malaise and poor PO intake.  4. Hypotension with shock: ? Secondary to acute respiratory failure with hypoxia and aspiration, sepsis from UTI, dehydration and rapid atrial flutter. Continue aggressive IVF resuscitation and Levophed as needed for BP support. Also needs stress dose steroids due to chronic prednisone use.  5. History of PE with DVT on SQ Lovenox: changed to IV Heparin gtt. Need to consider recurrent PE in setting of acute hypoxia and hypotension. D-dimer is abnormal at 1.20. Consider CT of chest to r/o recurrent PE.   5. History of influenza, HCAP and postobstructive PNA 06/2014     LOS: 2 days    Zahari Xiang M. Rosita Fire, PA-C 01/17/2015 8:12 AM

## 2015-01-17 NOTE — Progress Notes (Signed)
[place] for Heparin Indication: History of PE on therapeutic lovenox PTA; atrial flutter  Allergies  Allergen Reactions  . Albuterol Shortness Of Breath    Tachycardia, feels like she is going to die  . Penicillins Other (See Comments)    Childhood reaction Has patient had a PCN reaction causing immediate rash, facial/tongue/throat swelling, SOB or lightheadedness with hypotension: unknown Has patient had a PCN reaction causing severe rash involving mucus membranes or skin necrosis: unknown Has patient had a PCN reaction that required hospitalization unknown Has patient had a PCN reaction occurring within the last 10 years: unknown If all of the above answers are "NO", then may proceed with Cephalosporin use.   . Albuterol Sulfate Other (See Comments)    TACHYCARDIA  . Codeine Nausea Only and Other (See Comments)    woosy  . Ibuprofen Other (See Comments)    Induces patient asthma  . Meperidine Nausea Only    Other reaction(s): Other (See Comments) "wiped out" and nausea  . Other     Unknown narcotic  . Phenobarbital Other (See Comments)    Erratic behavior, hyper, jittery  . Adhesive [Tape] Rash    Patient Measurements: Height: '5\' 2"'$  (157.5 cm) Weight: 167 lb 1.7 oz (75.8 kg) IBW/kg (Calculated) : 50.1 HEPARIN DW (KG): 65.9   Vital Signs: Temp: 97.9 F (36.6 C) (11/03 0820) Temp Source: Core (Comment) (11/03 0820) BP: 161/82 mmHg (11/03 0837) Pulse Rate: 66 (11/03 0402)  Labs:  Recent Labs  01/15/15 1424 01/15/15 1850 01/15/15 2115 01/16/15 0027  01/16/15 0645 01/16/15 1630 01/17/15 0250 01/17/15 1000  HGB 11.0*  --  10.5*  --   --  10.1*  --  9.1*  --   HCT 35.9*  --  33.5*  --   --  31.1*  --  28.6*  --   PLT 459*  --  380  --   --  387  --  387  --   APTT 35  --  26  --   --   --   --   --   --   LABPROT 14.2  --  14.6  --   --   --   --   --   --   INR 1.08  --  1.12  --   --   --   --   --   --    HEPARINUNFRC  --   --   --   --   < > 0.99* 0.79* 0.57 0.51  CREATININE 0.59 0.91  --   --   --  1.17*  --  0.56  --   TROPONINI  --  0.05*  --  0.07*  --  0.06*  --   --   --   < > = values in this interval not displayed.  Estimated Creatinine Clearance: 59.7 mL/min (by C-G formula based on Cr of 0.56).   Medical History: Past Medical History  Diagnosis Date  . Asthma   . Breast pain in female     throbbing  . Abdominal pain   . Breast lump in female   . Cancer ([place])     rt  . PE (pulmonary embolism) 03/26/2014  . Neuropathy ([place])   . GERD (gastroesophageal reflux disease)   . History of hiatal hernia     Assessment: 43 y/oF with PMH of metastatic breast cancer undergoing chemotherapy at [facility], PE on chronic Lovenox for anticoagulation, and GERD  who presented to [facility] ED for evaluation of possible aspiration and SOB. Patient decompensated in ED and was intubated for respiratory failure. Patient also found to be in a-flutter.  Lovenox held on admission and pharmacy was consulted to assist with dosing of heparin infusion. Noted PTA dose of Lovenox was 50 mg SQ daily (dose was adjusted at [facility] per LMWH levels) with last dose reported as 10/31 at 2100.  Today, 01/17/2015:  CBC reviewed.  No bleeding reported.  Heparin level remains in goal range  Goal of Therapy:  Heparin level 0.3-0.7 units/ml Monitor platelets by anticoagulation protocol: Yes   Plan:   Continue heparin infusion at 800 units/hr   Daily heparin level and CBC while on heparin infusion  Monitor closely for s/s of bleeding  F/U plans to resume home Lovenox  [name], PharmD, BCPS Pager: [phone] 01/15/2015 9:08 PM

## 2015-01-18 ENCOUNTER — Inpatient Hospital Stay (HOSPITAL_COMMUNITY): Payer: Medicare Other

## 2015-01-18 DIAGNOSIS — I5031 Acute diastolic (congestive) heart failure: Secondary | ICD-10-CM

## 2015-01-18 DIAGNOSIS — J690 Pneumonitis due to inhalation of food and vomit: Secondary | ICD-10-CM | POA: Insufficient documentation

## 2015-01-18 DIAGNOSIS — C50919 Malignant neoplasm of unspecified site of unspecified female breast: Secondary | ICD-10-CM | POA: Insufficient documentation

## 2015-01-18 DIAGNOSIS — C799 Secondary malignant neoplasm of unspecified site: Secondary | ICD-10-CM

## 2015-01-18 DIAGNOSIS — J9601 Acute respiratory failure with hypoxia: Secondary | ICD-10-CM | POA: Insufficient documentation

## 2015-01-18 LAB — BASIC METABOLIC PANEL
Anion gap: 9 (ref 5–15)
BUN: 20 mg/dL (ref 6–20)
CHLORIDE: 91 mmol/L — AB (ref 101–111)
CO2: 35 mmol/L — AB (ref 22–32)
Calcium: 8.2 mg/dL — ABNORMAL LOW (ref 8.9–10.3)
Creatinine, Ser: 0.49 mg/dL (ref 0.44–1.00)
GFR calc Af Amer: >60 mL/min (ref >60)
GFR calc non Af Amer: >60 mL/min (ref >60)
GLUCOSE: 111 mg/dL — AB (ref 65–99)
POTASSIUM: 3.2 mmol/L — AB (ref 3.5–5.1)
Sodium: 135 mmol/L (ref 135–145)

## 2015-01-18 LAB — CBC
HEMATOCRIT: 27.9 % — AB (ref 36.0–46.0)
HEMOGLOBIN: 9.1 g/dL — AB (ref 12.0–15.0)
MCH: 31.2 pg (ref 26.0–34.0)
MCHC: 32.6 g/dL (ref 30.0–36.0)
MCV: 95.5 fL (ref 78.0–100.0)
Platelets: 385 10*3/uL (ref 150–400)
RBC: 2.92 MIL/uL — ABNORMAL LOW (ref 3.87–5.11)
RDW: 20.2 % — ABNORMAL HIGH (ref 11.5–15.5)
WBC: 10.7 10*3/uL — ABNORMAL HIGH (ref 4.0–10.5)

## 2015-01-18 LAB — URINE CULTURE

## 2015-01-18 LAB — MAGNESIUM: Magnesium: 1.6 mg/dL — ABNORMAL LOW (ref 1.7–2.4)

## 2015-01-18 LAB — GLUCOSE, CAPILLARY
GLUCOSE-CAPILLARY: 126 mg/dL — AB (ref 65–99)
GLUCOSE-CAPILLARY: 97 mg/dL (ref 65–99)
Glucose-Capillary: 111 mg/dL — ABNORMAL HIGH (ref 65–99)
Glucose-Capillary: 128 mg/dL — ABNORMAL HIGH (ref 65–99)
Glucose-Capillary: 170 mg/dL — ABNORMAL HIGH (ref 65–99)
Glucose-Capillary: 87 mg/dL (ref 65–99)

## 2015-01-18 LAB — PHOSPHORUS: Phosphorus: 1.8 mg/dL — ABNORMAL LOW (ref 2.5–4.6)

## 2015-01-18 LAB — HEPARIN LEVEL (UNFRACTIONATED): Heparin Unfractionated: 0.49 IU/mL (ref 0.30–0.70)

## 2015-01-18 MED ORDER — LEVOFLOXACIN IN D5W 750 MG/150ML IV SOLN
750.0000 mg | INTRAVENOUS | Status: DC
  Administered 2015-01-18 – 2015-01-23 (×6): 750 mg via INTRAVENOUS
  Filled 2015-01-18 (×6): qty 150

## 2015-01-18 MED ORDER — PROMETHAZINE HCL 25 MG/ML IJ SOLN
12.5000 mg | Freq: Four times a day (QID) | INTRAMUSCULAR | Status: DC | PRN
  Administered 2015-01-20 – 2015-01-29 (×14): 12.5 mg via INTRAVENOUS
  Filled 2015-01-18 (×19): qty 1

## 2015-01-18 MED ORDER — HYDROCORTISONE NA SUCCINATE PF 100 MG IJ SOLR
100.0000 mg | Freq: Two times a day (BID) | INTRAMUSCULAR | Status: DC
  Administered 2015-01-18 – 2015-01-25 (×13): 100 mg via INTRAVENOUS
  Filled 2015-01-18 (×14): qty 2

## 2015-01-18 MED ORDER — IPRATROPIUM BROMIDE 0.02 % IN SOLN
0.5000 mg | Freq: Four times a day (QID) | RESPIRATORY_TRACT | Status: DC
  Administered 2015-01-18 – 2015-01-30 (×47): 0.5 mg via RESPIRATORY_TRACT
  Filled 2015-01-18 (×47): qty 2.5

## 2015-01-18 MED ORDER — ENOXAPARIN SODIUM 60 MG/0.6ML ~~LOC~~ SOLN
50.0000 mg | Freq: Every day | SUBCUTANEOUS | Status: DC
  Administered 2015-01-18 – 2015-01-28 (×11): 50 mg via SUBCUTANEOUS
  Filled 2015-01-18 (×13): qty 0.6

## 2015-01-18 MED ORDER — POTASSIUM CHLORIDE 20 MEQ/15ML (10%) PO SOLN
30.0000 meq | ORAL | Status: AC
  Administered 2015-01-18 (×4): 30 meq
  Filled 2015-01-18 (×3): qty 30

## 2015-01-18 MED ORDER — BUDESONIDE 0.5 MG/2ML IN SUSP
0.5000 mg | Freq: Two times a day (BID) | RESPIRATORY_TRACT | Status: DC
  Administered 2015-01-18 – 2015-01-30 (×25): 0.5 mg via RESPIRATORY_TRACT
  Filled 2015-01-18 (×25): qty 2

## 2015-01-18 MED ORDER — FUROSEMIDE 10 MG/ML IJ SOLN
40.0000 mg | Freq: Three times a day (TID) | INTRAMUSCULAR | Status: DC
Start: 2015-01-18 — End: 2015-01-20
  Administered 2015-01-18 – 2015-01-20 (×6): 40 mg via INTRAVENOUS
  Filled 2015-01-18 (×6): qty 4

## 2015-01-18 MED ORDER — POTASSIUM CHLORIDE 20 MEQ/15ML (10%) PO SOLN
30.0000 meq | ORAL | Status: DC
  Administered 2015-01-18: 30 meq
  Filled 2015-01-18 (×2): qty 30

## 2015-01-18 MED ORDER — VANCOMYCIN HCL IN DEXTROSE 750-5 MG/150ML-% IV SOLN
750.0000 mg | Freq: Two times a day (BID) | INTRAVENOUS | Status: DC
  Filled 2015-01-18: qty 150

## 2015-01-18 MED ORDER — PANTOPRAZOLE SODIUM 40 MG PO PACK
40.0000 mg | PACK | Freq: Every day | ORAL | Status: DC
  Administered 2015-01-18 – 2015-01-21 (×4): 40 mg
  Filled 2015-01-18 (×5): qty 20

## 2015-01-18 MED ORDER — SODIUM PHOSPHATE 3 MMOLE/ML IV SOLN
20.0000 mmol | Freq: Once | INTRAVENOUS | Status: AC
  Administered 2015-01-18: 20 mmol via INTRAVENOUS
  Filled 2015-01-18: qty 6.67

## 2015-01-18 MED ORDER — MAGNESIUM SULFATE 2 GM/50ML IV SOLN
2.0000 g | Freq: Once | INTRAVENOUS | Status: AC
  Administered 2015-01-18: 2 g via INTRAVENOUS
  Filled 2015-01-18: qty 50

## 2015-01-18 NOTE — Progress Notes (Signed)
[facility] ADULT ICU REPLACEMENT PROTOCOL FOR AM LAB REPLACEMENT ONLY  The patient does apply for the [facility] Adult ICU Electrolyte Replacment Protocol based on the criteria listed below:   1. Is GFR >/= 40 ml/min? Yes.    Patient's GFR today is >60 2. Is urine output >/= 0.5 ml/kg/hr for the last 6 hours? Yes.   Patient's UOP is 0.72 ml/kg/hr 3. Is BUN < 60 mg/dL? Yes.    Patient's BUN today is 20 4. Abnormal electrolyte  K 3.2, Mg 1.6, phos 1.8 5. Ordered repletion with: per protocol 6. If a panic level lab has been reported, has the CCM MD in charge been notified? Yes.  .   Physician:  Vickii Penna 01/18/2015 6:35 AM

## 2015-01-18 NOTE — Progress Notes (Signed)
Nutrition Follow-up  DOCUMENTATION CODES:   Not applicable  INTERVENTION:  - Monitor for TF restart or plan to keep TF off with GOC - RD will continue to monitor for needs  NUTRITION DIAGNOSIS:   Inadequate oral intake related to inability to eat as evidenced by NPO status. 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 She is being followed at [facility] and has failed multiple rounds of chemotherapy. She is currently getting palliative chemotherapy with Gemzar started on 10/04/14. Marland Kitchen  11/4 RD notes from 11/2 and 11/3 mention need to monitor for re-feeding syndrome.   Patient is currently intubated on ventilator support MV: 6 L/min Temp (24hrs), Avg:98.5 F (36.9 C), Min:97.2 F (36.2 C), Max:99.3 F (37.4 C)  Propofol: none ml/hr  Per RN notes, pt and family have opted for TF to be d/c'ed as they continue to determine [place]. Confirmed with RN this AM. He reports MV to RD so that RD did not have to enter pt's room given pending family decisions.   Pt unable to meet needs at this time. Needs have been updated based on current Ve and Tmax. Should TF be re-started, recommend continue current order for Vital High Protein @ 55 mL/hr. This will provide 1320 kcal, 115 grams protein, and 1103 mL free water.  Medications reviewed. Labs reviewed; CBGs: 94-191 mg/dL, Ca: 8.2 mg/dL, K: 3.2 mmol/L, Phos: 1.8 mg/dL, Mg: 1.6 mg/dL.    11/3 - Tube feeding continues to be advance, not yet at goal.  - Order adjusted to reflect new estimated needs based on changes in Ve.  - Patient is currently intubated on ventilator support - MV: 8.6 L/min; Propofol: none  11/2 - Patient visited for enteral  feeding initiation/management consult.  - Husband and son in room during visit and helped to provide historical data.  - Patient has had very low appetite for the past few weeks.  - Husband estimates patient's usual intake is about 25% of needs.  - She usually eats a handful of triskets, shrimp or some drinks such as ginger ale or orange juice.  - Patient recently has started tolerating strawberry smoothies.  - Patient has been experiencing taste changes and some nausea.  - Patient has ongoing swallowing problems, with a previous history of aspiration and problems swallowing medications. Patient is positive for chronic diarrhea.  - Patient currently takes several supplements at home - gummy multivitamins, fiber, probiotic and calcium. She has tried many nutrition supplements in the past, but does not tolerate any of them.   - Per chart, patient has weight loss of 12 lbs (7% in past 6 months).  - NFPE performed - patient appears well nourished with mild fat wasting - orbital. Suspect some degree of malnutrition, but not able to diagnosis at this time.   Estimated needs calculated using [facility] 2003 (b) equation.  Patient is currently intubated on ventilator support.  MV: 8.3 L/min; Propofol: None   Diet Order:  Diet NPO time specified  Skin:  Reviewed, no issues  Last BM:  11/3  Height:   Ht Readings from Last 1 Encounters:  01/15/15 $RemoveB'5\' 2"'hegJpRRi$  (1.575 m)    Weight:   Wt Readings from Last 1  Encounters:  01/18/15 166 lb 0.1 oz (75.3 kg)    Ideal Body Weight:  50 kg  BMI:  Body mass index is 30.36 kg/(m^2).  Estimated Nutritional Needs:   Kcal:  1388  Protein:  113 grams  Fluid:  1.5L/day  EDUCATION NEEDS:   No education needs identified at this time     Jarome Matin, RD, LDN Inpatient Clinical Dietitian Pager # [phone] After hours/weekend pager # [phone]

## 2015-01-18 NOTE — Progress Notes (Signed)
Pt refused to go on and SBT. MD and RN aware.

## 2015-01-18 NOTE — Progress Notes (Addendum)
[place] for Heparin to enoxaparin Indication: History of PE on therapeutic lovenox PTA; atrial flutter  Allergies  Allergen Reactions  . Albuterol Shortness Of Breath    Tachycardia, feels like she is going to die  . Penicillins Other (See Comments)    Childhood reaction Has patient had a PCN reaction causing immediate rash, facial/tongue/throat swelling, SOB or lightheadedness with hypotension: unknown Has patient had a PCN reaction causing severe rash involving mucus membranes or skin necrosis: unknown Has patient had a PCN reaction that required hospitalization unknown Has patient had a PCN reaction occurring within the last 10 years: unknown If all of the above answers are "NO", then may proceed with Cephalosporin use.   . Albuterol Sulfate Other (See Comments)    TACHYCARDIA  . Codeine Nausea Only and Other (See Comments)    woosy  . Ibuprofen Other (See Comments)    Induces patient asthma  . Meperidine Nausea Only    Other reaction(s): Other (See Comments) "wiped out" and nausea  . Other     Unknown narcotic  . Phenobarbital Other (See Comments)    Erratic behavior, hyper, jittery  . Adhesive [Tape] Rash    Patient Measurements: Height: '5\' 2"'$  (157.5 cm) Weight: 166 lb 0.1 oz (75.3 kg) IBW/kg (Calculated) : 50.1 HEPARIN DW (KG): 65.9   Vital Signs: Temp: 98.2 F (36.8 C) (11/04 0600) Temp Source: Core (Comment) (11/04 0600) Pulse Rate: 76 (11/04 0400)  Labs:  Recent Labs  01/15/15 1424 01/15/15 1850 01/15/15 2115 01/16/15 0027 01/16/15 0645  01/17/15 0250 01/17/15 1000 01/18/15 0510  HGB 11.0*  --  10.5*  --  10.1*  --  9.1*  --  9.1*  HCT 35.9*  --  33.5*  --  31.1*  --  28.6*  --  27.9*  PLT 459*  --  380  --  387  --  387  --  385  APTT 35  --  26  --   --   --   --   --   --   LABPROT 14.2  --  14.6  --   --   --   --   --   --   INR 1.08  --  1.12  --   --   --   --   --   --   HEPARINUNFRC  --   --   --    --  0.99*  < > 0.57 0.51 0.49  CREATININE 0.59 0.91  --   --  1.17*  --  0.56  --  0.49  TROPONINI  --  0.05*  --  0.07* 0.06*  --   --   --   --   < > = values in this interval not displayed.  Estimated Creatinine Clearance: 59.5 mL/min (by C-G formula based on Cr of 0.49).   Medical History: Past Medical History  Diagnosis Date  . Asthma   . Breast pain in female     throbbing  . Abdominal pain   . Breast lump in female   . Cancer ([place])     rt  . PE (pulmonary embolism) 03/26/2014  . Neuropathy ([place])   . GERD (gastroesophageal reflux disease)   . History of hiatal hernia     Assessment: 79 y/oF with PMH of metastatic breast cancer undergoing chemotherapy at [facility], PE on chronic Lovenox for anticoagulation, and GERD who presented to [facility] ED for evaluation of possible aspiration and  SOB. Patient decompensated in ED and was intubated for respiratory failure. Patient also found to be in a-flutter.  Lovenox held on admission and pharmacy was consulted to assist with dosing of heparin infusion. Noted PTA dose of Lovenox was 50 mg SQ daily (dose was adjusted at [facility] per LMWH levels) with last dose reported as 10/31 at 2100.  Today, 01/18/2015:  CBC: Hgb/ptlc stable .  No bleeding reported.  Heparin level remains in goal range  Goal of Therapy:  Heparin level 0.3-0.7 units/ml Monitor platelets by anticoagulation protocol: Yes   Plan:   Continue heparin infusion at 800 units/hr   Daily heparin level and CBC while on heparin infusion  Monitor closely for s/s of bleeding  F/U plans to resume home Lovenox  Doreene Eland, PharmD, BCPS.   Pager: [phone] 01/18/2015 7:19 AM    Addendum:   Pharmacy asked to resume enoxaparin  Plan:  As per home dosing regimen, resume enoxaparin '50mg'$  SQ q24h\  1st dose 1h after heparin gtt turned off  This dose is based off of monitoring of LMWH anti-Xa levels at [place] at least q72h while on [place], PharmD,  BCPS.   Pager: [phone] 01/18/2015 9:34 AM

## 2015-01-18 NOTE — Progress Notes (Signed)
ANTIBIOTIC CONSULT NOTE - Follow-up  Pharmacy Consult for [name] [name], Azithromycin Indication: septic [name] [name], UTI  Allergies  Allergen Reactions  . Albuterol Shortness Of Breath    Tachycardia, feels like she is going to die  . Penicillins Other (See Comments)    Childhood reaction Has patient had a PCN reaction causing immediate rash, facial/tongue/throat swelling, SOB or lightheadedness with hypotension: unknown Has patient had a PCN reaction causing severe rash involving mucus membranes or skin necrosis: unknown Has patient had a PCN reaction that required hospitalization unknown Has patient had a PCN reaction occurring within the last 10 years: unknown If all of the above answers are "NO", then may proceed with Cephalosporin use.   . Albuterol Sulfate Other (See Comments)    TACHYCARDIA  . Codeine Nausea Only [name] Other (See Comments)    woosy  . Ibuprofen Other (See Comments)    Induces patient asthma  . Meperidine Nausea Only    Other reaction(s): Other (See Comments) "wiped out" [name] nausea  . Other     Unknown narcotic  . Phenobarbital Other (See Comments)    Erratic behavior, hyper, jittery  . Adhesive [Tape] Rash    Patient Measurements: Height: '5\' 2"'$  (157.5 cm) Weight: 166 lb 0.1 oz (75.3 kg) IBW/kg (Calculated) : 50.1  Vital Signs: Temp: 98.2 F (36.8 C) (11/04 0600) Temp Source: Core (Comment) (11/04 0600) Pulse Rate: 76 (11/04 0400) Intake/Output from previous day: 11/03 0701 - 11/04 0700 In: 2706.1 [I.V.:1598.6; NG/GT:707.5; IV Piggyback:350] Out: 2235 [Urine:2235] Intake/Output from this shift:    Labs:  Recent Labs  01/16/15 0645 01/17/15 0250 01/18/15 0510  WBC 12.7* 10.2 10.7*  HGB 10.1* 9.1* 9.1*  PLT 387 387 385  CREATININE 1.17* 0.56 0.49   Estimated Creatinine Clearance: 59.5 mL/min (by C-G formula based on Cr of 0.49). No results for input(s): VANCOTROUGH, VANCOPEAK, VANCORANDOM, GENTTROUGH, GENTPEAK,  GENTRANDOM, TOBRATROUGH, TOBRAPEAK, TOBRARND, AMIKACINPEAK, AMIKACINTROU, AMIKACIN in the last 72 hours.   Microbiology: Recent Results (from the past 720 hour(s))  Culture, blood (routine x 2)     Status: None (Preliminary result)   Collection Time: 01/15/15  7:00 PM  Result Value Ref Range Status   Specimen Description BLOOD CENTRAL LINE  Final   Special Requests BOTTLES DRAWN AEROBIC [name] ANAEROBIC 10 CC EACH  Final   Culture   Final    NO GROWTH 2 DAYS Performed at [facility]    Report Status PENDING  Incomplete  MRSA PCR Screening     Status: None   Collection Time: 01/15/15  8:27 PM  Result Value Ref Range Status   MRSA by PCR NEGATIVE NEGATIVE Final    Comment:        The GeneXpert MRSA Assay (FDA approved for NASAL specimens only), is one component of a comprehensive MRSA colonization surveillance program. It is not intended to diagnose MRSA infection nor to guide or monitor treatment for MRSA infections.   Culture, blood (routine x 2)     Status: None (Preliminary result)   Collection Time: 01/15/15  9:04 PM  Result Value Ref Range Status   Specimen Description BLOOD LEFT HAND  Final   Special Requests IN PEDIATRIC BOTTLE 2.5 ML  Final   Culture   Final    NO GROWTH 2 DAYS Performed at [name]    Report Status PENDING  Incomplete  Urine culture     Status: None (Preliminary result)   Collection Time: 01/16/15  5:38 AM  Result  Value Ref Range Status   Specimen Description URINE, CATHETERIZED  Final   Special Requests Immunocompromised  Final   Culture   Final    CULTURE REINCUBATED FOR BETTER GROWTH Performed at [facility]    Report Status PENDING  Incomplete  Culture, respiratory (tracheal aspirate)     Status: None (Preliminary result)   Collection Time: 01/16/15 11:44 AM  Result Value Ref Range Status   Specimen Description TRACHEAL ASPIRATE  Final   Special Requests Immunocompromised  Final   Gram Stain   Final    FEW WBC  PRESENT, PREDOMINANTLY MONONUCLEAR RARE SQUAMOUS EPITHELIAL CELLS PRESENT NO ORGANISMS SEEN Performed at [facility]    Culture PENDING  Incomplete   Report Status PENDING  Incomplete    [name] [name] [name], [name] [name] [name], [name] [name] [name], [name] UTI.  11/1 >> [name] >>  11/1 >> [name] [name] >>  11/1 >> Azithromycin >> 11/2   11/1 blood x 2: NGTD  11/1 urine: re-incubated - note: sent 11/2 (after abx started)  11/2  tracheal aspirate: pending - note: sent 11/2 (after abx started)  WBC mildly elevated on steroids Renal: SCr improved from admission, good UOP Fever resolved  Goal of Therapy:  [name] trough level 15-20 mcg/ml  Appropriate antibiotic dosing for renal function [name] indication Eradication of infection  Plan:  Day #3 antibiotics  Continue [name] '750mg'$  IV q12h  [name] trough level at steady state.  Await plan of care prior to ordering trough, RN's note from overnight states patient [name] husband deciding on aggressiveness of care they wish to receive  Continue [name] [name] 2g IV q8h  Monitor renal function, cultures, clinical course.  Doreene Eland, PharmD, BCPS.   Pager: [phone] 01/18/2015 7:26 AM

## 2015-01-18 NOTE — Progress Notes (Signed)
eLink Physician-Brief Progress Note Patient Name: ELEXIA FRIEDT DOB: 02-07-1941 MRN: [id number]   Date of Service  01/18/2015  HPI/Events of Note  Nausea and vomiting. No relief with Zofran IV.  eICU Interventions  Will add Phenergan 12.5 mg IV Q 6 hours PRN N/V.     Intervention Category Intermediate Interventions: Other:  Lysle Dingwall 01/18/2015, 7:39 PM

## 2015-01-18 NOTE — Progress Notes (Signed)
This RN went into patient room to change empty tube feeding bottle.  Pt and husband both refused any further feedings until they decide if they will continue with aggressive care or not.

## 2015-01-18 NOTE — Progress Notes (Signed)
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 She has failed multiple rounds of chemotherapy and is currently getting palliative chemo. Also with h/o PE on chronic anticoagulation with Lovenox. H/o influenza, HCAP and postobstructive PNA 06/2014.  Admitted to [facility] 01/15/15 for acute respiratory failure in the setting of aspiration, sepsis and new atrial flutter w/ RVR.  Subjective: Remains Intubated. Husband by bedside. Per RN, no events overnight.  Objective: Vital signs in last 24 hours: Temp:  [97.2 F (36.2 C)-99.3 F (37.4 C)] 98.2 F (36.8 C) (11/04 0600) Pulse Rate:  [76-93] 76 (11/04 0400) Resp:  [12-27] 20 (11/04 0600) BP: (88-110)/(50-62) 88/50 mmHg (11/03 1744) SpO2:  [99 %-100 %] 99 % (11/04 0600) FiO2 (%):  [40 %] 40 % (11/04 0828) Weight:  [166 lb 0.1 oz (75.3 kg)] 166 lb 0.1 oz (75.3 kg) (11/04 0500) Last BM Date: 01/17/15  Intake/Output from previous day: 11/03 0701 - 11/04 0700 In: 2740.8 [I.V.:1633.3; NG/GT:707.5; IV Piggyback:350] Out: 2235 [Urine:2235] Intake/Output this shift: Total I/O In: 34.7 [I.V.:34.7] Out: 80 [Urine:80]  Medications Current Facility-Administered Medications  Medication Dose Route Frequency Provider Last Rate Last Dose  . 0.9 %  sodium chloride infusion  1,000 mL Intravenous Continuous Dorie Rank, MD 20 mL/hr at 01/15/15 1428 1,000 mL at 01/15/15 1428  . acetaminophen (TYLENOL) tablet 1,000 mg  1,000 mg Per Tube Q6H PRN Colbert Coyer, MD   1,000 mg at 01/17/15 1643  . amiodarone (NEXTERONE PREMIX) 360 MG/200ML (1.8 mg/mL) IV infusion  30 mg/hr Intravenous Continuous Dorie Rank, MD 16.7 mL/hr at 01/18/15 0908 30 mg/hr at 01/18/15 0908  . antiseptic oral rinse solution (CORINZ)  7 mL Mouth Rinse QID Juanito Doom, MD   7 mL at 01/18/15 0400  . aztreonam (AZACTAM) 2 g in dextrose 5 % 50 mL IVPB  2 g Intravenous Q8H  Praveen Mannam, MD   2 g at 01/18/15 0549  . budesonide (PULMICORT) nebulizer solution 0.5 mg  0.5 mg Nebulization BID Erick Colace, NP      . chlorhexidine gluconate (PERIDEX) 0.12 % solution 15 mL  15 mL Mouth Rinse BID Juanito Doom, MD   15 mL at 01/18/15 0815  . feeding supplement (VITAL HIGH PROTEIN) liquid 1,000 mL  1,000 mL Per Tube Continuous Clayton Bibles, RD   Stopped at 01/18/15 0025  . fentaNYL (SUBLIMAZE) 2,500 mcg in sodium chloride 0.9 % 250 mL (10 mcg/mL) infusion  50-100 mcg/hr Intravenous Titrated Juanito Doom, MD   Stopped at 01/18/15 [phone]  . fentaNYL (SUBLIMAZE) injection 100 mcg  100 mcg Intravenous Q2H PRN Dorie Rank, MD   100 mcg at 01/16/15 1225  . furosemide (LASIX) injection 40 mg  40 mg Intravenous 3 times per day Erick Colace, NP      . heparin ADULT infusion 100 units/mL (25000 units/250 mL)  800 Units/hr Intravenous Continuous Praveen Mannam, MD 8 mL/hr at 01/18/15 0908 800 Units/hr at 01/18/15 0908  . hydrocortisone sodium succinate (SOLU-CORTEF) 100 MG injection 100 mg  100 mg Intravenous Q12H Erick Colace, NP      . insulin aspart (novoLOG) injection 2-6 Units  2-6 Units Subcutaneous 6 times per day Flora Lipps, MD   2 Units at 01/18/15 0815  . ipratropium (ATROVENT) nebulizer solution 0.5 mg  0.5 mg Nebulization Q6H Erick Colace, NP      . magic mouthwash  5 mL Oral Q6H PRN Collier Salina  Starleen Arms, NP      . nystatin (MYCOSTATIN) 100000 UNIT/ML suspension 500,000 Units  5 mL Oral QID Erick Colace, NP   500,000 Units at 01/16/15 1400  . ondansetron (ZOFRAN) injection 4 mg  4 mg Intravenous Q6H PRN Anders Simmonds, MD   4 mg at 01/18/15 0620  . pantoprazole sodium (PROTONIX) 40 mg/20 mL oral suspension 40 mg  40 mg Per Tube Q1200 Erick Colace, NP      . phenylephrine (NEO-SYNEPHRINE) 40 mg in dextrose 5 % 250 mL (0.16 mg/mL) infusion  30-200 mcg/min Intravenous Continuous Marshell Garfinkel, MD   Stopped at 01/18/15 0347  . potassium chloride 20 MEQ/15ML  (10%) solution 30 mEq  30 mEq Per Tube Q4H Erick Colace, NP      . sodium chloride 0.9 % bolus 1,000 mL  1,000 mL Intravenous Once Praveen Mannam, MD      . sodium phosphate 20 mmol in sodium chloride 0.9 % 250 mL infusion  20 mmol Intravenous Once Javier Glazier, MD   20 mmol at 01/18/15 0816    PE: General appearance: alert, cooperative, no distress and intubated Neck: no carotid bruit and no JVD Lungs: Intubated. CTAB Heart: regular rate and rhythm Extremities: no LEE Pulses: 2+ and symmetric Skin: warm and dry Neurologic: Grossly normal  Lab Results:   Recent Labs  01/16/15 0645 01/17/15 0250 01/18/15 0510  WBC 12.7* 10.2 10.7*  HGB 10.1* 9.1* 9.1*  HCT 31.1* 28.6* 27.9*  PLT 387 387 385   BMET  Recent Labs  01/16/15 0645 01/17/15 0250 01/18/15 0510  NA 122* 130* 135  K 4.4 3.7 3.2*  CL 85* 92* 91*  CO2 25 31 35*  GLUCOSE 216* 161* 111*  BUN $Re'17 16 20  'sJO$ CREATININE 1.17* 0.56 0.49  CALCIUM 7.8* 8.3* 8.2*   PT/INR  Recent Labs  01/15/15 1424 01/15/15 2115  LABPROT 14.2 14.6  INR 1.08 1.12   Cardiac Panel (last 3 results)  Recent Labs  01/15/15 1850 01/16/15 0027 01/16/15 0645  TROPONINI 0.05* 0.07* 0.06*    Studies/Results: Study Conclusions  - Left ventricle: The cavity size was normal. Wall thickness was normal. Systolic function was normal. The estimated ejection fraction was in the range of 55% to 60%. Wall motion was normal; there were no regional wall motion abnormalities. - Mitral valve: There was mild regurgitation. - Pulmonary arteries: Systolic pressure was mildly to moderately increased. PA peak pressure: 39 mm Hg (S).  Pericardium: There was no pericardial effusion.   Assessment/Plan  Active Problems:   Shock ([place])   Respiratory failure (HCC)   Atrial flutter with rapid ventricular response (HCC)   Acute respiratory failure with hypoxia (HCC)   Acute diastolic CHF (congestive heart failure) ([place])  1. New  onset atrial flutter of unknown duration with RVR: In the setting of underlying pulmonary process. TSH normal. 2D echo with normal LVF and no effusion.  Successful conversion back to NSR on IV amiodarone. Maintaining NSR with PACs. HR controlled in the 80s. Currently on IV heparin and changing back to Lovenox for h/o PE.    2. Acute respiratory failure secondary to aspiration in setting of significant metastatic breast CA with pleural effusions: Remains intubated. No significant pulmonary edema noted on chest xray at time of admit. 2D echo with normal LVEF of 55-60% with normal wall motion. No pericardial effusion.   3. 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 Per spouse, she has been declining  rapidly over the past few weeks with weakness, malaise and poor PO intake.  4. Hypotension with shock: ? Secondary to acute respiratory failure with hypoxia and aspiration, sepsis from UTI, dehydration and rapid atrial flutter. Now off pressors.  5. History of PE with DVT on SQ Lovenox: restarting Lovenox  5. History of influenza, HCAP and postobstructive PNA 06/2014   LOS: 3 days    LUKE KILROY PA-C 01/18/2015 9:25 AM  Patient seen and examined with Kerin Ransom, PA. We discussed all aspects of the encounter. I agree with the assessment and plan as stated above.  Continue current management for now.  Discussed with Pulmonary.  Continues to fail PSV trial.  Will given more diuretics today.  Concern for spread of CA into pleural space resulting in both an obstructive and restrictive element preventing extubation.  Will follow at a distance. Please call with any questions over the weekend.  Signed: Fransico Him, MD [facility] 01/18/2015

## 2015-01-18 NOTE — Progress Notes (Signed)
Date: January 18, 2015 Chart reviewed for concurrent status and case management needs. Will continue to follow patient for changes and needs: Intubated Velva Harman, RN, BSN, [place]   [phone]

## 2015-01-18 NOTE — Progress Notes (Signed)
PULMONARY / CRITICAL CARE MEDICINE   Name: [name] [name] MRN: [id number] DOB: 1940-12-24    ADMISSION DATE:  01/15/2015 CONSULTATION DATE:  01/15/15  REFERRING MD :  ED  CHIEF COMPLAINT:  Aspiration, vent dependent respiratory failure, sepsis, atrial flutter.  INITIAL PRESENTATION:  [name] [name] is a 74 year old with metastatic breast cancer being followed at [facility]. Patient was at home drinking orange juice when she began coughing and felt short of breath. 911 was called and she was brought to the ED and intubated for respiratory failure. It was noted that her heart reported in the 180s, atrial flutter. She did not respond to vagal maneuvers or adenosine. Started on Levophed for hypotension.  STUDIES:  CXR 11/1 >> Progressive metastatic disease, B/L pleural effusions.  SIGNIFICANT EVENTS: 11/1 Admit, intubated for respiratory failure aspiration. In AF w/ RVR and shock. Placed on amio gtt, heparin, levophed and neo. CVL placed to guide resuscitation. Abx started.  11/2 pressors weaned to off. Now NSR. Failed SBT and was short of breath on higher levels of PSV. KVO'd IVFs, send urine osmo. Tubefeeds started.  11/2 Echocardiogram> LVEF 55-60%, PASP 39 mmHg 11/3: started diuresing  1/4: still failing PSV trials. Wheezing. "allergic to SABA". Added ICS. Cont lasix-->escalated as did not achieve negative balance. Patient wanting to have repeat MRI/CT scan to help her decide on treatment going forward. We advised to get her off vent if possible first. Changed ABX to mono-coverage w/ Levaquin  SUBJECTIVE:  Increased accessory muscle use and increased WOB on PSV.    VITAL SIGNS: Temp:  [97.2 F (36.2 C)-99.3 F (37.4 C)] 98.2 F (36.8 C) (11/04 0600) Pulse Rate:  [76-93] 76 (11/04 0400) Resp:  [12-27] 20 (11/04 0600) BP: (88-110)/(50-62) 88/50 mmHg (11/03 1744) SpO2:  [99 %-100 %] 99 % (11/04 0600) FiO2 (%):  [40 %] 40 % (11/04 0828) Weight:  [75.3 kg (166 lb 0.1 oz)] 75.3 kg (166  lb 0.1 oz) (11/04 0500) HEMODYNAMICS: CVP:  [12 mmHg-15 mmHg] 15 mmHg VENTILATOR SETTINGS: Vent Mode:  [-] PRVC FiO2 (%):  [40 %] 40 % Set Rate:  [20 bmp] 20 bmp Vt Set:  [400 mL] 400 mL PEEP:  [5 cmH20] 5 cmH20 Plateau Pressure:  [32 cmH20-36 cmH20] 36 cmH20 INTAKE / OUTPUT:  Intake/Output Summary (Last 24 hours) at 01/18/15 0852 Last data filed at 01/18/15 0819  Gross per 24 hour  Intake 2676.37 ml  Output   1965 ml  Net 711.37 ml    PHYSICAL EXAMINATION: General:  Awake on vent, comfortable-->but increased accessory muscle use and paradoxical efforts when on PSV HEENT: NCAT, ETT in place PULM: diminished R base, shallow, w/ exp wheeze. + accessory muscle use  CV: RRR, no mgr GI: BS+, soft, nontender MSK: diminished tone Derm: bruises extensor surfaces, ecchymosis Neuro: A&Ox4, maew  LABS:  CBC  Recent Labs Lab 01/16/15 0645 01/17/15 0250 01/18/15 0510  WBC 12.7* 10.2 10.7*  HGB 10.1* 9.1* 9.1*  HCT 31.1* 28.6* 27.9*  PLT 387 387 385   Coag's  Recent Labs Lab 01/15/15 1424 01/15/15 2115  APTT 35 26  INR 1.08 1.12   BMET  Recent Labs Lab 01/16/15 0645 01/17/15 0250 01/18/15 0510  NA 122* 130* 135  K 4.4 3.7 3.2*  CL 85* 92* 91*  CO2 25 31 35*  BUN '17 16 20  '$ CREATININE 1.17* 0.56 0.49  GLUCOSE 216* 161* 111*   Electrolytes  Recent Labs Lab 01/15/15 1850 01/16/15 0645 01/17/15 0250 01/18/15 0510  CALCIUM 8.6*  7.8* 8.3* 8.2*  MG 1.5* 1.4*  --  1.6*  PHOS 5.4* 3.8  --  1.8*   Sepsis Markers  Recent Labs Lab 01/15/15 1850 01/15/15 1921  LATICACIDVEN 4.5* 4.97*  PROCALCITON 0.28  --    ABG  Recent Labs Lab 01/15/15 1851 01/16/15 0350  PHART 7.392 7.410  PCO2ART 47.1* 41.0  PO2ART 368* 154*   Liver Enzymes  Recent Labs Lab 01/15/15 1424 01/15/15 1850  AST 20 24  ALT 16 16  ALKPHOS 107 99  BILITOT 0.4 0.8  ALBUMIN 3.2* 2.8*   Cardiac Enzymes  Recent Labs Lab 01/15/15 1850 01/16/15 0027 01/16/15 0645   TROPONINI 0.05* 0.07* 0.06*   Glucose  Recent Labs Lab 01/17/15 1135 01/17/15 1548 01/17/15 1946 01/17/15 2356 01/18/15 0345 01/18/15 0757  GLUCAP 191* 174* 94 170* 111* 128*    Imaging 01/17/2015 CXR > babasilar infiltrates OK, loculated effusion R, essentially unchanged  ASSESSMENT / PLAN: PULMONARY OETT 11/1>> A: Vent dependent respiratory failure due to Aspiration pneumonia/HCAP 11/3>likely pulmonary edema Pulmonary embolus 1/16 Probable malignant right effusion (loculated) Element of obstructive lung disease (states h/o asthma) She continues to do poorly on PSV trials. She seems to have an element of restrictive lung disease in addition to her obstructive component. CXR w/ out sig change. Does have decreased aeration in the bases.  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 Hypokalemia  AKI following hypotension-->resolved  P:   Monitor BMET and UOP Replace electrolytes as needed  GASTROINTESTINAL A:   Protein calorie malnutrition P:   Continue tubefeeds Cont PPI  HEMATOLOGIC/ONCOLOGY A:   Metastatic breast cancer-->progressive w/ wide spread metastasis -->concerned about lymphangitic spread  P:  Update [facility] on major changes Cont current rx    INFECTIOUS A:   Septic shock due to HCAP (shock resolved) P:   BCx2 11/1 >> Sputum 11/1>>>  Abx:  Vancomycin, start date 11/1, >>>11/4 Aztreonam, start date 11/1, >>>11/4 Azithromycin, start date 11/1, >>>11/2 levaquin 11/4>>>  ENDOCRINE A:   Relative adrenal insufficiency, on chronic steroids P:   Continue stress  dose steroids w/ slow taper    NEUROLOGIC A:  Very sensitive to sedation Pain related to cancer P:   RASS goal: -1 PAD protocol with PRN fent D/c versed   FAMILY  - Updates: Husband updated at bedside 11/3  - Inter-disciplinary family meet or Palliative Care meeting due by: 11/8  Continues to fail PSV trials. Increased work of breathing on PSV. Otherwise everything else looks a little better. Her CXR shows some decreased aeration today, she is also wheezing. We will add ICS, escalate diuretics and cont weaning efforts. It appears as though there is both a restrictive and obstructive element preventing [place] from extubating. We are concerned that this may be in part d/t spread of the cancer to the pleural space. Pt is appropriately concerned about the big picture. Is asking to do MRI and CT here. We would ideally like to get her off the vent first if possible.   Erick Colace ACNP-BC [place] Pager # [phone] OR # [phone] if no answer  Attending:  I have seen and examined  the patient with nurse practitioner/resident and agree with the note above.   Failed SBT this morning  On exam: wheezing with vent supported breaths, CV exam within normal limits as rrr, no JVD; skin with edema, belly soft, BS+  CXR basically uncahnged, bibasilar atelectasis vs interstitial changes with loculated effusions, ETT in place  Acute on chronic respiratory failure: I had a lengthy conversation with her and her husband today.  I explained that we should try to focus our care on the pneumonia, possible pulmonary edema, and today's wheezing.  We will continue diuresis and add pulmicort and ipratropium (no albuterol due to allergy).  However at baseline she is very weak and has chronic shortness of breath.  If she does not make improvement with these measures in the next 1-2 days then we need to move forward with a palliatiave extubation.  She is OK with this.    Breast cancer: She  asked for repeat imaging of her chest and brain because if it looks like the chemotherapy is not working then she wants to proceed with palliation now.  I explained that this would be difficult as all her recent CT's and MRI's were performed at [facility].  Will hold off for now  Aspiration pneumonia> continue levaquin as ordered  Nausea> add decadron  My cc time 40 minutes  Roselie Awkward, MD [place] [facility] Pager: [phone] Cell: [phone] After 3pm or if no response, call [phone]

## 2015-01-19 ENCOUNTER — Inpatient Hospital Stay (HOSPITAL_COMMUNITY): Payer: Medicare Other

## 2015-01-19 LAB — COMPREHENSIVE METABOLIC PANEL
ALBUMIN: 2.2 g/dL — AB (ref 3.5–5.0)
ALT: 11 U/L — ABNORMAL LOW (ref 14–54)
AST: 17 U/L (ref 15–41)
Alkaline Phosphatase: 92 U/L (ref 38–126)
Anion gap: 7 (ref 5–15)
BUN: 22 mg/dL — AB (ref 6–20)
CHLORIDE: 96 mmol/L — AB (ref 101–111)
CO2: 36 mmol/L — ABNORMAL HIGH (ref 22–32)
Calcium: 8.4 mg/dL — ABNORMAL LOW (ref 8.9–10.3)
Creatinine, Ser: 0.54 mg/dL (ref 0.44–1.00)
GFR calc Af Amer: >60 mL/min (ref >60)
GFR calc non Af Amer: >60 mL/min (ref >60)
GLUCOSE: 133 mg/dL — AB (ref 65–99)
POTASSIUM: 4.2 mmol/L (ref 3.5–5.1)
SODIUM: 139 mmol/L (ref 135–145)
Total Bilirubin: 0.7 mg/dL (ref 0.3–1.2)
Total Protein: 5.2 g/dL — ABNORMAL LOW (ref 6.5–8.1)

## 2015-01-19 LAB — GLUCOSE, CAPILLARY
GLUCOSE-CAPILLARY: 134 mg/dL — AB (ref 65–99)
GLUCOSE-CAPILLARY: 136 mg/dL — AB (ref 65–99)
Glucose-Capillary: 136 mg/dL — ABNORMAL HIGH (ref 65–99)
Glucose-Capillary: 138 mg/dL — ABNORMAL HIGH (ref 65–99)
Glucose-Capillary: 177 mg/dL — ABNORMAL HIGH (ref 65–99)
Glucose-Capillary: 115 mg/dL — ABNORMAL HIGH (ref 65–99)

## 2015-01-19 LAB — CULTURE, RESPIRATORY W GRAM STAIN

## 2015-01-19 LAB — CBC
HEMATOCRIT: 30.1 % — AB (ref 36.0–46.0)
Hemoglobin: 9.5 g/dL — ABNORMAL LOW (ref 12.0–15.0)
MCH: 30.5 pg (ref 26.0–34.0)
MCHC: 31.6 g/dL (ref 30.0–36.0)
MCV: 96.8 fL (ref 78.0–100.0)
PLATELETS: 414 10*3/uL — AB (ref 150–400)
RBC: 3.11 MIL/uL — ABNORMAL LOW (ref 3.87–5.11)
RDW: 21.1 % — AB (ref 11.5–15.5)
WBC: 9.7 10*3/uL (ref 4.0–10.5)

## 2015-01-19 LAB — CULTURE, RESPIRATORY

## 2015-01-19 LAB — PHOSPHORUS: PHOSPHORUS: 1.7 mg/dL — AB (ref 2.5–4.6)

## 2015-01-19 LAB — MAGNESIUM: MAGNESIUM: 1.8 mg/dL (ref 1.7–2.4)

## 2015-01-19 MED ORDER — LEVALBUTEROL HCL 0.63 MG/3ML IN NEBU
0.6300 mg | INHALATION_SOLUTION | Freq: Four times a day (QID) | RESPIRATORY_TRACT | Status: DC
  Administered 2015-01-19 – 2015-01-30 (×44): 0.63 mg via RESPIRATORY_TRACT
  Filled 2015-01-19 (×43): qty 3

## 2015-01-19 MED ORDER — SENNOSIDES 8.8 MG/5ML PO SYRP
5.0000 mL | ORAL_SOLUTION | Freq: Two times a day (BID) | ORAL | Status: DC | PRN
  Filled 2015-01-19: qty 5

## 2015-01-19 MED ORDER — BISACODYL 10 MG RE SUPP: 10.0000 mg | Freq: Every day | RECTAL | Status: DC | PRN

## 2015-01-19 MED ORDER — FENTANYL BOLUS VIA INFUSION
25.0000 ug | INTRAVENOUS | Status: DC | PRN
  Administered 2015-01-22 – 2015-01-29 (×14): 25 ug via INTRAVENOUS
  Filled 2015-01-19: qty 25

## 2015-01-19 MED ORDER — CHLORHEXIDINE GLUCONATE 0.12 % MT SOLN
OROMUCOSAL | Status: AC
  Administered 2015-01-19: 15 mL
  Filled 2015-01-19: qty 15

## 2015-01-19 MED ORDER — FENTANYL CITRATE (PF) 2500 MCG/50ML IJ SOLN
25.0000 ug/h | INTRAMUSCULAR | Status: AC
  Administered 2015-01-19: 25 ug/h via INTRAVENOUS
  Administered 2015-01-21: 50 ug/h via INTRAVENOUS
  Administered 2015-01-23: 25 ug/h via INTRAVENOUS
  Administered 2015-01-25: 100 ug/h via INTRAVENOUS
  Administered 2015-01-26: 50 ug/h via INTRAVENOUS
  Administered 2015-01-26: 25 ug/h via INTRAVENOUS
  Filled 2015-01-19 (×8): qty 50

## 2015-01-19 NOTE — Care Management Important Message (Addendum)
Important Message  Patient Details  Name: [name] [name] MRN: [id number] Date of Birth: 1941-03-13   Medicare Important Message Given:  Yes-second notification given    Erenest Rasher, RN 01/19/2015, 10:58 AM

## 2015-01-19 NOTE — Progress Notes (Signed)
Patient ID: [name] [name], female   DOB: 1940-05-12, 74 y.o.   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 She has failed multiple rounds of chemotherapy and is currently getting palliative chemo. Also with h/o PE on chronic anticoagulation with Lovenox. H/o influenza, HCAP and postobstructive PNA 06/2014.  Admitted to [facility] 01/15/15 for acute respiratory failure in the setting of aspiration, sepsis and new atrial flutter w/ RVR.  Subjective: Remains Intubated. Husband by bedside. Per RN, no events overnight.  Objective: Vital signs in last 24 hours: Temp:  [97.2 F (36.2 C)-98.6 F (37 C)] 98.4 F (36.9 C) (11/05 0600) Resp:  [11-19] 15 (11/05 0600) SpO2:  [84 %-100 %] 100 % (11/05 0600) FiO2 (%):  [40 %] 40 % (11/05 0600) Weight:  [75.4 kg (166 lb 3.6 oz)] 75.4 kg (166 lb 3.6 oz) (11/05 0500) Last BM Date: 01/17/15  Intake/Output from previous day: 11/04 0701 - 11/05 0700 In: 1218 [I.V.:665.4; NG/GT:452.7] Out: 2075 [Urine:2075] Intake/Output this shift:    Medications Current Facility-Administered Medications  Medication Dose Route Frequency Provider Last Rate Last Dose  . 0.9 %  sodium chloride infusion  1,000 mL Intravenous Continuous Dorie Rank, MD 20 mL/hr at 01/15/15 1428 1,000 mL at 01/15/15 1428  . acetaminophen (TYLENOL) tablet 1,000 mg  1,000 mg Per Tube Q6H PRN Colbert Coyer, MD   1,000 mg at 01/17/15 1643  . amiodarone (NEXTERONE PREMIX) 360 MG/200ML (1.8 mg/mL) IV infusion  30 mg/hr Intravenous Continuous Dorie Rank, MD 16.7 mL/hr at 01/19/15 0755 30 mg/hr at 01/19/15 0755  . antiseptic oral rinse solution (CORINZ)  7 mL Mouth Rinse QID Juanito Doom, MD   7 mL at 01/18/15 1715  . budesonide (PULMICORT) nebulizer solution 0.5 mg  0.5 mg Nebulization BID Erick Colace, NP   0.5 mg at 01/19/15 0751  . chlorhexidine  gluconate (PERIDEX) 0.12 % solution 15 mL  15 mL Mouth Rinse BID Juanito Doom, MD   15 mL at 01/19/15 0756  . enoxaparin (LOVENOX) injection 50 mg  50 mg Subcutaneous Daily Berton Mount, RPH   50 mg at 01/18/15 1049  . feeding supplement (VITAL HIGH PROTEIN) liquid 1,000 mL  1,000 mL Per Tube Continuous Clayton Bibles, RD 40 mL/hr at 01/19/15 0600 1,000 mL at 01/19/15 0600  . fentaNYL (SUBLIMAZE) 2,500 mcg in sodium chloride 0.9 % 250 mL (10 mcg/mL) infusion  50-100 mcg/hr Intravenous Titrated Juanito Doom, MD 5 mL/hr at 01/19/15 0600 50 mcg/hr at 01/19/15 0600  . fentaNYL (SUBLIMAZE) injection 100 mcg  100 mcg Intravenous Q2H PRN Dorie Rank, MD   100 mcg at 01/16/15 1225  . furosemide (LASIX) injection 40 mg  40 mg Intravenous 3 times per day Erick Colace, NP   40 mg at 01/19/15 0610  . hydrocortisone sodium succinate (SOLU-CORTEF) 100 MG injection 100 mg  100 mg Intravenous Q12H Erick Colace, NP   100 mg at 01/19/15 0609  . insulin aspart (novoLOG) injection 2-6 Units  2-6 Units Subcutaneous 6 times per day Flora Lipps, MD   2 Units at 01/19/15 0500  . ipratropium (ATROVENT) nebulizer solution 0.5 mg  0.5 mg Nebulization Q6H Erick Colace, NP   0.5 mg at 01/19/15 0751  . levofloxacin (LEVAQUIN) IVPB 750 mg  750 mg Intravenous Q24H Erick Colace, NP   750 mg at 01/18/15 1247  . magic mouthwash  5 mL Oral Q6H PRN Erick Colace, NP      . nystatin (MYCOSTATIN) 100000 UNIT/ML suspension 500,000 Units  5 mL Oral QID Erick Colace, NP   500,000 Units at 01/16/15 1400  . ondansetron (ZOFRAN) injection 4 mg  4 mg Intravenous Q6H PRN Anders Simmonds, MD   4 mg at 01/18/15 1716  . pantoprazole sodium (PROTONIX) 40 mg/20 mL oral suspension 40 mg  40 mg Per Tube Q1200 Erick Colace, NP   40 mg at 01/18/15 1245  . phenylephrine (NEO-SYNEPHRINE) 40 mg in dextrose 5 % 250 mL (0.16 mg/mL) infusion  30-200 mcg/min Intravenous Continuous Marshell Garfinkel, MD   Stopped at 01/18/15 0347  .  promethazine (PHENERGAN) injection 12.5 mg  12.5 mg Intravenous Q6H PRN Anders Simmonds, MD      . sodium chloride 0.9 % bolus 1,000 mL  1,000 mL Intravenous Once Praveen Mannam, MD        PE: General appearance: alert, cooperative, no distress and intubated Neck: no carotid bruit and no JVD Lungs: Intubated. CTAB Heart: regular rate and rhythm Extremities: no LEE Pulses: 2+ and symmetric Skin: warm and dry Neurologic: Grossly normal  Lab Results:   Recent Labs  01/17/15 0250 01/18/15 0510 01/19/15 0450  WBC 10.2 10.7* 9.7  HGB 9.1* 9.1* 9.5*  HCT 28.6* 27.9* 30.1*  PLT 387 385 414*   BMET  Recent Labs  01/17/15 0250 01/18/15 0510 01/19/15 0450  NA 130* 135 139  K 3.7 3.2* 4.2  CL 92* 91* 96*  CO2 31 35* 36*  GLUCOSE 161* 111* 133*  BUN 16 20 22*  CREATININE 0.56 0.49 0.54  CALCIUM 8.3* 8.2* 8.4*   PT/INR No results for input(s): LABPROT, INR in the last 72 hours. Cardiac Panel (last 3 results) No results for input(s): CKTOTAL, CKMB, TROPONINI, RELINDX in the last 72 hours.  Studies/Results: Study Conclusions  - Left ventricle: The cavity size was normal. Wall thickness was normal. Systolic function was normal. The estimated ejection fraction was in the range of 55% to 60%. Wall motion was normal; there were no regional wall motion abnormalities. - Mitral valve: There was mild regurgitation. - Pulmonary arteries: Systolic pressure was mildly to moderately increased. PA peak pressure: 39 mm Hg (S).  Pericardium: There was no pericardial effusion.   Assessment/Plan  Active Problems:   Shock ([place])   Respiratory failure (HCC)   Atrial flutter with rapid ventricular response (HCC)   Acute respiratory failure with hypoxia (HCC)   Acute diastolic CHF (congestive heart failure) (HCC)   Acute respiratory failure with hypoxemia (HCC)   Aspiration pneumonia (HCC)   Metastatic breast cancer ([place])  1. New onset atrial flutter of unknown duration  with RVR: Maint NSR continue current Rx     2. Acute respiratory failure secondary to aspiration in setting of significant metastatic breast CA with pleural effusions: Remains intubated. No significant pulmonary edema noted on chest xray at time of admit. 2D echo with normal LVEF of 55-60% with normal wall motion. No pericardial effusion. Currently weaning with good sats   3. 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 Per spouse, she has been declining rapidly over the past few weeks with weakness, malaise and poor PO intake.  4. Hypotension with shock: ? Secondary to acute respiratory failure with hypoxia and aspiration, sepsis from UTI, dehydration and rapid atrial flutter. Now off pressors.  5. History of PE with DVT on SQ Lovenox: restarting Lovenox  5. History of influenza, HCAP and postobstructive PNA 06/2014   LOS: 4 days   Will sign off call if flutter returns  Discussed with husband  Jenkins Rouge

## 2015-01-19 NOTE — Progress Notes (Signed)
PULMONARY / CRITICAL CARE MEDICINE   Name: [name] BUFKIN MRN: [id number] DOB: 1940-11-24    ADMISSION DATE:  01/15/2015 CONSULTATION DATE:  01/15/15  REFERRING MD :  ED  CHIEF COMPLAINT:  Aspiration, vent dependent respiratory failure, sepsis, atrial flutter.  INITIAL PRESENTATION: 74 yo female presented with respiratory failure, a flutter with RVR.  Has hx of Stage IV breast cancer.   STUDIES:  11/02 Echo >> EF 55 to 60%  SIGNIFICANT EVENTS: 11/1 Admit, amio/heparin gtt, phenylephrine, abx 11/2 off pressors, sinus rhythm 11/3 start lasix 11/4 wheezing >> added ICS 11/5 d/c amiodarone, cardiology sign off  SUBJECTIVE:  Denies chest pain.  On pressure support 20/5.  VITAL SIGNS: Temp:  [97.2 F (36.2 C)-98.6 F (37 C)] 98.1 F (36.7 C) (11/05 0900) Resp:  [11-26] 15 (11/05 0900) SpO2:  [10 %-100 %] 100 % (11/05 0900) FiO2 (%):  [40 %] 40 % (11/05 0804) Weight:  [166 lb 3.6 oz (75.4 kg)] 166 lb 3.6 oz (75.4 kg) (11/05 0500) HEMODYNAMICS: CVP:  [12 mmHg-16 mmHg] 14 mmHg VENTILATOR SETTINGS: Vent Mode:  [-] CPAP FiO2 (%):  [40 %] 40 % Set Rate:  [15 bmp] 15 bmp Vt Set:  [400 mL] 400 mL PEEP:  [5 cmH20] 5 cmH20 Pressure Support:  [20 cmH20] 20 cmH20 Plateau Pressure:  [31 cmH20-35 cmH20] 31 cmH20 INTAKE / OUTPUT:  Intake/Output Summary (Last 24 hours) at 01/19/15 0942 Last data filed at 01/19/15 0900  Gross per 24 hour  Intake 1390.56 ml  Output   2320 ml  Net -929.44 ml    PHYSICAL EXAMINATION: General: pleasant HEENT: ETT in place PULM: no wheeze CV: regular GI: non tender MSK: 1+ edema Derm: bruises extensor surfaces, ecchymosis Neuro: RASS 0  LABS:  CBC  Recent Labs Lab 01/17/15 0250 01/18/15 0510 01/19/15 0450  WBC 10.2 10.7* 9.7  HGB 9.1* 9.1* 9.5*  HCT 28.6* 27.9* 30.1*  PLT 387 385 414*   Coag's  Recent Labs Lab 01/15/15 1424 01/15/15 2115  APTT 35 26  INR 1.08 1.12   BMET  Recent Labs Lab 01/17/15 0250  01/18/15 0510 01/19/15 0450  NA 130* 135 139  K 3.7 3.2* 4.2  CL 92* 91* 96*  CO2 31 35* 36*  BUN 16 20 22*  CREATININE 0.56 0.49 0.54  GLUCOSE 161* 111* 133*   Electrolytes  Recent Labs Lab 01/16/15 0645 01/17/15 0250 01/18/15 0510 01/19/15 0450  CALCIUM 7.8* 8.3* 8.2* 8.4*  MG 1.4*  --  1.6* 1.8  PHOS 3.8  --  1.8* 1.7*   Sepsis Markers  Recent Labs Lab 01/15/15 1850 01/15/15 1921  LATICACIDVEN 4.5* 4.97*  PROCALCITON 0.28  --    ABG  Recent Labs Lab 01/15/15 1851 01/16/15 0350  PHART 7.392 7.410  PCO2ART 47.1* 41.0  PO2ART 368* 154*   Liver Enzymes  Recent Labs Lab 01/15/15 1424 01/15/15 1850 01/19/15 0450  AST '20 24 17  '$ ALT 16 16 11*  ALKPHOS 107 99 92  BILITOT 0.4 0.8 0.7  ALBUMIN 3.2* 2.8* 2.2*   Cardiac Enzymes  Recent Labs Lab 01/15/15 1850 01/16/15 0027 01/16/15 0645  TROPONINI 0.05* 0.07* 0.06*   Glucose  Recent Labs Lab 01/18/15 0757 01/18/15 1228 01/18/15 1551 01/18/15 1938 01/19/15 0004 01/19/15 0751  GLUCAP 128* 126* 97 87 136* 138*    Imaging Dg Abd 1 View  01/17/2015  CLINICAL DATA:  74 year old female status post nasogastric tube manipulation. EXAM: ABDOMEN - 1 VIEW COMPARISON:  Prior radiograph obtained earlier today  at 17:56 p.m. FINDINGS: The nasogastric tube has been advanced. The tip is now well positioned in the gastric body. Incompletely imaged left-sided central venous catheter and port catheter with tips projecting over the distal SVC and upper right atrium. Bilateral pleural effusions and associated bibasilar atelectasis. The bowel gas pattern is not obstructed. IMPRESSION: The nasogastric tube has been advanced and the tip is now in the mid gastric body. Electronically Signed   By: Jacqulynn Cadet M.D.   On: 01/17/2015 19:50   Dg Abd 1 View  01/17/2015  CLINICAL DATA:  74 year old female status post nasogastric tube placement. EXAM: ABDOMEN - 1 VIEW COMPARISON:  Chest x-ray obtained earlier today FINDINGS:  The tip of the nasogastric tube projects over the gastroesophageal junction. The bowel gas pattern is not obstructed. Round calcification overlying the right renal pelvis may represent a kidney stone. No acute osseous abnormality. Mild degenerative change in the bilateral hips and lumbar spine. Vascular catheter projects over the right hip joint. This may be an arterial line. IMPRESSION: The tip of the nasogastric tube projects over the gastroesophageal junction. Recommend advancing 7 cm for more optimal placement in the gastric body. Electronically Signed   By: Jacqulynn Cadet M.D.   On: 01/17/2015 18:22   Dg Chest Port 1 View  01/19/2015  CLINICAL DATA:  Aspiration pneumonia.  Metastatic breast cancer. EXAM: PORTABLE CHEST 1 VIEW COMPARISON:  Chest CT dated 07/03/2014 and chest x-rays 01/18/2015, 01/17/2015 and 01/15/2015 FINDINGS: Endotracheal tube, power port, NG tube and central venous catheter are unchanged. Power port tip is in the right atrium. Poorly defined pulmonary nodules are noted in the right midzone. Loculated pleural fluid at the right lung base is unchanged. Patchy infiltrate at the left lung base is unchanged. Heart size and pulmonary vascularity are normal. No acute osseous abnormality. IMPRESSION: No change in the appearance of the chest. Patchy infiltrate at the left base. Pulmonary nodules and loculated fluid on the right, stable. Electronically Signed   By: Lorriane Shire M.D.   On: 01/19/2015 08:23   Dg Chest Port 1 View  01/18/2015  CLINICAL DATA:  Respiratory failure, CHF, shock. EXAM: PORTABLE CHEST 1 VIEW COMPARISON:  Portable chest x-ray of January 17, 2015 FINDINGS: The lungs are adequately inflated. A moderate-sized right pleural effusion and smaller left pleural effusion persist. The interstitial markings of both lungs are increased. The heart is normal in size. The pulmonary vascularity is not engorged. The endotracheal tube tip lies 5.2 cm above the carina. The  esophagogastric tube tip projects below the inferior margin of the image. The Port-A-Cath appliance tip projects over the midportion of the SVC. The left internal jugular venous catheter tip projects over the distal SVC. IMPRESSION: Allowing for slight differences in positioning there has not been significant interval change since the previous study. Interstitial infiltrates and bilateral pleural effusions remain. The support tubes are in reasonable position. Electronically Signed   By: David  Martinique M.D.   On: 01/18/2015 07:17     ASSESSMENT / PLAN: PULMONARY ETT 11/1>> A: Acute respiratory failure 2nd to PNA (Aspiration vs HCAP) and pulmonary edema. Hx of PE from January 2016. Rt loculated pleural effusion >> likely malignant. Hx of asthma with wheezing. P:   Pressure support wean as tolerated Continue pulmicort, atrovent, and add xopenex F/u CXR Diuresis as tolerated Tx dose lovenox  CARDIOVASCULAR Lt IJ CVL 11/1 >> Port >> A:  A fib/flutter with RVR >> back in sinus rhythm. Septic shock >> resolved. P: D/c amiodarone  11/05 Monitor hemodynamics  RENAL A:  Hypokalemia. P:   Replace as needed  GASTROINTESTINAL A:   Protein calorie malnutrition. P:   Continue tubefeeds Protonix for SUP  HEMATOLOGIC/ONCOLOGY A:   Stage IV breast cancer. P:  F/u at [facility]   INFECTIOUS A:   Septic shock 2nd to PNA, UTI. P:   Day 5 of Abx, currently on levaquin  BCx2 11/1 >> Sputum 11/1 >> Urine 11/2 >> E coli  ENDOCRINE A:   Relative adrenal insufficiency, on chronic steroids as outpt. P:   Wean off solu cortef  NEUROLOGIC A:   Cancer pain. P:   RASS goal 0 PRN fentanyl  GOALS of CARE No CPR, No defibrillation.  If not improvement with vent weaning, then transition to comfort measures.  Updated pt's daughter at bedside.  CC time 36 minutes.  Chesley Mires, MD [facility] Pulmonary/Critical Care 01/19/2015, 10:04 AM Pager:  [phone] After 3pm call: [phone]

## 2015-01-20 ENCOUNTER — Inpatient Hospital Stay (HOSPITAL_COMMUNITY): Payer: Medicare Other

## 2015-01-20 LAB — CBC
HCT: 29.5 % — ABNORMAL LOW (ref 36.0–46.0)
Hemoglobin: 9.2 g/dL — ABNORMAL LOW (ref 12.0–15.0)
MCH: 30.9 pg (ref 26.0–34.0)
MCHC: 31.2 g/dL (ref 30.0–36.0)
MCV: 99 fL (ref 78.0–100.0)
Platelets: 385 10*3/uL (ref 150–400)
RBC: 2.98 MIL/uL — ABNORMAL LOW (ref 3.87–5.11)
RDW: 21.5 % — ABNORMAL HIGH (ref 11.5–15.5)
WBC: 8.9 10*3/uL (ref 4.0–10.5)

## 2015-01-20 LAB — BASIC METABOLIC PANEL
Anion gap: 7 (ref 5–15)
BUN: 31 mg/dL — AB (ref 6–20)
CALCIUM: 8.4 mg/dL — AB (ref 8.9–10.3)
CO2: 43 mmol/L — ABNORMAL HIGH (ref 22–32)
Chloride: 91 mmol/L — ABNORMAL LOW (ref 101–111)
Creatinine, Ser: 0.5 mg/dL (ref 0.44–1.00)
GFR calc Af Amer: >60 mL/min (ref >60)
GLUCOSE: 165 mg/dL — AB (ref 65–99)
POTASSIUM: 2.7 mmol/L — AB (ref 3.5–5.1)
Sodium: 141 mmol/L (ref 135–145)

## 2015-01-20 LAB — CULTURE, BLOOD (ROUTINE X 2)
CULTURE: NO GROWTH
CULTURE: NO GROWTH

## 2015-01-20 LAB — GLUCOSE, CAPILLARY
GLUCOSE-CAPILLARY: 162 mg/dL — AB (ref 65–99)
Glucose-Capillary: 200 mg/dL — ABNORMAL HIGH (ref 65–99)
Glucose-Capillary: 176 mg/dL — ABNORMAL HIGH (ref 65–99)
Glucose-Capillary: 196 mg/dL — ABNORMAL HIGH (ref 65–99)
Glucose-Capillary: 105 mg/dL — ABNORMAL HIGH (ref 65–99)

## 2015-01-20 LAB — MAGNESIUM: Magnesium: 1.6 mg/dL — ABNORMAL LOW (ref 1.7–2.4)

## 2015-01-20 MED ORDER — POTASSIUM CHLORIDE 10 MEQ/100ML IV SOLN: 10.0000 meq | INTRAVENOUS | Status: DC

## 2015-01-20 MED ORDER — POTASSIUM CHLORIDE 20 MEQ/15ML (10%) PO SOLN
40.0000 meq | Freq: Every day | ORAL | Status: DC
  Administered 2015-01-20 – 2015-01-27 (×8): 40 meq via ORAL
  Filled 2015-01-20 (×8): qty 30

## 2015-01-20 MED ORDER — POTASSIUM CHLORIDE 10 MEQ/50ML IV SOLN
10.0000 meq | INTRAVENOUS | Status: AC
Start: 2015-01-20 — End: 2015-01-20
  Administered 2015-01-20 (×2): 10 meq via INTRAVENOUS
  Filled 2015-01-20: qty 50

## 2015-01-20 MED ORDER — FUROSEMIDE 10 MG/ML IJ SOLN
40.0000 mg | Freq: Every day | INTRAMUSCULAR | Status: DC
  Administered 2015-01-21 – 2015-01-27 (×7): 40 mg via INTRAVENOUS
  Filled 2015-01-20 (×7): qty 4

## 2015-01-20 MED ORDER — MAGNESIUM SULFATE 2 GM/50ML IV SOLN
2.0000 g | Freq: Once | INTRAVENOUS | Status: AC
  Administered 2015-01-20: 2 g via INTRAVENOUS
  Filled 2015-01-20: qty 50

## 2015-01-20 MED ORDER — POTASSIUM CHLORIDE CRYS ER 20 MEQ PO TBCR: 40.0000 meq | EXTENDED_RELEASE_TABLET | Freq: Once | ORAL | Status: DC

## 2015-01-20 NOTE — Progress Notes (Signed)
CRITICAL VALUE ALERT  Critical value received:  K+  Date of notification:  01/20/2015  Time of notification:  0648  Critical value read back: yes  Nurse who received alert:  Dyann Ruddle, RN  MD notified (1st page):  CCM  Time of first page:  [phone]  MD notified (2nd page):  Time of second page:  Responding MD:  CCM  Time MD responded:  [phone]

## 2015-01-20 NOTE — Progress Notes (Signed)
Patient ID: [name] [name], female   DOB: 08-11-1940, 74 y.o.   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 She has failed multiple rounds of chemotherapy and is currently getting palliative chemo. Also with h/o PE on chronic anticoagulation with Lovenox. H/o influenza, HCAP and postobstructive PNA 06/2014.  Admitted to [facility] 01/15/15 for acute respiratory failure in the setting of aspiration, sepsis and new atrial flutter w/ RVR.  Subjective: Remains Intubated. Husband by bedside. Per RN, no events overnight.  Objective: Vital signs in last 24 hours: Temp:  [98.1 F (36.7 C)-99.1 F (37.3 C)] 98.8 F (37.1 C) (11/06 0800) Pulse Rate:  [76-112] 76 (11/06 0314) Resp:  [0-25] 10 (11/06 0800) BP: (88-143)/(38-68) 97/38 mmHg (11/06 0800) SpO2:  [10 %-100 %] 98 % (11/06 0800) FiO2 (%):  [30 %-40 %] 30 % (11/06 0800) Weight:  [73.2 kg (161 lb 6 oz)] 73.2 kg (161 lb 6 oz) (11/06 0500) Last BM Date: 01/17/15  Intake/Output from previous day: 11/05 0701 - 11/06 0700 In: 2018.1 [I.V.:538.1; NG/GT:1330; IV Piggyback:150] Out: 3375 [Urine:3375] Intake/Output this shift: Total I/O In: 115.5 [I.V.:10.5; NG/GT:55; IV Piggyback:50] Out: 200 [Urine:200]  Medications Current Facility-Administered Medications  Medication Dose Route Frequency Provider Last Rate Last Dose  . acetaminophen (TYLENOL) tablet 1,000 mg  1,000 mg Per Tube Q6H PRN Colbert Coyer, MD   1,000 mg at 01/20/15 0141  . antiseptic oral rinse solution (CORINZ)  7 mL Mouth Rinse QID Juanito Doom, MD   7 mL at 01/20/15 0459  . bisacodyl (DULCOLAX) suppository 10 mg  10 mg Rectal Daily PRN Chesley Mires, MD      . budesonide (PULMICORT) nebulizer solution 0.5 mg  0.5 mg Nebulization BID Erick Colace, NP   0.5 mg at 01/20/15 0815  . chlorhexidine gluconate (PERIDEX) 0.12 % solution 15 mL   15 mL Mouth Rinse BID Juanito Doom, MD   15 mL at 01/20/15 0751  . enoxaparin (LOVENOX) injection 50 mg  50 mg Subcutaneous Daily Berton Mount, RPH   50 mg at 01/19/15 0948  . feeding supplement (VITAL HIGH PROTEIN) liquid 1,000 mL  1,000 mL Per Tube Continuous Clayton Bibles, RD 55 mL/hr at 01/20/15 0746 1,000 mL at 01/20/15 0746  . fentaNYL (SUBLIMAZE) 2,500 mcg in sodium chloride 0.9 % 250 mL (10 mcg/mL) infusion  25-400 mcg/hr Intravenous Continuous Chesley Mires, MD 2.5 mL/hr at 01/19/15 2200 25 mcg/hr at 01/19/15 2200  . fentaNYL (SUBLIMAZE) bolus via infusion 25 mcg  25 mcg Intravenous Q1H PRN Chesley Mires, MD      . furosemide (LASIX) injection 40 mg  40 mg Intravenous 3 times per day Erick Colace, NP   40 mg at 01/20/15 0530  . hydrocortisone sodium succinate (SOLU-CORTEF) 100 MG injection 100 mg  100 mg Intravenous Q12H Erick Colace, NP   100 mg at 01/20/15 0530  . insulin aspart (novoLOG) injection 2-6 Units  2-6 Units Subcutaneous 6 times per day Flora Lipps, MD   2 Units at 01/20/15 0459  . ipratropium (ATROVENT) nebulizer solution 0.5 mg  0.5 mg Nebulization Q6H Erick Colace, NP   0.5 mg at 01/20/15 0814  . levalbuterol (XOPENEX) nebulizer solution 0.63 mg  0.63 mg Nebulization Q6H Chesley Mires, MD   0.63 mg at 01/20/15 0814  . levofloxacin (LEVAQUIN) IVPB 750 mg  750 mg Intravenous Q24H Erick Colace, NP  750 mg at 01/19/15 1329  . magic mouthwash  5 mL Oral Q6H PRN Erick Colace, NP      . magnesium sulfate IVPB 2 g 50 mL  2 g Intravenous Once Brand Males, MD   2 g at 01/20/15 0746  . nystatin (MYCOSTATIN) 100000 UNIT/ML suspension 500,000 Units  5 mL Oral QID Erick Colace, NP   500,000 Units at 01/16/15 1400  . ondansetron (ZOFRAN) injection 4 mg  4 mg Intravenous Q6H PRN Anders Simmonds, MD   4 mg at 01/18/15 1716  . pantoprazole sodium (PROTONIX) 40 mg/20 mL oral suspension 40 mg  40 mg Per Tube Q1200 Erick Colace, NP   40 mg at 01/19/15 1329  .  potassium chloride 10 mEq in 50 mL *CENTRAL LINE* IVPB  10 mEq Intravenous Q1 Hr x 2 Vineet Sood, MD      . potassium chloride 20 MEQ/15ML (10%) solution 40 mEq  40 mEq Oral Daily Chesley Mires, MD   40 mEq at 01/20/15 0746  . promethazine (PHENERGAN) injection 12.5 mg  12.5 mg Intravenous Q6H PRN Anders Simmonds, MD      . sennosides (SENOKOT) 8.8 MG/5ML syrup 5 mL  5 mL Per Tube BID PRN Chesley Mires, MD        PE: General appearance: alert, cooperative, no distress and intubated Neck: no carotid bruit and no JVD Lungs: Intubated. CTAB Heart: regular rate and rhythm Extremities: no LEE Pulses: 2+ and symmetric Skin: warm and dry Neurologic: Grossly normal  Lab Results:   Recent Labs  01/18/15 0510 01/19/15 0450 01/20/15 0546  WBC 10.7* 9.7 8.9  HGB 9.1* 9.5* 9.2*  HCT 27.9* 30.1* 29.5*  PLT 385 414* 385   BMET  Recent Labs  01/18/15 0510 01/19/15 0450 01/20/15 0546  NA 135 139 141  K 3.2* 4.2 2.7*  CL 91* 96* 91*  CO2 35* 36* 43*  GLUCOSE 111* 133* 165*  BUN 20 22* 31*  CREATININE 0.49 0.54 0.50  CALCIUM 8.2* 8.4* 8.4*   PT/INR No results for input(s): LABPROT, INR in the last 72 hours. Cardiac Panel (last 3 results) No results for input(s): CKTOTAL, CKMB, TROPONINI, RELINDX in the last 72 hours.  Studies/Results: Study Conclusions  - Left ventricle: The cavity size was normal. Wall thickness was normal. Systolic function was normal. The estimated ejection fraction was in the range of 55% to 60%. Wall motion was normal; there were no regional wall motion abnormalities. - Mitral valve: There was mild regurgitation. - Pulmonary arteries: Systolic pressure was mildly to moderately increased. PA peak pressure: 39 mm Hg (S).  Pericardium: There was no pericardial effusion.   Assessment/Plan  Active Problems:   Shock ([place])   Respiratory failure (HCC)   Atrial flutter with rapid ventricular response (HCC)   Acute respiratory failure with hypoxia  (HCC)   Acute diastolic CHF (congestive heart failure) (HCC)   Acute respiratory failure with hypoxemia (HCC)   Aspiration pneumonia (HCC)   Metastatic breast cancer ([place])  1. New onset atrial flutter of unknown duration with RVR: Maint NSR continue current Rx   Amiodarone stopped yesterday  2. Acute respiratory failure secondary to aspiration in setting of significant metastatic breast CA with pleural effusions: Remains intubated. No significant pulmonary edema noted on chest xray at time of admit. 2D echo with normal LVEF of 55-60% with normal wall motion. No pericardial effusion. Back on pressure support this am ? Extubated in am   3. 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 Per spouse, she has been declining rapidly over the past few weeks with weakness, malaise and poor PO intake.  4. Hypotension with shock: ? Secondary to acute respiratory failure with hypoxia and aspiration, sepsis from UTI, dehydration and rapid atrial flutter. Now off pressors.  5. History of PE with DVT on SQ Lovenox: restarting Lovenox  5. History of influenza, HCAP and postobstructive PNA 06/2014   LOS: 5 days   Will sign off call if flutter returns  Discussed with husband  Jenkins Rouge

## 2015-01-20 NOTE — Progress Notes (Signed)
[facility] Physician Progress Note and Electrolyte Replacement  Patient Name: [name] [name] DOB: Sep 08, 1940 MRN: [id number]  Date of Service  01/20/2015   HPI/Events of Note    Recent Labs Lab 01/15/15 1850 01/16/15 0645 01/17/15 0250 01/18/15 0510 01/19/15 0450 01/20/15 0546  NA 128* 122* 130* 135 139 141  K 4.3 4.4 3.7 3.2* 4.2 2.7*  CL 86* 85* 92* 91* 96* 91*  CO2 '30 25 31 '$ 35* 36* 43*  GLUCOSE 253* 216* 161* 111* 133* 165*  BUN '15 17 16 20 '$ 22* 31*  CREATININE 0.91 1.17* 0.56 0.49 0.54 0.50  CALCIUM 8.6* 7.8* 8.3* 8.2* 8.4* 8.4*  MG 1.5* 1.4*  --  1.6* 1.8 1.6*  PHOS 5.4* 3.8  --  1.8* 1.7*  --     Estimated Creatinine Clearance: 58.6 mL/min (by C-G formula based on Cr of 0.5).  Intake/Output      11/05 0701 - 11/06 0700 11/06 0701 - 11/07 0700   I.V. (mL/kg) 517.6 (7.1)    Other     NG/GT 1275    IV Piggyback 150    Total Intake(mL/kg) 1942.6 (26.5)    Urine (mL/kg/hr) 3375 (1.9)    Total Output 3375     Net -1432.4           - I/O DETAILED x 24h      - I/O THIS SHIFT    ASSESSMENT Severe low K Mod low mag  eICURN Interventions  2gm mag sulfate 49mq kcl   ASSESSMENT: MAJOR ELECTROLYTE      Dr. MBrand Males M.D., FWoodcrest Surgery CenterC.P Pulmonary and Critical Care Medicine Staff Physician CRio BlancoPulmonary and Critical Care Pager: [phone] If no answer or between  15:00h - 7:00h: call [phone]  01/20/2015 7:04 AM

## 2015-01-20 NOTE — Progress Notes (Signed)
[name] [name] was bedside reading a book to his wife when I arrived. She offered a seat and we had a very pleasant visit. Listened as he described his/their faith journey and 22 year marriage. Provided empathic presence. Pt (non-verbally/with hands) requested prayer at the conclusion of our visit. Both were very appreciative of visit and prayer. Page if additional support is needed. Chaplain Marlise Eves Holder   01/20/15 1700  Clinical Encounter Type  Visited With Patient and family together

## 2015-01-20 NOTE — Progress Notes (Signed)
PULMONARY / CRITICAL CARE MEDICINE   Name: [name] [name] MRN: [id number] DOB: 19-Apr-1940    ADMISSION DATE:  01/15/2015 CONSULTATION DATE:  01/15/15  REFERRING MD :  ED  CHIEF COMPLAINT:  Aspiration, vent dependent respiratory failure, sepsis, atrial flutter.  INITIAL PRESENTATION: 74 yo female presented with respiratory failure, a flutter with RVR.  Has hx of Stage IV breast cancer.   STUDIES:  11/02 Echo >> EF 55 to 60%  SIGNIFICANT EVENTS: 11/1 Admit, amio/heparin gtt, phenylephrine, abx 11/2 off pressors, sinus rhythm 11/3 start lasix 11/4 wheezing >> added ICS 11/5 d/c amiodarone, cardiology sign off  SUBJECTIVE:  Not able to tolerate pressure support this morning.  VITAL SIGNS: Temp:  [98.4 F (36.9 C)-99.1 F (37.3 C)] 98.8 F (37.1 C) (11/06 0900) Pulse Rate:  [76-112] 76 (11/06 0314) Resp:  [0-25] 8 (11/06 0900) BP: (88-143)/(38-68) 110/50 mmHg (11/06 0900) SpO2:  [10 %-100 %] 97 % (11/06 0900) FiO2 (%):  [30 %-40 %] 30 % (11/06 1033) Weight:  [161 lb 6 oz (73.2 kg)] 161 lb 6 oz (73.2 kg) (11/06 0500) HEMODYNAMICS: CVP:  [8 mmHg-12 mmHg] 8 mmHg VENTILATOR SETTINGS: Vent Mode:  [-] PRVC FiO2 (%):  [30 %-40 %] 30 % Set Rate:  [15 bmp] 15 bmp Vt Set:  [400 mL] 400 mL PEEP:  [5 cmH20] 5 cmH20 Pressure Support:  [20 cmH20] 20 cmH20 Plateau Pressure:  [17 cmH20-32 cmH20] 30 cmH20 INTAKE / OUTPUT:  Intake/Output Summary (Last 24 hours) at 01/20/15 1132 Last data filed at 01/20/15 1000  Gross per 24 hour  Intake 2034.5 ml  Output   3200 ml  Net -1165.5 ml    PHYSICAL EXAMINATION: General: pleasant HEENT: ETT in place PULM: b/l rhonchi CV: regular GI: non tender MSK: 1+ edema Derm: bruises extensor surfaces, ecchymosis Neuro: RASS 0  LABS:  CBC  Recent Labs Lab 01/18/15 0510 01/19/15 0450 01/20/15 0546  WBC 10.7* 9.7 8.9  HGB 9.1* 9.5* 9.2*  HCT 27.9* 30.1* 29.5*  PLT 385 414* 385   Coag's  Recent Labs Lab 01/15/15 1424  01/15/15 2115  APTT 35 26  INR 1.08 1.12   BMET  Recent Labs Lab 01/18/15 0510 01/19/15 0450 01/20/15 0546  NA 135 139 141  K 3.2* 4.2 2.7*  CL 91* 96* 91*  CO2 35* 36* 43*  BUN 20 22* 31*  CREATININE 0.49 0.54 0.50  GLUCOSE 111* 133* 165*   Electrolytes  Recent Labs Lab 01/16/15 0645  01/18/15 0510 01/19/15 0450 01/20/15 0546  CALCIUM 7.8*  < > 8.2* 8.4* 8.4*  MG 1.4*  --  1.6* 1.8 1.6*  PHOS 3.8  --  1.8* 1.7*  --   < > = values in this interval not displayed.   Sepsis Markers  Recent Labs Lab 01/15/15 1850 01/15/15 1921  LATICACIDVEN 4.5* 4.97*  PROCALCITON 0.28  --    ABG  Recent Labs Lab 01/15/15 1851 01/16/15 0350  PHART 7.392 7.410  PCO2ART 47.1* 41.0  PO2ART 368* 154*   Liver Enzymes  Recent Labs Lab 01/15/15 1424 01/15/15 1850 01/19/15 0450  AST '20 24 17  '$ ALT 16 16 11*  ALKPHOS 107 99 92  BILITOT 0.4 0.8 0.7  ALBUMIN 3.2* 2.8* 2.2*   Cardiac Enzymes  Recent Labs Lab 01/15/15 1850 01/16/15 0027 01/16/15 0645  TROPONINI 0.05* 0.07* 0.06*   Glucose  Recent Labs Lab 01/19/15 0751 01/19/15 1150 01/19/15 1608 01/19/15 1959 01/20/15 0117 01/20/15 0812  GLUCAP 138* 177* 136* 115* 200* 176*  Imaging Dg Chest Port 1 View  01/20/2015  CLINICAL DATA:  74 year old female with history of respiratory failure. EXAM: PORTABLE CHEST 1 VIEW COMPARISON:  Multiple priors, most recently chest x-ray 01/19/2015. FINDINGS: An endotracheal tube is in place with tip 4.7 cm above the carina. Left internal jugular single-lumen porta cath with tip terminating in the distal superior vena cava. There is a left-sided internal jugular central venous catheter with tip terminating in the right atrium. A nasogastric tube is seen extending into the stomach, however, the tip of the nasogastric tube extends below the lower margin of the image. Moderate partially loculated right pleural effusion and small left pleural effusion appear unchanged. Bibasilar  opacities may reflect areas of atelectasis and/or airspace consolidation. Coarse interstitial markings throughout the lungs bilaterally, similar to the prior study, with multiple bilateral pulmonary nodules which appear very similar. Heart size is upper limits of normal. Upper mediastinal contours are within normal limits. IMPRESSION: 1. Support apparatus, as above. 2. Radiographic appearance the chest is otherwise essentially unchanged, as detailed above. Electronically Signed   By: Vinnie Langton M.D.   On: 01/20/2015 07:12   Dg Chest Port 1 View  01/19/2015  CLINICAL DATA:  Aspiration pneumonia.  Metastatic breast cancer. EXAM: PORTABLE CHEST 1 VIEW COMPARISON:  Chest CT dated 07/03/2014 and chest x-rays 01/18/2015, 01/17/2015 and 01/15/2015 FINDINGS: Endotracheal tube, power port, NG tube and central venous catheter are unchanged. Power port tip is in the right atrium. Poorly defined pulmonary nodules are noted in the right midzone. Loculated pleural fluid at the right lung base is unchanged. Patchy infiltrate at the left lung base is unchanged. Heart size and pulmonary vascularity are normal. No acute osseous abnormality. IMPRESSION: No change in the appearance of the chest. Patchy infiltrate at the left base. Pulmonary nodules and loculated fluid on the right, stable. Electronically Signed   By: Lorriane Shire M.D.   On: 01/19/2015 08:23     ASSESSMENT / PLAN: PULMONARY ETT 11/1>> A: Acute respiratory failure 2nd to PNA (Aspiration vs HCAP) and pulmonary edema. Hx of PE from January 2016. Rt loculated pleural effusion >> likely malignant. Hx of asthma with wheezing. P:   Pressure support wean as tolerated >> not making progress Continue pulmicort, atrovent, and add xopenex F/u CXR Diuresis as tolerated Tx dose lovenox  CARDIOVASCULAR Lt IJ CVL 11/1 >> Port >> A:  A fib/flutter with RVR >> back in sinus rhythm. Septic shock >> resolved. P: D/c'ed amiodarone 11/05 Monitor  hemodynamics  RENAL A:  Hypokalemia. P:   Replace as needed  GASTROINTESTINAL A:   Protein calorie malnutrition. P:   Continue tubefeeds Protonix for SUP  HEMATOLOGIC/ONCOLOGY A:   Stage IV breast cancer. P:  F/u at [facility]   INFECTIOUS A:   Septic shock 2nd to PNA, UTI. P:   Day 6 of Abx, currently on levaquin  BCx2 11/1 >> Sputum 11/1 >> Urine 11/2 >> E coli  ENDOCRINE A:   Relative adrenal insufficiency, on chronic steroids as outpt. P:   Wean off solu cortef  NEUROLOGIC A:   Cancer pain. P:   RASS goal 0 PRN fentanyl  GOALS of CARE No CPR, No defibrillation.    Updated pts family at bedside.  Explained that she is not making progress with vent weaning.  Reviewed option of tracheostomy and long term vent support >> they had not considered this previous.  I don't think tracheostomy will be in her best interest.  Will need to discuss with family further  depending on her progress.  CC time 38 minutes.  Chesley Mires, MD [facility] Pulmonary/Critical Care 01/20/2015, 11:32 AM Pager:  [phone] After 3pm call: [phone]

## 2015-01-21 ENCOUNTER — Inpatient Hospital Stay (HOSPITAL_COMMUNITY): Payer: Medicare Other

## 2015-01-21 LAB — BASIC METABOLIC PANEL
ANION GAP: 7 (ref 5–15)
Anion gap: 8 (ref 5–15)
BUN: 30 mg/dL — AB (ref 6–20)
BUN: 28 mg/dL — ABNORMAL HIGH (ref 6–20)
CALCIUM: 8.7 mg/dL — AB (ref 8.9–10.3)
CHLORIDE: 93 mmol/L — AB (ref 101–111)
CO2: 43 mmol/L — AB (ref 22–32)
CO2: 41 mmol/L — ABNORMAL HIGH (ref 22–32)
CREATININE: 0.45 mg/dL (ref 0.44–1.00)
Calcium: 8.8 mg/dL — ABNORMAL LOW (ref 8.9–10.3)
Chloride: 97 mmol/L — ABNORMAL LOW (ref 101–111)
Creatinine, Ser: 0.43 mg/dL — ABNORMAL LOW (ref 0.44–1.00)
GFR calc Af Amer: >60 mL/min (ref >60)
GFR calc Af Amer: >60 mL/min (ref >60)
GFR calc non Af Amer: >60 mL/min (ref >60)
Glucose, Bld: 178 mg/dL — ABNORMAL HIGH (ref 65–99)
Glucose, Bld: 170 mg/dL — ABNORMAL HIGH (ref 65–99)
POTASSIUM: 3.7 mmol/L (ref 3.5–5.1)
Potassium: 2.7 mmol/L — CL (ref 3.5–5.1)
SODIUM: 144 mmol/L (ref 135–145)
SODIUM: 145 mmol/L (ref 135–145)

## 2015-01-21 LAB — GLUCOSE, CAPILLARY
GLUCOSE-CAPILLARY: 140 mg/dL — AB (ref 65–99)
GLUCOSE-CAPILLARY: 197 mg/dL — AB (ref 65–99)
GLUCOSE-CAPILLARY: 176 mg/dL — AB (ref 65–99)
GLUCOSE-CAPILLARY: 154 mg/dL — AB (ref 65–99)
Glucose-Capillary: 222 mg/dL — ABNORMAL HIGH (ref 65–99)
Glucose-Capillary: 161 mg/dL — ABNORMAL HIGH (ref 65–99)
Glucose-Capillary: 152 mg/dL — ABNORMAL HIGH (ref 65–99)
Glucose-Capillary: 120 mg/dL — ABNORMAL HIGH (ref 65–99)

## 2015-01-21 LAB — CBC
HEMATOCRIT: 29.9 % — AB (ref 36.0–46.0)
HEMOGLOBIN: 9.2 g/dL — AB (ref 12.0–15.0)
MCH: 30.9 pg (ref 26.0–34.0)
MCHC: 30.8 g/dL (ref 30.0–36.0)
MCV: 100.3 fL — ABNORMAL HIGH (ref 78.0–100.0)
Platelets: 357 10*3/uL (ref 150–400)
RBC: 2.98 MIL/uL — ABNORMAL LOW (ref 3.87–5.11)
RDW: 21.9 % — ABNORMAL HIGH (ref 11.5–15.5)
WBC: 11.6 10*3/uL — ABNORMAL HIGH (ref 4.0–10.5)

## 2015-01-21 LAB — PHOSPHORUS: Phosphorus: 2.8 mg/dL (ref 2.5–4.6)

## 2015-01-21 LAB — MAGNESIUM: Magnesium: 1.9 mg/dL (ref 1.7–2.4)

## 2015-01-21 MED ORDER — POTASSIUM CHLORIDE 20 MEQ/15ML (10%) PO SOLN
40.0000 meq | Freq: Once | ORAL | Status: AC
  Administered 2015-01-21: 40 meq
  Filled 2015-01-21: qty 30

## 2015-01-21 NOTE — Progress Notes (Signed)
Patient indicated with hand motions she is feeling nauseated and her stomach hurts. Tube feedings initially turned down to 25cc/hr. Patient requested that tube feeding be turned completely off if possible until nausea subsides. Tube feedings stopped and gastric residuals checked, 100cc of tan content was pulled from the OG tube and replaced. Will continue to monitor patient for nausea and will restart tube feeds when patient feels better. Luther Parody, RN

## 2015-01-21 NOTE — Plan of Care (Signed)
Problem: Phase I Progression Outcomes Goal: Optimized method of communication. Outcome: Completed/Met Date Met:  01/21/15 Patient able to write

## 2015-01-21 NOTE — Progress Notes (Signed)
Date: January 21, 2015 Chart reviewed for concurrent status and case management needs. Will continue to follow patient for changes and needs: Remain on full vent support/aflutter controlled/sepsis Velva Harman, RN, BSN, [place]   [phone]

## 2015-01-21 NOTE — Progress Notes (Signed)
[place] for Enoxaparin Indication: History of PE on therapeutic lovenox PTA; atrial flutter  Allergies  Allergen Reactions  . Albuterol Shortness Of Breath    Tachycardia, feels like she is going to die  . Penicillins Other (See Comments)    Childhood reaction Has patient had a PCN reaction causing immediate rash, facial/tongue/throat swelling, SOB or lightheadedness with hypotension: unknown Has patient had a PCN reaction causing severe rash involving mucus membranes or skin necrosis: unknown Has patient had a PCN reaction that required hospitalization unknown Has patient had a PCN reaction occurring within the last 10 years: unknown If all of the above answers are "NO", then may proceed with Cephalosporin use.   . Albuterol Sulfate Other (See Comments)    TACHYCARDIA  . Codeine Nausea Only and Other (See Comments)    woosy  . Ibuprofen Other (See Comments)    Induces patient asthma  . Meperidine Nausea Only    Other reaction(s): Other (See Comments) "wiped out" and nausea  . Other     Unknown narcotic  . Phenobarbital Other (See Comments)    Erratic behavior, hyper, jittery  . Adhesive [Tape] Rash   Patient Measurements: Height: '5\' 2"'$  (157.5 cm) Weight: 161 lb 6 oz (73.2 kg) IBW/kg (Calculated) : 50.1 HEPARIN DW (KG): 65.9  Vital Signs: Temp: 98.8 F (37.1 C) (11/07 1000) Temp Source: Core (Comment) (11/07 0800) BP: 98/62 mmHg (11/07 1000) Pulse Rate: 87 (11/07 0805)  Labs:  Recent Labs  01/19/15 0450 01/20/15 0546 01/21/15 0445  HGB 9.5* 9.2* 9.2*  HCT 30.1* 29.5* 29.9*  PLT 414* 385 357  CREATININE 0.54 0.50 0.45   Estimated Creatinine Clearance: 58.6 mL/min (by C-G formula based on Cr of 0.45).  Medical History: Past Medical History  Diagnosis Date  . Asthma   . Breast pain in female     throbbing  . Abdominal pain   . Breast lump in female   . Cancer ([place])     rt  . PE (pulmonary embolism) 03/26/2014  .  Neuropathy ([place])   . GERD (gastroesophageal reflux disease)   . History of hiatal hernia    Assessment: 42 y/oF with PMH of metastatic breast cancer undergoing chemotherapy at [facility], PE on chronic Lovenox for anticoagulation, and GERD who presented to [facility] ED for evaluation of possible aspiration and SOB. Patient decompensated in ED and was intubated for respiratory failure. Patient also found to be in a-flutter.  Lovenox held on admission and pharmacy was consulted to assist with dosing of heparin infusion. Noted PTA dose of Lovenox was 50 mg SQ daily (dose was adjusted at [facility] per LMWH levels) with last dose reported as 10/31 at 2100.  Today, 01/21/2015:  CBC: Hgb/ptlc stable .  No bleeding reported.  Goal of Therapy:  Monitor platelets by anticoagulation protocol: Yes   Plan:   Continue Lovenox '50mg'$  SQ daily (this dose is based off of monitoring of LMWH anti-Xa levels at [facility])  Monitor closely for s/s of bleeding  Minda Ditto PharmD Pager [phone] 01/21/2015, 10:41 AM

## 2015-01-21 NOTE — Progress Notes (Signed)
70cc of Fentanyl wasted in the sink with Benjamin Stain, RN. Luther Parody, RN

## 2015-01-21 NOTE — Progress Notes (Signed)
PULMONARY / CRITICAL CARE MEDICINE   Name: [name] [name] MRN: [id number] DOB: 1940/09/04    ADMISSION DATE:  01/15/2015 CONSULTATION DATE:  01/15/15  REFERRING MD :  ED  CHIEF COMPLAINT:  Aspiration, vent dependent respiratory failure, sepsis, atrial flutter.  INITIAL PRESENTATION: 74 yo female presented with respiratory failure, a flutter with RVR.  Has hx of Stage IV breast cancer.   STUDIES:  11/02 Echo >> EF 55 to 60%  SIGNIFICANT EVENTS: 11/1 Admit, amio/heparin gtt, phenylephrine, abx 11/2 off pressors, sinus rhythm 11/3 start lasix 11/4 wheezing >> added ICS 11/5 d/c amiodarone, cardiology sign off  SUBJECTIVE:  Afebrile denies pain C/o dyspnea on PS 15/5   VITAL SIGNS: Temp:  [98.4 F (36.9 C)-99.1 F (37.3 C)] 98.6 F (37 C) (11/07 0800) Pulse Rate:  [83-98] 87 (11/07 0805) Resp:  [8-29] 15 (11/07 0805) BP: (82-120)/(43-77) 95/58 mmHg (11/07 0805) SpO2:  [97 %-100 %] 98 % (11/07 0805) FiO2 (%):  [30 %] 30 % (11/07 0805) HEMODYNAMICS: CVP:  [7 mmHg-8 mmHg] 7 mmHg VENTILATOR SETTINGS: Vent Mode:  [-] PRVC FiO2 (%):  [30 %] 30 % Set Rate:  [15 bmp] 15 bmp Vt Set:  [400 mL] 400 mL PEEP:  [5 cmH20] 5 cmH20 Plateau Pressure:  [30 cmH20-35 cmH20] 32 cmH20 INTAKE / OUTPUT:  Intake/Output Summary (Last 24 hours) at 01/21/15 0859 Last data filed at 01/21/15 0800  Gross per 24 hour  Intake 2448.32 ml  Output    860 ml  Net 1588.32 ml    PHYSICAL EXAMINATION: General: pleasant, chr ill  HEENT: ETT in place PULM: b/l rhonchi CV: regular GI: non tender MSK: 1+ edema Derm: bruises extensor surfaces, ecchymosis Neuro: RASS 0  LABS:  CBC  Recent Labs Lab 01/19/15 0450 01/20/15 0546 01/21/15 0445  WBC 9.7 8.9 11.6*  HGB 9.5* 9.2* 9.2*  HCT 30.1* 29.5* 29.9*  PLT 414* 385 357   Coag's  Recent Labs Lab 01/15/15 1424 01/15/15 2115  APTT 35 26  INR 1.08 1.12   BMET  Recent Labs Lab 01/19/15 0450 01/20/15 0546 01/21/15 0445   NA 139 141 144  K 4.2 2.7* 2.7*  CL 96* 91* 93*  CO2 36* 43* 43*  BUN 22* 31* 30*  CREATININE 0.54 0.50 0.45  GLUCOSE 133* 165* 178*   Electrolytes  Recent Labs Lab 01/18/15 0510 01/19/15 0450 01/20/15 0546 01/21/15 0445  CALCIUM 8.2* 8.4* 8.4* 8.7*  MG 1.6* 1.8 1.6* 1.9  PHOS 1.8* 1.7*  --  2.8     Sepsis Markers  Recent Labs Lab 01/15/15 1850 01/15/15 1921  LATICACIDVEN 4.5* 4.97*  PROCALCITON 0.28  --    ABG  Recent Labs Lab 01/15/15 1851 01/16/15 0350  PHART 7.392 7.410  PCO2ART 47.1* 41.0  PO2ART 368* 154*   Liver Enzymes  Recent Labs Lab 01/15/15 1424 01/15/15 1850 01/19/15 0450  AST '20 24 17  '$ ALT 16 16 11*  ALKPHOS 107 99 92  BILITOT 0.4 0.8 0.7  ALBUMIN 3.2* 2.8* 2.2*   Cardiac Enzymes  Recent Labs Lab 01/15/15 1850 01/16/15 0027 01/16/15 0645  TROPONINI 0.05* 0.07* 0.06*   Glucose  Recent Labs Lab 01/20/15 1317 01/20/15 1623 01/20/15 1954 01/21/15 01/21/15 0414 01/21/15 0739  GLUCAP 196* 162* 105* 222* 161* 152*    Imaging Dg Chest Port 1 View  01/21/2015  CLINICAL DATA:  Respiratory failure, CHF, metastatic breast malignancy, history of asthma EXAM: PORTABLE CHEST 1 VIEW COMPARISON:  Portable chest x-ray of January 20, 2015 FINDINGS:  The lungs are adequately inflated. The interstitial markings remain coarse. Confluent alveolar opacity and small pleural effusion at the left lung base persist. There is soft tissue density at the right lung base laterally consistent with loculated pleural fluid. The heart and pulmonary vascularity are normal. The mediastinum is normal in width. The endotracheal tube tip lies 4.2 cm above the carina. The esophagogastric tube tip projects below the inferior margin of the image. The left internal jugular venous catheter tip projects over the midportion of the SVC. The left-sided Port-A-Cath appliance tip projects over the cavoatrial junction. IMPRESSION: Stable appearance of the chest. Persistent left  basilar atelectasis, loculated pleural fluid at the right lung base, and mild pulmonary vascular congestion. The support tubes are in reasonable position. Electronically Signed   By: David  Martinique M.D.   On: 01/21/2015 07:09   Dg Chest Port 1 View  01/20/2015  CLINICAL DATA:  74 year old female with history of respiratory failure. EXAM: PORTABLE CHEST 1 VIEW COMPARISON:  Multiple priors, most recently chest x-ray 01/19/2015. FINDINGS: An endotracheal tube is in place with tip 4.7 cm above the carina. Left internal jugular single-lumen porta cath with tip terminating in the distal superior vena cava. There is a left-sided internal jugular central venous catheter with tip terminating in the right atrium. A nasogastric tube is seen extending into the stomach, however, the tip of the nasogastric tube extends below the lower margin of the image. Moderate partially loculated right pleural effusion and small left pleural effusion appear unchanged. Bibasilar opacities may reflect areas of atelectasis and/or airspace consolidation. Coarse interstitial markings throughout the lungs bilaterally, similar to the prior study, with multiple bilateral pulmonary nodules which appear very similar. Heart size is upper limits of normal. Upper mediastinal contours are within normal limits. IMPRESSION: 1. Support apparatus, as above. 2. Radiographic appearance the chest is otherwise essentially unchanged, as detailed above. Electronically Signed   By: Vinnie Langton M.D.   On: 01/20/2015 07:12     ASSESSMENT / PLAN: PULMONARY ETT 11/1>> A: Acute respiratory failure 2nd to PNA (Aspiration vs HCAP) and pulmonary edema. Hx of PE from January 2016. Rt loculated pleural effusion >> likely malignant. Hx of asthma with wheezing. P:   Pressure support wean as tolerated >> baby steps  Continue pulmicort, atrovent, and add xopenex Diuresis as tolerated Tx dose lovenox  CARDIOVASCULAR Lt IJ CVL 11/1 >> Port >> A:  A  fib/flutter with RVR >> back in sinus rhythm. Septic shock >> resolved. P: D/c'ed amiodarone 11/05 Monitor hemodynamics  RENAL A:  Hypokalemia. P:   Replace as needed  GASTROINTESTINAL A:   Protein calorie malnutrition. P:   Continue tubefeeds Protonix for SUP  HEMATOLOGIC/ONCOLOGY A:   Stage IV breast cancer. P:  F/[facility] at [facility]   INFECTIOUS A:   Septic shock 2nd to PNA, UTI. P:   Day 7 of Abx, currently on levaquin  BCx2 11/1 >>ng Sputum 11/1 >>oral flora Urine 11/2 >> E coli  ENDOCRINE A:   Relative adrenal insufficiency, on chronic steroids as outpt. P:   Wean off solu cortef  NEUROLOGIC A:   Cancer pain. P:   RASS goal 0 PRN fentanyl  GOALS of CARE No CPR, No defibrillation.    Updated husband at bedside.  Explained that she is not making progress with vent weaning.  Reviewed option of tracheostomy and long term vent support >> they had not considered this previous.  I don't think tracheostomy will be in her best interest.  Summary - Best outcome would be one way extubation here -Will  discuss with family further depending on her progress with wean through this week. She was dyspneic at rest due to cancer prior to this ? Aspiration episode   The patient is critically ill with multiple organ systems failure and requires high complexity decision making for assessment and support, frequent evaluation and titration of therapies, application of advanced monitoring technologies and extensive interpretation of multiple databases. Critical Care Time devoted to patient care services described in this note independent of APP time is 35 minutes.   Rigoberto Noel MD  01/21/2015, 8:59 AM

## 2015-01-21 NOTE — Progress Notes (Signed)
Nutrition Follow-up  INTERVENTION:   -Continue Vital HP @ 55 mL/hr.   - Recommend 100 mL free water QID.   - This regime provides 100% of needs: 1320 kcal, 116 gm protein, and 1104 mL free water.   - RD team will continue to monitor for needs.   NUTRITION DIAGNOSIS:   Inadequate oral intake related to inability to eat as evidenced by NPO status.  Ongoing.  GOAL:   Patient will meet greater than or equal to 90% of their needs  Meeting.  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 She is being followed at [facility] and has failed multiple rounds of chemotherapy. She is currently getting palliative chemotherapy with Gemzar started on 10/04/14. Marland Kitchen  Patient is currently intubated on ventilator support MV: 6.5 L/min Temp (24hrs), Avg:98.9 F (37.2 C), Min:98.6 F (37 C), Max:99.1 F (37.3 C)  Propofol: none  Labs reviewed: CBGs: 152-222 Low K Elevated BUN Mg/Phos WNL  Diet Order:  Diet NPO time specified  Skin:  Reviewed, no issues  Last BM:  11/3  Height:   Ht Readings from Last 1 Encounters:  01/15/15 $RemoveB'5\' 2"'ZJCEMbDH$  (1.575 m)    Weight:   Wt Readings from Last 1 Encounters:  01/20/15 161 lb 6 oz (73.2 kg)    Ideal Body Weight:  50 kg  BMI:  Body mass index is 29.51 kg/(m^2).  Estimated Nutritional Needs:   Kcal:  1333  Protein:  100-110g  Fluid:  1.5L/day  EDUCATION NEEDS:   No education needs identified at this time  Clayton Bibles, MS, RD, LDN Pager: [phone] After Hours Pager: [phone]

## 2015-01-21 NOTE — Progress Notes (Signed)
eLink Physician-Brief Progress Note Patient Name: [name] [name] DOB: 08-24-1940 MRN: [id number]   Date of Service  01/21/2015  HPI/Events of Note  Hypokalemia in the setting of ongoing potassium replacement  eICU Interventions  Additional potassium ordered     Intervention Category Major Interventions: Electrolyte abnormality - evaluation and management  DETERDING,ELIZABETH 01/21/2015, 6:18 AM

## 2015-01-22 DIAGNOSIS — Z9911 Dependence on respirator [ventilator] status: Secondary | ICD-10-CM | POA: Insufficient documentation

## 2015-01-22 LAB — GLUCOSE, CAPILLARY
GLUCOSE-CAPILLARY: 116 mg/dL — AB (ref 65–99)
GLUCOSE-CAPILLARY: 88 mg/dL (ref 65–99)
Glucose-Capillary: 69 mg/dL (ref 65–99)
Glucose-Capillary: 144 mg/dL — ABNORMAL HIGH (ref 65–99)
Glucose-Capillary: 193 mg/dL — ABNORMAL HIGH (ref 65–99)
Glucose-Capillary: 102 mg/dL — ABNORMAL HIGH (ref 65–99)

## 2015-01-22 MED ORDER — AMIODARONE LOAD VIA INFUSION
150.0000 mg | Freq: Once | INTRAVENOUS | Status: AC
  Administered 2015-01-22: 150 mg via INTRAVENOUS
  Filled 2015-01-22: qty 83.34

## 2015-01-22 MED ORDER — PANTOPRAZOLE SODIUM 40 MG IV SOLR
40.0000 mg | INTRAVENOUS | Status: DC
  Administered 2015-01-22 – 2015-01-23 (×2): 40 mg via INTRAVENOUS
  Filled 2015-01-22 (×2): qty 40

## 2015-01-22 MED ORDER — AMIODARONE HCL IN DEXTROSE 360-4.14 MG/200ML-% IV SOLN
60.0000 mg/h | INTRAVENOUS | Status: AC
  Administered 2015-01-22 (×2): 60 mg/h via INTRAVENOUS
  Filled 2015-01-22 (×2): qty 200

## 2015-01-22 MED ORDER — METOPROLOL TARTRATE 1 MG/ML IV SOLN
2.5000 mg | INTRAVENOUS | Status: DC | PRN
  Administered 2015-01-22: 2.5 mg via INTRAVENOUS
  Administered 2015-01-24: 5 mg via INTRAVENOUS
  Filled 2015-01-22 (×2): qty 5

## 2015-01-22 MED ORDER — GI COCKTAIL ~~LOC~~
30.0000 mL | Freq: Once | ORAL | Status: AC
  Administered 2015-01-22: 30 mL
  Filled 2015-01-22: qty 30

## 2015-01-22 MED ORDER — DEXTROSE 50 % IV SOLN
INTRAVENOUS | Status: AC
  Administered 2015-01-22: 25 mL
  Filled 2015-01-22: qty 50

## 2015-01-22 MED ORDER — AMIODARONE HCL IN DEXTROSE 360-4.14 MG/200ML-% IV SOLN
30.0000 mg/h | INTRAVENOUS | Status: DC
Start: 2015-01-22 — End: 2015-01-27
  Administered 2015-01-22 – 2015-01-27 (×9): 30 mg/h via INTRAVENOUS
  Filled 2015-01-22 (×9): qty 200

## 2015-01-22 NOTE — Progress Notes (Signed)
PULMONARY / CRITICAL CARE MEDICINE   Name: [name] [name] MRN: [id number] DOB: 14-Sep-1940    ADMISSION DATE:  01/15/2015 CONSULTATION DATE:  01/15/15  REFERRING MD :  ED  CHIEF COMPLAINT:  Aspiration, vent dependent respiratory failure, sepsis, atrial flutter.  INITIAL PRESENTATION: 74 yo female presented with respiratory failure, a flutter with RVR.  Has hx of Stage IV breast cancer.   STUDIES:  11/02 Echo >> EF 55 to 60%  SIGNIFICANT EVENTS: 11/1 Admit, amio/heparin gtt, phenylephrine, abx 11/2 off pressors, sinus rhythm 11/3 start lasix 11/4 wheezing >> added ICS 11/5 d/c amiodarone, cardiology sign off 11/7 wean on 15/5  SUBJECTIVE:  Dry heaving overnight -TFs stopped Afebrile C/o epigastric pain last night HR high 150s this am    VITAL SIGNS: Temp:  [97 F (36.1 C)-99 F (37.2 C)] 98.6 F (37 C) (11/08 0800) Pulse Rate:  [84-99] 91 (11/08 0400) Resp:  [0-30] 15 (11/08 0800) BP: (87-140)/(47-77) 102/48 mmHg (11/08 0800) SpO2:  [93 %-100 %] 98 % (11/08 0800) FiO2 (%):  [30 %] 30 % (11/08 0800) Weight:  [75.1 kg (165 lb 9.1 oz)] 75.1 kg (165 lb 9.1 oz) (11/08 0500) HEMODYNAMICS:   VENTILATOR SETTINGS: Vent Mode:  [-] PRVC FiO2 (%):  [30 %] 30 % Set Rate:  [15 bmp] 15 bmp Vt Set:  [400 mL] 400 mL PEEP:  [5 cmH20] 5 cmH20 Plateau Pressure:  [29 cmH20-36 cmH20] 31 cmH20 INTAKE / OUTPUT:  Intake/Output Summary (Last 24 hours) at 01/22/15 0849 Last data filed at 01/22/15 0800  Gross per 24 hour  Intake   1150 ml  Output   2460 ml  Net  -1310 ml    PHYSICAL EXAMINATION: General: pleasant, chr ill , able to write HEENT: ETT in place PULM: b/l rhonchi CV: regular GI: non tender MSK: 1+ edema Derm: bruises extensor surfaces, ecchymosis Neuro: RASS 0, non focal, deconditioned  LABS:  CBC  Recent Labs Lab 01/19/15 0450 01/20/15 0546 01/21/15 0445  WBC 9.7 8.9 11.6*  HGB 9.5* 9.2* 9.2*  HCT 30.1* 29.5* 29.9*  PLT 414* 385 357    Coag's  Recent Labs Lab 01/15/15 1424 01/15/15 2115  APTT 35 26  INR 1.08 1.12   BMET  Recent Labs Lab 01/20/15 0546 01/21/15 0445 01/21/15 1814  NA 141 144 145  K 2.7* 2.7* 3.7  CL 91* 93* 97*  CO2 43* 43* 41*  BUN 31* 30* 28*  CREATININE 0.50 0.45 0.43*  GLUCOSE 165* 178* 170*   Electrolytes  Recent Labs Lab 01/18/15 0510 01/19/15 0450 01/20/15 0546 01/21/15 0445 01/21/15 1814  CALCIUM 8.2* 8.4* 8.4* 8.7* 8.8*  MG 1.6* 1.8 1.6* 1.9  --   PHOS 1.8* 1.7*  --  2.8  --      Sepsis Markers  Recent Labs Lab 01/15/15 1850 01/15/15 1921  LATICACIDVEN 4.5* 4.97*  PROCALCITON 0.28  --    ABG  Recent Labs Lab 01/15/15 1851 01/16/15 0350  PHART 7.392 7.410  PCO2ART 47.1* 41.0  PO2ART 368* 154*   Liver Enzymes  Recent Labs Lab 01/15/15 1424 01/15/15 1850 01/19/15 0450  AST '20 24 17  '$ ALT 16 16 11*  ALKPHOS 107 99 92  BILITOT 0.4 0.8 0.7  ALBUMIN 3.2* 2.8* 2.2*   Cardiac Enzymes  Recent Labs Lab 01/15/15 1850 01/16/15 0027 01/16/15 0645  TROPONINI 0.05* 0.07* 0.06*   Glucose  Recent Labs Lab 01/21/15 1617 01/21/15 1959 01/21/15 2311 01/22/15 0308 01/22/15 0412 01/22/15 0743  GLUCAP 176* 120* 154*  51 144* 116*    Imaging Dg Chest Port 1 View  01/21/2015  CLINICAL DATA:  Respiratory failure, CHF, metastatic breast malignancy, history of asthma EXAM: PORTABLE CHEST 1 VIEW COMPARISON:  Portable chest x-ray of January 20, 2015 FINDINGS: The lungs are adequately inflated. The interstitial markings remain coarse. Confluent alveolar opacity and small pleural effusion at the left lung base persist. There is soft tissue density at the right lung base laterally consistent with loculated pleural fluid. The heart and pulmonary vascularity are normal. The mediastinum is normal in width. The endotracheal tube tip lies 4.2 cm above the carina. The esophagogastric tube tip projects below the inferior margin of the image. The left internal jugular  venous catheter tip projects over the midportion of the SVC. The left-sided Port-A-Cath appliance tip projects over the cavoatrial junction. IMPRESSION: Stable appearance of the chest. Persistent left basilar atelectasis, loculated pleural fluid at the right lung base, and mild pulmonary vascular congestion. The support tubes are in reasonable position. Electronically Signed   By: David  Martinique M.D.   On: 01/21/2015 07:09     ASSESSMENT / PLAN: PULMONARY ETT 11/1>> A: Acute respiratory failure 2nd to PNA (Aspiration vs HCAP) and pulmonary edema. Hx of PE from January 2016. Rt loculated pleural effusion >> likely malignant. Hx of asthma with wheezing. P:   Pressure support wean as tolerated >> once HR better controlled Continue pulmicort, atrovent, and add xopenex Diuresis as tolerated Tx dose lovenox  CARDIOVASCULAR Lt IJ CVL 11/1 >> Port >> A:  A fib/flutter with RVR >> back in sinus rhythm. Septic shock >> resolved. P: D/c'ed amiodarone 11/05 Lopressor 2.5- 5 q3h prn for rate control  RENAL A:  Hypokalemia. P:   Replace lytes as needed  GASTROINTESTINAL A:   Protein calorie malnutrition. Nausea P:   Zofran prn  Hold  tubefeeds x 24h Protonix for SUP  HEMATOLOGIC/ONCOLOGY A:   Stage IV breast cancer. P:  F/u at [facility]   INFECTIOUS A:   Septic shock 2nd to PNA, UTI. P:   Day 78/10 of   levaquin  BCx2 11/1 >>ng Sputum 11/1 >>oral flora Urine 11/2 >> E coli  ENDOCRINE A:   Relative adrenal insufficiency, on chronic steroids as outpt. P:   Stress dose solucortef  NEUROLOGIC A:   Cancer pain. P:   RASS goal 0  fentanyl gtt  GOALS of CARE No CPR, No defibrillation.    Updated husband at bedside.  Explained that she is not making progress with vent weaning.  Reviewed option of tracheostomy and long term vent support >>he will consider. I don't think tracheostomy will be in her best interest.    Summary - Weaning today precluded by AF-RVR Best  outcome would be one way extubation here -Will  discuss with family further depending on her progress with wean through this week. She was dyspneic at rest due to cancer prior to this ? Aspiration episode   The patient is critically ill with multiple organ systems failure and requires high complexity decision making for assessment and support, frequent evaluation and titration of therapies, application of advanced monitoring technologies and extensive interpretation of multiple databases. Critical Care Time devoted to patient care services described in this note independent of APP time is 32 minutes.   Rigoberto Noel MD  01/22/2015, 8:49 AM

## 2015-01-22 NOTE — Progress Notes (Signed)
Long discussion w/ pt and husband. She still is struggling with goals of care. She understands the severity of her current situation.  We discussed:  -Prolonged ventilation/trach-->LTACH and even possibly home with vent if can't be weaned-->made it clear chemo off table at that point and although this would give her more time; there is a real chance of suffering and discomfort.   -working towards terminal extubation (understanding that 2 weeks is as long as we would want to keep with oral ventilation which would be next week). Described to her the process of terminal wean including medications for pain and anxiety and the possibility that doses high enough to sedate her could potentially be needed to get her off the vent and keep her comfortable.   All questions answered. The patient wants some time specifically to talk to her children. We will continue our current care at this point.   1 hour time   Erick Colace ACNP-BC [place] Pager # [phone] OR # [phone] if no answer

## 2015-01-22 NOTE — Progress Notes (Signed)
Nutrition Follow-up  INTERVENTION:   Once medically able, restart Vital HP @ 35 ml/hr and advance to goal rate after 4 hours to 55 ml/hr.  Recommend 100 mL free water QID.   TF regimen provides 1320 kcal, 116 gm protein, and 1104 mL free water.   - RD team will continue to monitor for needs.   NUTRITION DIAGNOSIS:   Inadequate oral intake related to inability to eat as evidenced by NPO status.  Ongoing.  GOAL:   Patient will meet greater than or equal to 90% of their needs  Not meeting, TF stopped.  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 She is being followed at [facility] and has failed multiple rounds of chemotherapy. She is currently getting palliative chemotherapy with Gemzar started on 10/04/14. .  TF has been stopped for 24 hours d/t pt with N/V.   Patient is currently intubated on ventilator support MV: 6.3 L/min Temp (24hrs), Avg:98.7 F (37.1 C), Min:97 F (36.1 C), Max:99.5 F (37.5 C)  Propofol: none  Labs reviewed: CBGs: 116-193 Elevated BUN Low creatinine  Diet Order:  Diet NPO time specified  Skin:  Reviewed, no issues  Last BM:  11/7  Height:   Ht Readings from Last 1 Encounters:  01/15/15 $RemoveB'5\' 2"'XLZFtBrG$  (1.575 m)    Weight:   Wt Readings from Last 1 Encounters:  01/22/15 165 lb 9.1 oz (75.1 kg)    Ideal Body Weight:  50 kg  BMI:  Body mass index is 30.27 kg/(m^2).  Estimated Nutritional Needs:   Kcal:  1412  Protein:  105-115g  Fluid:  1.5L/day  EDUCATION NEEDS:   No education needs identified at this time  Clayton Bibles, MS, RD, LDN Pager: [phone] After Hours Pager: [phone]

## 2015-01-22 NOTE — Plan of Care (Signed)
Problem: Phase I Progression Outcomes Goal: Patient tolerating nututrition at goal Outcome: Not Progressing Pt became very nauseous last night with feeds going.  Feeds have since been stopped, and all PO meds have been changed to IV.  Feeds will be held for today, and readdressed tomorrow.

## 2015-01-22 NOTE — Progress Notes (Signed)
Hypoglycemic Event  CBG: 69  Treatment: D50 IV 25 mL  Symptoms: None  Follow-up CBG: [id number] CBG Result:144  Possible Reasons for Event: Other: tube feedings stopped due to nausea and vomiting  Comments/MD notified:hypoglycemia was resolved    [name] [name], Coralee Pesa

## 2015-01-22 NOTE — Progress Notes (Signed)
  Amiodarone Drug - Drug Interaction Consult Note  Recommendations: Currently on Day 5 of Levaquin from 11/4, Day 8/10 abx Qtc intervals 0.4 and 0.5 on 11/6, plan to monitor daily Qtc, continue Levaquin for now  Amiodarone is metabolized by the cytochrome P450 system and therefore has the potential to cause many drug interactions. Amiodarone has an average plasma half-life of 50 days (range 20 to 100 days).   There is potential for drug interactions to occur several weeks or months after stopping treatment and the onset of drug interactions may be slow after initiating amiodarone.   '[]'$  Statins: Increased risk of myopathy. Simvastatin- restrict dose to '20mg'$  daily. Other statins: counsel patients to report any muscle pain or weakness immediately.  '[]'$  Anticoagulants: Amiodarone can increase anticoagulant effect. Consider warfarin dose reduction. Patients should be monitored closely and the dose of anticoagulant altered accordingly, remembering that amiodarone levels take several weeks to stabilize.  '[]'$  Antiepileptics: Amiodarone can increase plasma concentration of phenytoin, the dose should be reduced. Note that small changes in phenytoin dose can result in large changes in levels. Monitor patient and counsel on signs of toxicity.  '[]'$  Beta blockers: increased risk of bradycardia, AV block and myocardial depression. Sotalol - avoid concomitant use.  '[]'$   Calcium channel blockers (diltiazem and verapamil): increased risk of bradycardia, AV block and myocardial depression.  '[]'$   Cyclosporine: Amiodarone increases levels of cyclosporine. Reduced dose of cyclosporine is recommended.  '[]'$  Digoxin dose should be halved when amiodarone is started.  '[]'$  Diuretics: increased risk of cardiotoxicity if hypokalemia occurs.  '[]'$  Oral hypoglycemic agents (glyburide, glipizide, glimepiride): increased risk of hypoglycemia. Patient's glucose levels should be monitored closely when initiating amiodarone therapy.    '[x]'$  Drugs that prolong the QT interval:  Torsades de pointes risk may be increased with concurrent use - avoid if possible.  Monitor QTc, also keep magnesium/potassium WNL if concurrent therapy can't be avoided. Marland Kitchen Antibiotics: e.g. fluoroquinolones, erythromycin. . Antiarrhythmics: e.g. quinidine, procainamide, disopyramide, sotalol. . Antipsychotics: e.g. phenothiazines, haloperidol.  . Lithium, tricyclic antidepressants, and methadone.  Thank You,  Graylin Shiver L  01/22/2015 11:02 AM

## 2015-01-23 ENCOUNTER — Encounter (HOSPITAL_COMMUNITY): Payer: Self-pay | Admitting: Radiology

## 2015-01-23 ENCOUNTER — Inpatient Hospital Stay (HOSPITAL_COMMUNITY): Payer: Medicare Other

## 2015-01-23 LAB — BASIC METABOLIC PANEL
Anion gap: 7 (ref 5–15)
BUN: 25 mg/dL — ABNORMAL HIGH (ref 6–20)
CHLORIDE: 101 mmol/L (ref 101–111)
CO2: 37 mmol/L — AB (ref 22–32)
CREATININE: 0.47 mg/dL (ref 0.44–1.00)
Calcium: 8.7 mg/dL — ABNORMAL LOW (ref 8.9–10.3)
GFR calc non Af Amer: >60 mL/min (ref >60)
GLUCOSE: 122 mg/dL — AB (ref 65–99)
Potassium: 3.4 mmol/L — ABNORMAL LOW (ref 3.5–5.1)
Sodium: 145 mmol/L (ref 135–145)

## 2015-01-23 LAB — GLUCOSE, CAPILLARY
GLUCOSE-CAPILLARY: 112 mg/dL — AB (ref 65–99)
GLUCOSE-CAPILLARY: 137 mg/dL — AB (ref 65–99)
Glucose-Capillary: 107 mg/dL — ABNORMAL HIGH (ref 65–99)
Glucose-Capillary: 130 mg/dL — ABNORMAL HIGH (ref 65–99)
Glucose-Capillary: 133 mg/dL — ABNORMAL HIGH (ref 65–99)

## 2015-01-23 LAB — CBC
HCT: 29.5 % — ABNORMAL LOW (ref 36.0–46.0)
HEMOGLOBIN: 8.9 g/dL — AB (ref 12.0–15.0)
MCH: 30.8 pg (ref 26.0–34.0)
MCHC: 30.2 g/dL (ref 30.0–36.0)
MCV: 102.1 fL — AB (ref 78.0–100.0)
PLATELETS: 297 10*3/uL (ref 150–400)
RBC: 2.89 MIL/uL — AB (ref 3.87–5.11)
RDW: 22.5 % — ABNORMAL HIGH (ref 11.5–15.5)
WBC: 10.3 10*3/uL (ref 4.0–10.5)

## 2015-01-23 LAB — PHOSPHORUS: Phosphorus: 4 mg/dL (ref 2.5–4.6)

## 2015-01-23 LAB — MAGNESIUM: Magnesium: 1.8 mg/dL (ref 1.7–2.4)

## 2015-01-23 MED ORDER — METOCLOPRAMIDE HCL 5 MG/ML IJ SOLN
10.0000 mg | Freq: Three times a day (TID) | INTRAMUSCULAR | Status: AC
  Administered 2015-01-23 – 2015-01-25 (×6): 10 mg via INTRAVENOUS
  Filled 2015-01-23 (×6): qty 2

## 2015-01-23 MED ORDER — PHENYLEPHRINE HCL 10 MG/ML IJ SOLN
30.0000 ug/min | INTRAVENOUS | Status: AC
  Administered 2015-01-23: 30 ug/min via INTRAVENOUS
  Administered 2015-01-23: 40 ug/min via INTRAVENOUS
  Administered 2015-01-25: 30 ug/min via INTRAVENOUS
  Administered 2015-01-25: 25 ug/min via INTRAVENOUS
  Administered 2015-01-25 – 2015-01-26 (×2): 20 ug/min via INTRAVENOUS
  Administered 2015-01-26: 25 ug/min via INTRAVENOUS
  Administered 2015-01-26: 20 ug/min via INTRAVENOUS
  Administered 2015-01-27: 25 ug/min via INTRAVENOUS
  Administered 2015-01-27: 30 ug/min via INTRAVENOUS
  Administered 2015-01-28: 50 ug/min via INTRAVENOUS
  Administered 2015-01-28: 45 ug/min via INTRAVENOUS
  Filled 2015-01-23 (×18): qty 1

## 2015-01-23 MED ORDER — SODIUM CHLORIDE 0.9 % IV BOLUS (SEPSIS)
250.0000 mL | INTRAVENOUS | Status: AC
  Administered 2015-01-23: 250 mL via INTRAVENOUS

## 2015-01-23 NOTE — Progress Notes (Signed)
Patient transported on 100% FiO2 and full support to CT. Patient remained stable throughout transport to and from CT. No complications are noted at this time and patient is resting comfortably. Patient is currently on full support and vitals are stable. RT will continue to monitor patient.

## 2015-01-23 NOTE — Progress Notes (Signed)
PULMONARY / CRITICAL CARE MEDICINE   Name: [name] [name] MRN: [id number] DOB: 1941-02-13    ADMISSION DATE:  01/15/2015 CONSULTATION DATE:  01/15/15  REFERRING MD :  ED  CHIEF COMPLAINT:  Aspiration, vent dependent respiratory failure, sepsis, atrial flutter.  INITIAL PRESENTATION: 74 yo female presented with respiratory failure, a flutter with RVR.  Has hx of Stage IV breast cancer.   STUDIES:  11/02 Echo >> EF 55 to 60%  SIGNIFICANT EVENTS: 11/1 Admit, amio/heparin gtt, phenylephrine, abx 11/2 off pressors, sinus rhythm 11/3 start lasix 11/4 wheezing >> added ICS 11/5 d/c amiodarone, cardiology sign off 11/7 wean on 15/5 11/8 Af-RVR - amio gtt  SUBJECTIVE:  Afebrile Continues to C/o epigastric pain last night HR improved with amio  VITAL SIGNS: Temp:  [99 F (37.2 C)-99.7 F (37.6 C)] 99.1 F (37.3 C) (11/09 1000) Pulse Rate:  [86-106] 106 (11/09 1121) Resp:  [10-26] 21 (11/09 1121) BP: (71-146)/(34-80) 141/61 mmHg (11/09 1121) SpO2:  [96 %-100 %] 100 % (11/09 1121) FiO2 (%):  [30 %] 30 % (11/09 1121) Weight:  [78.8 kg (173 lb 11.6 oz)] 78.8 kg (173 lb 11.6 oz) (11/09 0600) HEMODYNAMICS:   VENTILATOR SETTINGS: Vent Mode:  [-] PRVC FiO2 (%):  [30 %] 30 % Set Rate:  [15 bmp] 15 bmp Vt Set:  [400 mL] 400 mL PEEP:  [5 cmH20] 5 cmH20 Pressure Support:  [15 cmH20-18 cmH20] 15 cmH20 Plateau Pressure:  [21 cmH20-30 cmH20] 28 cmH20 INTAKE / OUTPUT:  Intake/Output Summary (Last 24 hours) at 01/23/15 1236 Last data filed at 01/23/15 1000  Gross per 24 hour  Intake 2027.37 ml  Output   1105 ml  Net 922.37 ml    PHYSICAL EXAMINATION: General: pleasant, chr ill , able to write HEENT: ETT in place PULM: b/l decreased  CV: regular GI: non tender MSK: 1+ edema Derm: bruises extensor surfaces, ecchymosis Neuro: RASS 0, non focal, deconditioned  LABS:  CBC  Recent Labs Lab 01/20/15 0546 01/21/15 0445 01/23/15 0608  WBC 8.9 11.6* 10.3  HGB 9.2*  9.2* 8.9*  HCT 29.5* 29.9* 29.5*  PLT 385 357 297   Coag's No results for input(s): APTT, INR in the last 168 hours. BMET  Recent Labs Lab 01/21/15 0445 01/21/15 1814 01/23/15 0608  NA 144 145 145  K 2.7* 3.7 3.4*  CL 93* 97* 101  CO2 43* 41* 37*  BUN 30* 28* 25*  CREATININE 0.45 0.43* 0.47  GLUCOSE 178* 170* 122*   Electrolytes  Recent Labs Lab 01/19/15 0450 01/20/15 0546 01/21/15 0445 01/21/15 1814 01/23/15 0608  CALCIUM 8.4* 8.4* 8.7* 8.8* 8.7*  MG 1.8 1.6* 1.9  --  1.8  PHOS 1.7*  --  2.8  --  4.0     Sepsis Markers No results for input(s): LATICACIDVEN, PROCALCITON, O2SATVEN in the last 168 hours. ABG No results for input(s): PHART, PCO2ART, PO2ART in the last 168 hours. Liver Enzymes  Recent Labs Lab 01/19/15 0450  AST 17  ALT 11*  ALKPHOS 92  BILITOT 0.7  ALBUMIN 2.2*   Cardiac Enzymes No results for input(s): TROPONINI, PROBNP in the last 168 hours. Glucose  Recent Labs Lab 01/22/15 1703 01/22/15 1950 01/22/15 2353 01/23/15 0404 01/23/15 0725 01/23/15 1208  GLUCAP 102* 88 130* 133* 107* 112*    Imaging Dg Chest Port 1 View  01/23/2015  CLINICAL DATA:  Respiratory failure EXAM: PORTABLE CHEST 1 VIEW COMPARISON:  Radiograph 01/21/2015, 07/03/2014 CT FINDINGS: Endotracheal tube and LEFT central venous line are  unchanged. Port in the LEFT chest wall. Loculated effusion at the RIGHT lateral lung base is unchanged. Bibasilar atelectasis is unchanged. Nodular densities at the lung bases again demonstrated. IMPRESSION: 1. Stable support apparatus. 2. Bilateral lower lobe pulmonary nodules may be increased from CT 07/03/2014 3. Loculated effusion on the RIGHT. Electronically Signed   By: Suzy Bouchard M.D.   On: 01/23/2015 07:09     ASSESSMENT / PLAN: PULMONARY ETT 11/1>> A: Acute respiratory failure 2nd to PNA (Aspiration vs HCAP) and pulmonary edema. Hx of PE from January 2016. Rt loculated pleural effusion >> likely malignant, present  since 10/2014 Hx of asthma with wheezing. Records from [place] reviewed 10/2014-bronchoscopy negative for infectious etiology P:   Pressure support wean as tolerated >> now that  HR better controlled Continue pulmicort, atrovent, and add xopenex Diuresis as tolerated Tx dose lovenox  CARDIOVASCULAR Lt IJ CVL 11/1 >> Port >> A:  A fib/flutter with RVR  Septic shock >> resolved. P: D/c'ed amiodarone 11/05, resumed 11/8 Lopressor 2.5- 5 q3h prn for rate control  RENAL A:  Hypokalemia. P:   Replace lytes as needed  GASTROINTESTINAL A:   Protein calorie malnutrition. Nausea P:   Zofran prn  Resume tube feeds at lower dose Reglan 10 every 86 doses Protonix for SUP  HEMATOLOGIC/ONCOLOGY A:   Stage IV breast cancer. P:  F/u at [facility] , discussed with Dr. Rip Harbour at [place]:   Septic shock 2nd to PNA, UTI. P:   Day 9/10 of   levaquin  BCx2 11/1 >>ng Sputum 11/1 >>oral flora Urine 11/2 >> E coli  ENDOCRINE A:   Relative adrenal insufficiency, on chronic steroids as outpt. P:   Stress dose solucortef  NEUROLOGIC A:   Cancer pain. P:   RASS goal 0  fentanyl gtt  GOALS of CARE No CPR, No defibrillation.    Updated husband at bedside.  Explained that she is not making progress with vent weaning.  Reviewed option of tracheostomy and long term vent support >>he will consider. I don't think tracheostomy will be in her best interest.  Discussed regarding [facility] transfer  Summary - resume weaning Best outcome would be one way extubation here -Will  discuss with family further depending on her progress with wean through this week. She was dyspneic at rest due to cancer prior to this ? Aspiration episode   The patient is critically ill with multiple organ systems failure and requires high complexity decision making for assessment and support, frequent evaluation and titration of therapies, application of advanced monitoring technologies and extensive  interpretation of multiple databases. Critical Care Time devoted to patient care services described in this note independent of APP time is 32 minutes.   Rigoberto Noel MD  01/23/2015, 12:36 PM

## 2015-01-24 ENCOUNTER — Encounter: Payer: Self-pay | Admitting: Adult Health

## 2015-01-24 ENCOUNTER — Inpatient Hospital Stay (HOSPITAL_COMMUNITY): Payer: Medicare Other

## 2015-01-24 DIAGNOSIS — Z515 Encounter for palliative care: Secondary | ICD-10-CM | POA: Insufficient documentation

## 2015-01-24 LAB — GLUCOSE, CAPILLARY
GLUCOSE-CAPILLARY: 156 mg/dL — AB (ref 65–99)
GLUCOSE-CAPILLARY: 177 mg/dL — AB (ref 65–99)
Glucose-Capillary: 129 mg/dL — ABNORMAL HIGH (ref 65–99)
Glucose-Capillary: 162 mg/dL — ABNORMAL HIGH (ref 65–99)
Glucose-Capillary: 149 mg/dL — ABNORMAL HIGH (ref 65–99)
Glucose-Capillary: 123 mg/dL — ABNORMAL HIGH (ref 65–99)
Glucose-Capillary: 102 mg/dL — ABNORMAL HIGH (ref 65–99)
Glucose-Capillary: 143 mg/dL — ABNORMAL HIGH (ref 65–99)

## 2015-01-24 LAB — BASIC METABOLIC PANEL
ANION GAP: 6 (ref 5–15)
BUN: 22 mg/dL — AB (ref 6–20)
CO2: 38 mmol/L — AB (ref 22–32)
Calcium: 8.5 mg/dL — ABNORMAL LOW (ref 8.9–10.3)
Chloride: 97 mmol/L — ABNORMAL LOW (ref 101–111)
Creatinine, Ser: 0.43 mg/dL — ABNORMAL LOW (ref 0.44–1.00)
GFR calc Af Amer: >60 mL/min (ref >60)
GFR calc non Af Amer: >60 mL/min (ref >60)
GLUCOSE: 157 mg/dL — AB (ref 65–99)
POTASSIUM: 3 mmol/L — AB (ref 3.5–5.1)
Sodium: 141 mmol/L (ref 135–145)

## 2015-01-24 LAB — CBC
HEMATOCRIT: 30.6 % — AB (ref 36.0–46.0)
Hemoglobin: 9.2 g/dL — ABNORMAL LOW (ref 12.0–15.0)
MCH: 30 pg (ref 26.0–34.0)
MCHC: 30.1 g/dL (ref 30.0–36.0)
MCV: 99.7 fL (ref 78.0–100.0)
Platelets: 325 10*3/uL (ref 150–400)
RBC: 3.07 MIL/uL — ABNORMAL LOW (ref 3.87–5.11)
RDW: 21.5 % — AB (ref 11.5–15.5)
WBC: 15.1 10*3/uL — ABNORMAL HIGH (ref 4.0–10.5)

## 2015-01-24 LAB — PHOSPHORUS: Phosphorus: 3 mg/dL (ref 2.5–4.6)

## 2015-01-24 LAB — MAGNESIUM: Magnesium: 1.6 mg/dL — ABNORMAL LOW (ref 1.7–2.4)

## 2015-01-24 MED ORDER — LEVOFLOXACIN 25 MG/ML PO SOLN
750.0000 mg | Freq: Every day | ORAL | Status: DC
  Filled 2015-01-24: qty 30

## 2015-01-24 MED ORDER — POTASSIUM CHLORIDE 20 MEQ/15ML (10%) PO SOLN
40.0000 meq | Freq: Once | ORAL | Status: AC
  Administered 2015-01-24: 40 meq
  Filled 2015-01-24: qty 30

## 2015-01-24 MED ORDER — PANTOPRAZOLE SODIUM 40 MG PO PACK
40.0000 mg | PACK | Freq: Every day | ORAL | Status: DC
  Administered 2015-01-24 – 2015-01-27 (×4): 40 mg
  Filled 2015-01-24 (×5): qty 20

## 2015-01-24 MED ORDER — LEVOFLOXACIN 750 MG PO TABS
750.0000 mg | ORAL_TABLET | Freq: Every day | ORAL | Status: DC
  Administered 2015-01-24 – 2015-01-25 (×2): 750 mg
  Filled 2015-01-24 (×2): qty 1

## 2015-01-24 MED ORDER — SODIUM CHLORIDE 0.9 % IV SOLN
INTRAVENOUS | Status: DC
  Administered 2015-01-24: 02:00:00 via INTRAVENOUS

## 2015-01-24 NOTE — Progress Notes (Signed)
  Date: November10, 2016 Chart reviewed for concurrent status and case management needs. Will continue to follow patient for changes and needs: Remains on full vent support/faqiled weaning this am. Velva Harman, RN, BSN, CCM   [phone]

## 2015-01-24 NOTE — Progress Notes (Signed)
Nutrition Follow-up  INTERVENTION:   Continue Vital HP @ 20 ml/hr. If within [place], recommend advance to goal rate of 55 ml/hr.  Recommend 100 mL free water QID.   TF regimen provides 1320 kcal, 116 gm protein, and 1104 mL free water.   - RD team will continue to monitor for needs.   NUTRITION DIAGNOSIS:   Inadequate oral intake related to inability to eat as evidenced by NPO status.  Ongoing.  GOAL:   Patient will meet greater than or equal to 90% of their needs  Not meeting.  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 She is being followed at [facility] and has failed multiple rounds of chemotherapy. She is currently getting palliative chemotherapy with Gemzar started on 10/04/14. .  TF resumed 11/09, Vital HP @ 20 ml/hr, no plans to advance yet.  GOC in discussion.  Patient is currently intubated on ventilator support MV: 6.2 L/min Temp (24hrs), Avg:99.2 F (37.3 C), Min:98.4 F (36.9 C), Max:99.7 F (37.6 C)  Propofol: none  Labs reviewed: CBGs: 123-162 Low K, Creatinine, Mg  Elevated BUN  Diet Order:  Diet NPO time specified  Skin:  Reviewed, no issues  Last BM:  11/7  Height:   Ht Readings from Last 1 Encounters:  01/23/15 $RemoveB'5\' 2"'XMoRQBFM$  (1.575 m)    Weight:   Wt Readings from Last 1 Encounters:  01/24/15 173 lb 11.6 oz (78.8 kg)    Ideal Body Weight:  50 kg  BMI:  Body mass index is 31.77 kg/(m^2).  Estimated Nutritional Needs:   Kcal:  0044  Protein:  110-120g  Fluid:  1.5L/day  EDUCATION NEEDS:   No education needs identified at this time  Clayton Bibles, MS, RD, LDN Pager: [phone] After Hours Pager: [phone]

## 2015-01-24 NOTE — Progress Notes (Signed)
PULMONARY / CRITICAL CARE MEDICINE   Name: [name] [name] MRN: [id number] DOB: [birth year]    ADMISSION DATE:  01/15/2015 CONSULTATION DATE:  01/15/15  REFERRING MD :  ED  CHIEF COMPLAINT:  Aspiration, vent dependent respiratory failure, sepsis, atrial flutter.  INITIAL PRESENTATION: 74 yo female presented with respiratory failure, a flutter with RVR.  Has hx of Stage IV breast cancer.   STUDIES:  11/02 Echo >> EF 55 to 60% 11/9 CT chest >> Interval progression of disease - significant enlargement of numerous pulmonary nodules and masses, and interval increase in size and number of numerous pleural based nodules and masses throughout the lungs bilaterally, some of which demonstrate direct chest wall invasion, including direct bony invasion, multiple other lytic osseous metastases in the visualized skeleton.  SIGNIFICANT EVENTS: 11/1 Admit, amio/heparin gtt, phenylephrine, abx 11/2 off pressors, sinus rhythm 11/3 start lasix 11/4 wheezing >> added ICS 11/5 d/c amiodarone, cardiology sign off 11/7 wean on 15/5 11/8 Af-RVR - amio gtt -nSR  SUBJECTIVE:  Afebrile Improved epigastric pain  HR improved , decreased nausea  VITAL SIGNS: Temp:  [98.4 F (36.9 C)-99.7 F (37.6 C)] 99 F (37.2 C) (11/10 0700) Pulse Rate:  [76-106] 88 (11/10 0750) Resp:  [10-28] 15 (11/10 0750) BP: (79-157)/(34-86) 157/86 mmHg (11/10 0750) SpO2:  [98 %-100 %] 99 % (11/10 0700) FiO2 (%):  [30 %] 30 % (11/10 0750) Weight:  [78.8 kg (173 lb 11.6 oz)] 78.8 kg (173 lb 11.6 oz) (11/10 0600) HEMODYNAMICS: CVP:  [11 mmHg-12 mmHg] 11 mmHg VENTILATOR SETTINGS: Vent Mode:  [-] PSV;CPAP FiO2 (%):  [30 %] 30 % Set Rate:  [15 bmp] 15 bmp Vt Set:  [400 mL] 400 mL PEEP:  [5 cmH20] 5 cmH20 Pressure Support:  [15 cmH20] 15 cmH20 Plateau Pressure:  [27 cmH20-30 cmH20] 30 cmH20 INTAKE / OUTPUT:  Intake/Output Summary (Last 24 hours) at 01/24/15 0835 Last data filed at 01/24/15 0748  Gross per 24 hour   Intake 2063.32 ml  Output   1285 ml  Net 778.32 ml    PHYSICAL EXAMINATION: General: pleasant, chr ill , able to write HEENT: ETT in place PULM: b/l decreased  CV: regular GI: non tender MSK: 1+ edema Derm: bruises extensor surfaces, ecchymosis Neuro: RASS 0, non focal, deconditioned  LABS:  CBC  Recent Labs Lab 01/21/15 0445 01/23/15 0608 01/24/15 0530  WBC 11.6* 10.3 15.1*  HGB 9.2* 8.9* 9.2*  HCT 29.9* 29.5* 30.6*  PLT 357 297 325   Coag's No results for input(s): APTT, INR in the last 168 hours. BMET  Recent Labs Lab 01/21/15 1814 01/23/15 0608 01/24/15 0530  NA 145 145 141  K 3.7 3.4* 3.0*  CL 97* 101 97*  CO2 41* 37* 38*  BUN 28* 25* 22*  CREATININE 0.43* 0.47 0.43*  GLUCOSE 170* 122* 157*   Electrolytes  Recent Labs Lab 01/21/15 0445 01/21/15 1814 01/23/15 0608 01/24/15 0530  CALCIUM 8.7* 8.8* 8.7* 8.5*  MG 1.9  --  1.8 1.6*  PHOS 2.8  --  4.0 3.0     Sepsis Markers No results for input(s): LATICACIDVEN, PROCALCITON, O2SATVEN in the last 168 hours. ABG No results for input(s): PHART, PCO2ART, PO2ART in the last 168 hours. Liver Enzymes  Recent Labs Lab 01/19/15 0450  AST 17  ALT 11*  ALKPHOS 92  BILITOT 0.7  ALBUMIN 2.2*   Cardiac Enzymes No results for input(s): TROPONINI, PROBNP in the last 168 hours. Glucose  Recent Labs Lab 01/23/15 0404 01/23/15 0725 01/23/15  1208 01/23/15 1928 01/23/15 2306 01/24/15 0749  GLUCAP 133* 107* 112* 137* 162* 123*    Imaging Ct Chest Wo Contrast  01/23/2015  CLINICAL DATA:  74 year old female with history of metastatic breast cancer. Respiratory failure. Possible aspiration. EXAM: CT CHEST WITHOUT CONTRAST TECHNIQUE: Multidetector CT imaging of the chest was performed following the standard protocol without IV contrast. COMPARISON:  Chest CT 07/03/2014. FINDINGS: Mediastinum/Lymph Nodes: Tracheostomy tube in position with tip 3.9 cm above the carina. Nasogastric tube extends into the  stomach (tip is below the lower margin of the images). Left-sided single-lumen internal jugular Port-A-Cath with tip terminating in the right atrium. Left internal jugular central venous catheter with tip terminating at the superior cavoatrial junction. Heart size is normal. There is no significant pericardial fluid, thickening or pericardial calcification. There is atherosclerosis of the thoracic aorta, the great vessels of the mediastinum and the coronary arteries, including calcified atherosclerotic plaque in the right coronary artery. Mildly enlarged right hilar lymph node measuring 1 cm in short axis, similar to the prior examination. No other definite enlarged mediastinal or left hilar lymph nodes. Esophagus is unremarkable in appearance. No axillary lymphadenopathy. Lungs/Pleura: Interval development of a moderate partially loculated right pleural effusion which appears to be malignant, as evidenced by extensive areas of right-sided pleural thickening and nodularity, most notable for a pleural-based mass in the inferior aspect of the right hemithorax measuring 4.7 x 2.5 cm (previously 1.9 x 1.2 cm on prior study 06/23/2014). Multiple left-sided pulmonary and pleural nodules also noted, largest of which is in the medial aspect of the left lower lobe (image 38 of series 2) measuring 2.9 x 2.8 cm (previously 1.7 cm). All of these pulmonary nodules appear larger than the prior examination. There is also a cavitary area in the posterior aspect of the right upper lobe abutting the major fissure which appears to have some internal septations and contains some dependent fluid. This cavitary area currently measures 2.4 x 4.2 cm, slightly smaller than the prior study, likely secondary to interval drainage of some of the liquified contents. Extensive pleural thickening in the inferior aspect of the left hemithorax, best demonstrated on 50 of series 2 where there is a 9.4 x 2.6 cm pleural-based mass which appears to show  signs of direct chest wall invasion, involving the posterior aspect of the low left eleventh rib (image 53 of series 2). Upper Abdomen: High attenuation material in the lumen of the gallbladder likely represents biliary sludge. There also appears to be a small calcified gallstone in the neck of the gallbladder. Musculoskeletal/Soft Tissues: Multiple lytic osseous lesions are noted throughout the visualized axial and appendicular skeleton, compatible with widespread metastatic disease to the bones. This is most evident in several of the ribs, most notable for a large lytic lesion in the anterior aspect of the right third rib, which appears likely related to direct chest wall invasion from the underlying right upper lobe mass. IMPRESSION: 1. Interval progression of disease as evidenced by significant enlargement of numerous pulmonary nodules and masses, and interval increase in size and number of numerous pleural based nodules and masses throughout the lungs bilaterally, some of which demonstrate direct chest wall invasion, including direct bony invasion, as detailed above. In addition, there are multiple other lytic osseous metastases in the visualized skeleton. 2. Biliary sludge and cholelithiasis. 3. Support apparatus, as above. 4. Additional incidental findings, as above. Electronically Signed   By: Vinnie Langton M.D.   On: 01/23/2015 13:08   Dg Chest  Port 1 View  01/24/2015  CLINICAL DATA:  Respiratory failure.  Endotracheal tube position EXAM: PORTABLE CHEST 1 VIEW COMPARISON:  Yesterday FINDINGS: Endotracheal tube tip between the clavicular heads and carina. The orogastric tube tip is at least in the stomach. Left-sided central lines in stable position, tips at the upper right atrium and lower SVC. Pulmonary metastatic disease and right larger than left pleural effusion, malignant by CT. No change to suggest new pneumonia, edema, or pneumothorax. IMPRESSION: 1. Stable positioning of tubes and lines. 2.  Widespread intrathoracic metastatic disease. Electronically Signed   By: Monte Fantasia M.D.   On: 01/24/2015 05:32   Dg Chest Port 1 View  01/23/2015  CLINICAL DATA:  Respiratory failure EXAM: PORTABLE CHEST 1 VIEW COMPARISON:  Radiograph 01/21/2015, 07/03/2014 CT FINDINGS: Endotracheal tube and LEFT central venous line are unchanged. Port in the LEFT chest wall. Loculated effusion at the RIGHT lateral lung base is unchanged. Bibasilar atelectasis is unchanged. Nodular densities at the lung bases again demonstrated. IMPRESSION: 1. Stable support apparatus. 2. Bilateral lower lobe pulmonary nodules may be increased from CT 07/03/2014 3. Loculated effusion on the RIGHT. Electronically Signed   By: Suzy Bouchard M.D.   On: 01/23/2015 07:09     ASSESSMENT / PLAN: PULMONARY ETT 11/1>> A: Acute respiratory failure 2nd to PNA (Aspiration vs HCAP) and pulmonary edema. Hx of PE from January 2016. Rt loculated pleural effusion >> likely malignant, present since 10/2014, increased burden of cancer in chest on CT Hx of asthma with wheezing. Records from [place] reviewed 10/2014-bronchoscopy negative for infectious etiology P:   Pressure support wean as tolerated >> will accept low Tv Continue pulmicort, atrovent, and add xopenex Diuresis as tolerated Tx dose lovenox  CARDIOVASCULAR Lt IJ CVL 11/1 >> Port >> A:  A fib/flutter with RVR  Septic shock >> resolved, then back on neo 11/9 P: amiodarone resumed 11/8 Lopressor 2.5- 5 q3h prn for rate control Taper neo gtt to off   RENAL A:  Hypokalemia. P:   Replace lytes as needed  GASTROINTESTINAL A:   Protein calorie malnutrition. Nausea P:   Zofran prn  Ct tube feeds at lower dose Reglan 10 every 86 doses Protonix for SUP  HEMATOLOGIC/ONCOLOGY A:   Stage IV breast cancer. P:  F/u at [facility] , discussed with Dr. Rip Harbour at [place]:   Septic shock 2nd to PNA, UTI. P:   Day 10/10 of   levaquin  BCx2 11/1 >>ng Sputum  11/1 >>oral flora Urine 11/2 >> E coli  ENDOCRINE A:   Relative adrenal insufficiency, on chronic steroids as outpt. P:   Stress dose solucortef  NEUROLOGIC A:   Cancer pain. P:   RASS goal 0  fentanyl gtt  GOALS of CARE No CPR, No defibrillation.    Updated husband at bedside.  Explained that she is not making progress with vent weaning.  Reviewed option of tracheostomy and long term vent support >>he will consider. I don't think tracheostomy will be in her best interest.  Discussed regarding [facility] transfer Will involve palliative care  Summary - ct weaning efforts Best outcome would be one way extubation here -Limited  progress with wean through this week. She was dyspneic at rest due to cancer prior to this ? Aspiration episode    The patient is critically ill with multiple organ systems failure and requires high complexity decision making for assessment and support, frequent evaluation and titration of therapies, application of advanced monitoring technologies and extensive interpretation  of multiple databases. Critical Care Time devoted to patient care services described in this note independent of APP time is 32 minutes.   Rigoberto Noel MD  01/24/2015, 8:35 AM

## 2015-01-24 NOTE — Progress Notes (Signed)
RT switched patient back to full support after increasing PS. Patient was still uncomfortable, tiring out, and labored. RN aware of changes.

## 2015-01-24 NOTE — Progress Notes (Signed)
[name] for Enoxaparin Indication: History of PE (on therapeutic Lovenox PTA); atrial flutter  Allergies  Allergen Reactions  . Albuterol Shortness Of Breath    Tachycardia, feels like she is going to die  . Penicillins Other (See Comments)    Childhood reaction Has patient had a PCN reaction causing immediate rash, facial/tongue/throat swelling, SOB or lightheadedness with hypotension: unknown Has patient had a PCN reaction causing severe rash involving mucus membranes or skin necrosis: unknown Has patient had a PCN reaction that required hospitalization unknown Has patient had a PCN reaction occurring within the last 10 years: unknown If all of the above answers are "NO", then may proceed with Cephalosporin use.   . Albuterol Sulfate Other (See Comments)    TACHYCARDIA  . Codeine Nausea Only and Other (See Comments)    woosy  . Ibuprofen Other (See Comments)    Induces patient asthma  . Meperidine Nausea Only    Other reaction(s): Other (See Comments) "wiped out" and nausea  . Other     Unknown narcotic  . Phenobarbital Other (See Comments)    Erratic behavior, hyper, jittery  . Adhesive [Tape] Rash   Patient Measurements: Height: '5\' 2"'$  (157.5 cm) Weight: 173 lb 11.6 oz (78.8 kg) IBW/kg (Calculated) : 50.1 HEPARIN DW (KG): 65.9  Vital Signs: Temp: 99 F (37.2 C) (11/10 0700) BP: 157/86 mmHg (11/10 0750) Pulse Rate: 88 (11/10 0750)  Labs:  Recent Labs  01/21/15 1814 01/23/15 0608 01/24/15 0530  HGB  --  8.9* 9.2*  HCT  --  29.5* 30.6*  PLT  --  297 325  CREATININE 0.43* 0.47 0.43*   Estimated Creatinine Clearance: 60.9 mL/min (by C-G formula based on Cr of 0.43).  Medical History: Past Medical History  Diagnosis Date  . Asthma   . Breast pain in female     throbbing  . Abdominal pain   . Breast lump in female   . Cancer ([place])     rt  . PE (pulmonary embolism) 03/26/2014  . Neuropathy ([place])   . GERD  (gastroesophageal reflux disease)   . History of hiatal hernia    Assessment: 5 y/oF with PMH of metastatic breast cancer undergoing chemotherapy at [name], PE on chronic Lovenox for anticoagulation, and GERD who presented to [name] for evaluation of possible aspiration and SOB. Patient decompensated in ED and was intubated for respiratory failure. Patient also found to be in a-flutter.  Lovenox held on admission and pharmacy was consulted to assist with dosing of heparin infusion. Noted PTA dose of Lovenox was 50 mg SQ daily (dose was adjusted at [name] per LMWH levels) with last dose reported as 10/31 at 2100.  Today, 01/24/2015:  Remains on Lovenox 50 mg SQ q24h  Hgb stable, Pltc WNL  No bleeding reported in recent chart note.  Goal of Therapy:  Monitor platelets by anticoagulation protocol: Yes   Plan:   Continue Lovenox '50mg'$  SQ daily (this dose is based off of monitoring of LMWH anti-Xa levels at [name])  Monitor  for s/s of bleeding  Clayburn Pert, PharmD, BCPS Pager: [phone] 01/24/2015  9:17 AM

## 2015-01-24 NOTE — Consult Note (Signed)
Consultation Note Date: 01/24/2015   Patient Name: [name]  DOB: [birth year]  MRN: [id number]  Age / Sex: 74 y.o., female   PCP: Cari Caraway, MD Referring Physician: Marshell Garfinkel, MD  Reason for Consultation: Establishing goals of care and Psychosocial/spiritual support  Palliative Care Assessment and Plan Summary of Established Goals of Care and Medical Treatment Preferences   Clinical Assessment/Narrative: Mrs. Belinsky is resting quietly in bed as I enter.  Her husband and son are at her bedside.  She is able to effectively communicate by writing on a note pad.  I ask about pain and symptom management and they deny uncontrolled pain or anxiety.  Mr. Vavra talks about how the staff has eased this difficult situation.  Nursing staff shares the plan for a one way extubation on Monday.  We talk about the plan and Mrs. Heal shares that she is accepting. She and her husband are planning to have family visits this weekend.  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  She writes that she is in "God's hands", and that her only worry is for her husband.  They are holding hands the entire time we talk.  Mr. Brassfield tells her that he will be ok and that he is in God's hands too.  I share that PMT is willing to be there with them during the extubation. Son, Jeneen Rinks asks about this process and if children are allowed.  I share with him the concept of privacy with extubation (husband will be at bedside), but that family will be encouraged to visit afterwards.   There are no further questions or concerns noted and I encourage family to call if needed.   Contacts/Participants in Discussion: Primary Decision Maker: Mrs. Bartelt is able to make her own choices. She is able to write her thoughts and wishes.  HCPOA:   Husband Ed Yvone Neu) [place] November.   Son Jeneen Rinks at bedside today.   Code Status/Advance Care Planning:  Partial -  No CPR No defibrillation.   One way extubation Monday 11/14, family is preparing, meeting together this weekend.   Symptom Management:   Fentanyl infusion, plus 25 mcg bolus Q 1 hour PRN  Palliative Prophylaxis: Dulcolax 10 mg PR QD PRN  Psycho-social/Spiritual:   Support System: Lives with husband Yvone Neu, 6 children, many grandchildren.   Desire for further Chaplaincy support:Not discussed at this time.   Prognosis: Unable to determine, One way extubation scheduled for Monday 11/14  Discharge Planning:  Anticipated Hospital Death       Chief Complaint:  Aspiration, vent dependent respiratory failure, sepsis, atrial flutter. History of Present Illness:  Mrs. 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 She is being [facility] at [facility] and has failed multiple rounds of chemotherapy. She is currently getting palliative chemotherapy with Gemzar started on 10/04/14. She also has history of PE in January 2016. She is on Lovenox anticoagulation for that. She is being monitored at the anticoagulation medication clinic at [facility] by [place] levels and had been therapeutic as per the last report on 12/26/14. She was admitted in April 2016 with an influenza, HCAP, postobstructive pneumonia. She was bronched at [facility] for that but no bacterial organism identified. She is on Bactrim as an outpatient for unclear reasons. As per the husband she had been declining rapidly over the past few weeks with with weakness, loss of appetite malaise and very  poor PO intake.    Primary Diagnoses  Present on Admission:  . Respiratory failure ([place]) . Shock ([place]) . Atrial flutter with rapid ventricular response ([facility])  Palliative Review of Systems: Mrs. Garrette denies uncontrolled pain, anxiety.  I have reviewed the medical record, interviewed the patient and family, and examined the patient. The following aspects are  pertinent.  Past Medical History  Diagnosis Date  . Asthma   . Breast pain in female     throbbing  . Abdominal pain   . Breast lump in female   . Cancer ([place])     rt  . PE (pulmonary embolism) 03/26/2014  . Neuropathy ([place])   . GERD (gastroesophageal reflux disease)   . History of hiatal hernia    Social History   Social History  . Marital Status: Married    Spouse Name: N/A  . Number of Children: N/A  . Years of Education: N/A   Social History Main Topics  . Smoking status: Never Smoker   . Smokeless tobacco: Never Used  . Alcohol Use: No  . Drug Use: No  . Sexual Activity: Yes   Other Topics Concern  . None   Social History Narrative   History reviewed. No pertinent family history. Scheduled Meds: . antiseptic oral rinse  7 mL Mouth Rinse QID  . budesonide (PULMICORT) nebulizer solution  0.5 mg Nebulization BID  . chlorhexidine gluconate  15 mL Mouth Rinse BID  . enoxaparin (LOVENOX) injection  50 mg Subcutaneous Daily  . furosemide  40 mg Intravenous Daily  . hydrocortisone sod succinate (SOLU-CORTEF) inj  100 mg Intravenous Q12H  . insulin aspart  2-6 Units Subcutaneous 6 times per day  . ipratropium  0.5 mg Nebulization Q6H  . levalbuterol  0.63 mg Nebulization Q6H  . levofloxacin  750 mg Per Tube Daily  . metoCLOPramide (REGLAN) injection  10 mg Intravenous Q8H  . nystatin  5 mL Oral QID  . pantoprazole sodium  40 mg Per Tube Q1200  . potassium chloride  40 mEq Oral Daily  . potassium chloride  40 mEq Per Tube Once   Continuous Infusions: . sodium chloride 10 mL/hr at 01/24/15 0800  . amiodarone 30 mg/hr (01/24/15 0209)  . feeding supplement (VITAL HIGH PROTEIN) 1,000 mL (01/23/15 1800)  . fentaNYL infusion INTRAVENOUS 50 mcg/hr (01/24/15 0904)  . phenylephrine (NEO-SYNEPHRINE) Adult infusion 30 mcg/min (01/24/15 1300)   PRN Meds:.acetaminophen, bisacodyl, fentaNYL, magic mouthwash, metoprolol, ondansetron (ZOFRAN) IV, promethazine,  sennosides Medications Prior to Admission:  Prior to Admission medications   Medication Sig Start Date End Date Taking? Authorizing Provider  acetaminophen (TYLENOL) 325 MG tablet Take 2 tablets (650 mg total) by mouth every 6 (six) hours as needed for mild pain (or Fever >/= 101). 07/05/14  Yes Ripudeep K Rai, MD  beclomethasone (QVAR) 40 MCG/ACT inhaler Inhale 2 puffs into the lungs 2 (two) times daily as needed (SOB).    Yes Historical Provider, MD  Calcium Carbonate-Vitamin D (CALCIUM + D PO) Take 2 tablets by mouth daily.   Yes Historical Provider, MD  calcium elemental as carbonate (TUMS ULTRA 1000) 400 MG chewable tablet Chew 1,000-2,000 mg by mouth 3 (three) times daily as needed for heartburn.   Yes Historical Provider, MD  enoxaparin (LOVENOX) 60 MG/0.6ML injection Inject 50 mg into the skin daily.  04/24/14  Yes Historical Provider, MD  furosemide (LASIX) 20 MG tablet Take 20 mg by mouth daily. 12/05/14 12/05/15 Yes Historical Provider, MD  Glucosamine HCl-MSM 1500-500 MG/30ML  LIQD Take 30 mLs by mouth daily.   Yes Historical Provider, MD  ipratropium (ATROVENT) 0.02 % nebulizer solution Take 2.5 mLs (0.5 mg total) by nebulization every 6 (six) hours. 07/05/14  Yes Ripudeep Krystal Eaton, MD  lidocaine-prilocaine (EMLA) cream Apply 1 application topically once.    Yes Historical Provider, MD  Multiple Vitamin (MULTIVITAMIN WITH MINERALS) TABS tablet Take 1 tablet by mouth daily.   Yes Historical Provider, MD  ondansetron (ZOFRAN) 4 MG tablet Take 4 mg by mouth every 8 (eight) hours as needed for nausea.  04/05/14  Yes Historical Provider, MD  predniSONE (DELTASONE) 10 MG tablet Take 30 mg by mouth daily with breakfast.  04/28/14  Yes Historical Provider, MD  [place] Patient goes to [facility] to get chemo.  Patient goes 2 weeks on and 1 weeks off   Yes Historical Provider, MD  ranitidine (ZANTAC) 150 MG tablet Take 150 mg by mouth 2 (two) times daily.   Yes Historical Provider,  MD  sulfamethoxazole-trimethoprim (BACTRIM,SEPTRA) 400-80 MG tablet Take 1 tablet by mouth daily.   Yes Historical Provider, MD  benzonatate (TESSALON) 100 MG capsule Take 1 capsule (100 mg total) by mouth 3 (three) times daily. Patient not taking: Reported on 01/15/2015 07/05/14   Ripudeep Krystal Eaton, MD  clotrimazole (LOTRIMIN) 1 % cream Apply topically 2 (two) times daily. Patient not taking: Reported on 01/15/2015 05/21/14   Belkys A Regalado, MD  guaiFENesin-dextromethorphan (ROBITUSSIN DM) 100-10 MG/5ML syrup Take 5 mLs by mouth every 4 (four) hours as needed for cough. Patient not taking: Reported on 01/15/2015 07/05/14   Ripudeep Krystal Eaton, MD  levofloxacin (LEVAQUIN) 750 MG/150ML SOLN Inject 150 mLs (750 mg total) into the vein daily. Patient not taking: Reported on 01/15/2015 07/05/14   Ripudeep Krystal Eaton, MD  menthol-cetylpyridinium (CEPACOL) 3 MG lozenge Take 1 lozenge (3 mg total) by mouth as needed for sore throat. Patient not taking: Reported on 01/15/2015 07/05/14   Ripudeep K Rai, MD  urea (CARMOL) 10 % cream Apply topically as needed. Apply on palm Patient not taking: Reported on 01/15/2015 05/21/14   Belkys A Regalado, MD  vancomycin (VANCOCIN) 1 GM/200ML SOLN Inject 200 mLs (1,000 mg total) into the vein every 12 (twelve) hours. Patient not taking: Reported on 01/15/2015 07/05/14   Ripudeep Krystal Eaton, MD   Allergies  Allergen Reactions  . Albuterol Shortness Of Breath    Tachycardia, feels like she is going to die  . Penicillins Other (See Comments)    Childhood reaction Has patient had a PCN reaction causing immediate rash, facial/tongue/throat swelling, SOB or lightheadedness with hypotension: unknown Has patient had a PCN reaction causing severe rash involving mucus membranes or skin necrosis: unknown Has patient had a PCN reaction that required hospitalization unknown Has patient had a PCN reaction occurring within the last 10 years: unknown If all of the above answers are "NO", then may proceed  with Cephalosporin use.   . Albuterol Sulfate Other (See Comments)    TACHYCARDIA  . Codeine Nausea Only and Other (See Comments)    woosy  . Ibuprofen Other (See Comments)    Induces patient asthma  . Meperidine Nausea Only    Other reaction(s): Other (See Comments) "wiped out" and nausea  . Other     Unknown narcotic  . Phenobarbital Other (See Comments)    Erratic behavior, hyper, jittery  . Adhesive [Tape] Rash   CBC:    Component Value Date/Time   WBC 15.1* 01/24/2015 0530  WBC 6.4 10/15/2010 1237   HGB 9.2* 01/24/2015 0530   HGB 12.9 10/15/2010 1237   HCT 30.6* 01/24/2015 0530   HCT 38.7 10/15/2010 1237   PLT 325 01/24/2015 0530   PLT 335 10/15/2010 1237   MCV 99.7 01/24/2015 0530   MCV 88.4 10/15/2010 1237   NEUTROABS 8.0* 01/15/2015 2115   NEUTROABS 3.9 10/15/2010 1237   LYMPHSABS 0.5* 01/15/2015 2115   LYMPHSABS 2.1 10/15/2010 1237   MONOABS 0.5 01/15/2015 2115   MONOABS 0.4 10/15/2010 1237   EOSABS 0.0 01/15/2015 2115   EOSABS 0.1 10/15/2010 1237   BASOSABS 0.0 01/15/2015 2115   BASOSABS 0.0 10/15/2010 1237   Comprehensive Metabolic Panel:    Component Value Date/Time   NA 141 01/24/2015 0530   K 3.0* 01/24/2015 0530   CL 97* 01/24/2015 0530   CO2 38* 01/24/2015 0530   BUN 22* 01/24/2015 0530   CREATININE 0.43* 01/24/2015 0530   GLUCOSE 157* 01/24/2015 0530   CALCIUM 8.5* 01/24/2015 0530   AST 17 01/19/2015 0450   ALT 11* 01/19/2015 0450   ALKPHOS 92 01/19/2015 0450   BILITOT 0.7 01/19/2015 0450   PROT 5.2* 01/19/2015 0450   ALBUMIN 2.2* 01/19/2015 0450    Physical Exam: Vital Signs: BP 52/27 mmHg  Pulse 85  Temp(Src) 99.5 F (37.5 C) (Core (Comment))  Resp 15  Ht $R'5\' 2"'HN$  (1.575 m)  Wt 78.8 kg (173 lb 11.6 oz)  BMI 31.77 kg/m2  SpO2 100% SpO2: SpO2: 100 % O2 Device: O2 Device: Ventilator O2 Flow Rate: O2 Flow Rate (L/min): 40 L/min Intake/output summary:  Intake/Output Summary (Last 24 hours) at 01/24/15 1315 Last data filed at  01/24/15 1300  Gross per 24 hour  Intake 2241.78 ml  Output    735 ml  Net 1506.78 ml   LBM: Last BM Date: 01/24/15 Baseline Weight: Weight: 74.39 kg (164 lb) Most recent weight: Weight: 78.8 kg (173 lb 11.6 oz)  Exam Findings:  Constitutional: ill appearing, ventilated.  Resp: Vented, wean 30 minutes today, 90 minutes yesterday.  Cardio: regular, rate 80's.          Palliative Performance Scale: 10% tube feeding.               Additional Data Reviewed: Recent Labs     01/23/15  0608  01/24/15  0530  WBC  10.3  15.1*  HGB  8.9*  9.2*  PLT  297  325  NA  145  141  BUN  25*  22*  CREATININE  0.47  0.43*     Time In: 1240 Time Out: 1340 Time Total:  60 minutes  Greater than 50%  of this time was spent counseling and coordinating care related to the above assessment and plan.  Signed by: Drue Novel, NP  Drue Novel, NP  01/24/2015, 1:15 PM  Please contact Palliative Medicine Team phone at [phone] for questions and concerns.

## 2015-01-24 NOTE — Care Management Note (Signed)
Case Management Note  Patient Details  Name: [name] MRN: [id number] Date of Birth: 09-Jun-1940  Subjective/Objective:                    Action/Plan:   Expected Discharge Date:   (unknown)               Expected Discharge Plan:  Skilled Nursing Facility  In-House Referral:  Clinical Social Work  Discharge planning Services  CM Consult  Post Acute Care Choice:    Choice offered to:     DME Arranged:    DME Agency:     HH Arranged:    [place] Agency:     Status of Service:  In process, will continue to follow  Medicare Important Message Given:  Yes-second notification given Date Medicare IM Given:    Medicare IM give by:    Date Additional Medicare IM Given:    Additional Medicare Important Message give by:     If discussed at [place] of Stay Meetings, dates discussed:  [id number]  Additional Comments:  Leeroy Cha, RN 01/24/2015, 10:14 AM

## 2015-01-24 NOTE — Progress Notes (Signed)
Pt requested HOB to be less than 30 after a bath and turning which made her nauseous.  HOB left at 20 degrees for approximately one hour.  PRN med given and HOB elevated over 30 degrees after nausea subsided.

## 2015-01-24 NOTE — Progress Notes (Signed)
RT placed patient on PSV/CPAP 15/+5. RT to wean to 12/+5 per Dr. Elsworth Soho. Dr. Elsworth Soho accepting Vt of >250 mL for patient. Patient is comfortable and no SOB noted. Vitals stable. RT will continue to monitor patient.

## 2015-01-25 ENCOUNTER — Inpatient Hospital Stay (HOSPITAL_COMMUNITY): Payer: Medicare Other

## 2015-01-25 LAB — BASIC METABOLIC PANEL WITH GFR
Anion gap: 5 (ref 5–15)
BUN: 19 mg/dL (ref 6–20)
CO2: 37 mmol/L — ABNORMAL HIGH (ref 22–32)
Calcium: 8.4 mg/dL — ABNORMAL LOW (ref 8.9–10.3)
Chloride: 95 mmol/L — ABNORMAL LOW (ref 101–111)
Creatinine, Ser: 0.39 mg/dL — ABNORMAL LOW (ref 0.44–1.00)
GFR calc Af Amer: >60 mL/min (ref >60)
GFR calc non Af Amer: >60 mL/min (ref >60)
Glucose, Bld: 127 mg/dL — ABNORMAL HIGH (ref 65–99)
Potassium: 3 mmol/L — ABNORMAL LOW (ref 3.5–5.1)
Sodium: 137 mmol/L (ref 135–145)

## 2015-01-25 LAB — GLUCOSE, CAPILLARY
Glucose-Capillary: 137 mg/dL — ABNORMAL HIGH (ref 65–99)
Glucose-Capillary: 164 mg/dL — ABNORMAL HIGH (ref 65–99)
Glucose-Capillary: 99 mg/dL (ref 65–99)
Glucose-Capillary: 113 mg/dL — ABNORMAL HIGH (ref 65–99)

## 2015-01-25 LAB — CBC
HCT: 31.3 % — ABNORMAL LOW (ref 36.0–46.0)
Hemoglobin: 9.6 g/dL — ABNORMAL LOW (ref 12.0–15.0)
MCH: 30.7 pg (ref 26.0–34.0)
MCHC: 30.7 g/dL (ref 30.0–36.0)
MCV: 100 fL (ref 78.0–100.0)
Platelets: 359 K/uL (ref 150–400)
RBC: 3.13 MIL/uL — ABNORMAL LOW (ref 3.87–5.11)
RDW: 21 % — ABNORMAL HIGH (ref 11.5–15.5)
WBC: 17 K/uL — ABNORMAL HIGH (ref 4.0–10.5)

## 2015-01-25 MED ORDER — HYDROCORTISONE NA SUCCINATE PF 100 MG IJ SOLR
50.0000 mg | Freq: Two times a day (BID) | INTRAMUSCULAR | Status: DC
  Administered 2015-01-25 – 2015-01-30 (×10): 50 mg via INTRAVENOUS
  Filled 2015-01-25 (×10): qty 2

## 2015-01-25 MED ORDER — POTASSIUM CHLORIDE 20 MEQ/15ML (10%) PO SOLN
40.0000 meq | Freq: Once | ORAL | Status: AC
  Administered 2015-01-25: 40 meq
  Filled 2015-01-25: qty 30

## 2015-01-25 NOTE — Progress Notes (Signed)
PULMONARY / CRITICAL CARE MEDICINE   Name: [name] [name] MRN: [id number] DOB: 10-25-1940    ADMISSION DATE:  01/15/2015 CONSULTATION DATE:  01/15/15  REFERRING MD :  ED  CHIEF COMPLAINT:  Aspiration, vent dependent respiratory failure, sepsis, atrial flutter.  INITIAL PRESENTATION: 74 yo female presented with respiratory failure, a flutter with RVR.  Has hx of Stage IV breast cancer.   STUDIES:  11/02 Echo >> EF 55 to 60% 11/9 CT chest >> Interval progression of disease - significant enlargement of numerous pulmonary nodules and masses, and interval increase in size and number of numerous pleural based nodules and masses throughout the lungs bilaterally, some of which demonstrate direct chest wall invasion, including direct bony invasion, multiple other lytic osseous metastases in the visualized skeleton.  SIGNIFICANT EVENTS: 11/1 Admit, amio/heparin gtt, phenylephrine, abx 11/2 off pressors, sinus rhythm 11/3 start lasix 11/4 wheezing >> added ICS 11/5 d/c amiodarone, cardiology sign off 11/7 wean on 15/5 11/8 Af-RVR - amio gtt -nSR  SUBJECTIVE:  Afebrile C/o epigastric pain  & int nausea In nsR on amio gtt  remains on low dose neo gtt  VITAL SIGNS: Temp:  [97.5 F (36.4 C)-99.5 F (37.5 C)] 98.4 F (36.9 C) (11/11 0800) Pulse Rate:  [65-88] 75 (11/11 0310) Resp:  [0-23] 16 (11/11 0800) BP: (52-149)/(27-116) 139/76 mmHg (11/11 0800) SpO2:  [98 %-100 %] 100 % (11/11 0800) FiO2 (%):  [30 %] 30 % (11/11 0310) Weight:  [79 kg (174 lb 2.6 oz)] 79 kg (174 lb 2.6 oz) (11/11 0500) HEMODYNAMICS:   VENTILATOR SETTINGS: Vent Mode:  [-] PRVC FiO2 (%):  [30 %] 30 % Set Rate:  [15 bmp] 15 bmp Vt Set:  [400 mL] 400 mL PEEP:  [5 cmH20] 5 cmH20 Plateau Pressure:  [27 cmH20-31 cmH20] 30 cmH20 INTAKE / OUTPUT:  Intake/Output Summary (Last 24 hours) at 01/25/15 0917 Last data filed at 01/25/15 0800  Gross per 24 hour  Intake 1795.49 ml  Output   1630 ml  Net 165.49 ml     PHYSICAL EXAMINATION: General: pleasant, chr ill , interactive -able to write HEENT: ETT in place PULM: b/l decreased  CV: regular GI: non tender MSK: 1+ edema Derm: bruises extensor surfaces, ecchymosis Neuro: RASS 0, non focal, deconditioned  LABS:  CBC  Recent Labs Lab 01/23/15 0608 01/24/15 0530 01/25/15 0535  WBC 10.3 15.1* 17.0*  HGB 8.9* 9.2* 9.6*  HCT 29.5* 30.6* 31.3*  PLT 297 325 359   Coag's No results for input(s): APTT, INR in the last 168 hours. BMET  Recent Labs Lab 01/23/15 0608 01/24/15 0530 01/25/15 0535  NA 145 141 137  K 3.4* 3.0* 3.0*  CL 101 97* 95*  CO2 37* 38* 37*  BUN 25* 22* 19  CREATININE 0.47 0.43* 0.39*  GLUCOSE 122* 157* 127*   Electrolytes  Recent Labs Lab 01/21/15 0445  01/23/15 0608 01/24/15 0530 01/25/15 0535  CALCIUM 8.7*  < > 8.7* 8.5* 8.4*  MG 1.9  --  1.8 1.6*  --   PHOS 2.8  --  4.0 3.0  --   < > = values in this interval not displayed.   Sepsis Markers No results for input(s): LATICACIDVEN, PROCALCITON, O2SATVEN in the last 168 hours. ABG No results for input(s): PHART, PCO2ART, PO2ART in the last 168 hours. Liver Enzymes  Recent Labs Lab 01/19/15 0450  AST 17  ALT 11*  ALKPHOS 92  BILITOT 0.7  ALBUMIN 2.2*   Cardiac Enzymes No results for input(s):  TROPONINI, PROBNP in the last 168 hours. Glucose  Recent Labs Lab 01/24/15 0749 01/24/15 1124 01/24/15 1607 01/24/15 2016 01/24/15 2341 01/25/15 0755  GLUCAP 123* 156* 102* 143* 177* 137*    Imaging Ct Chest Wo Contrast  01/23/2015  CLINICAL DATA:  74 year old female with history of metastatic breast cancer. Respiratory failure. Possible aspiration. EXAM: CT CHEST WITHOUT CONTRAST TECHNIQUE: Multidetector CT imaging of the chest was performed following the standard protocol without IV contrast. COMPARISON:  Chest CT 07/03/2014. FINDINGS: Mediastinum/Lymph Nodes: Tracheostomy tube in position with tip 3.9 cm above the carina. Nasogastric tube  extends into the stomach (tip is below the lower margin of the images). Left-sided single-lumen internal jugular Port-A-Cath with tip terminating in the right atrium. Left internal jugular central venous catheter with tip terminating at the superior cavoatrial junction. Heart size is normal. There is no significant pericardial fluid, thickening or pericardial calcification. There is atherosclerosis of the thoracic aorta, the great vessels of the mediastinum and the coronary arteries, including calcified atherosclerotic plaque in the right coronary artery. Mildly enlarged right hilar lymph node measuring 1 cm in short axis, similar to the prior examination. No other definite enlarged mediastinal or left hilar lymph nodes. Esophagus is unremarkable in appearance. No axillary lymphadenopathy. Lungs/Pleura: Interval development of a moderate partially loculated right pleural effusion which appears to be malignant, as evidenced by extensive areas of right-sided pleural thickening and nodularity, most notable for a pleural-based mass in the inferior aspect of the right hemithorax measuring 4.7 x 2.5 cm (previously 1.9 x 1.2 cm on prior study 06/23/2014). Multiple left-sided pulmonary and pleural nodules also noted, largest of which is in the medial aspect of the left lower lobe (image 38 of series 2) measuring 2.9 x 2.8 cm (previously 1.7 cm). All of these pulmonary nodules appear larger than the prior examination. There is also a cavitary area in the posterior aspect of the right upper lobe abutting the major fissure which appears to have some internal septations and contains some dependent fluid. This cavitary area currently measures 2.4 x 4.2 cm, slightly smaller than the prior study, likely secondary to interval drainage of some of the liquified contents. Extensive pleural thickening in the inferior aspect of the left hemithorax, best demonstrated on 50 of series 2 where there is a 9.4 x 2.6 cm pleural-based mass  which appears to show signs of direct chest wall invasion, involving the posterior aspect of the low left eleventh rib (image 53 of series 2). Upper Abdomen: High attenuation material in the lumen of the gallbladder likely represents biliary sludge. There also appears to be a small calcified gallstone in the neck of the gallbladder. Musculoskeletal/Soft Tissues: Multiple lytic osseous lesions are noted throughout the visualized axial and appendicular skeleton, compatible with widespread metastatic disease to the bones. This is most evident in several of the ribs, most notable for a large lytic lesion in the anterior aspect of the right third rib, which appears likely related to direct chest wall invasion from the underlying right upper lobe mass. IMPRESSION: 1. Interval progression of disease as evidenced by significant enlargement of numerous pulmonary nodules and masses, and interval increase in size and number of numerous pleural based nodules and masses throughout the lungs bilaterally, some of which demonstrate direct chest wall invasion, including direct bony invasion, as detailed above. In addition, there are multiple other lytic osseous metastases in the visualized skeleton. 2. Biliary sludge and cholelithiasis. 3. Support apparatus, as above. 4. Additional incidental findings, as above. Electronically  Signed   By: Vinnie Langton M.D.   On: 01/23/2015 13:08   Dg Chest Port 1 View  01/25/2015  CLINICAL DATA:  Respiratory failure.  Hypoxemia EXAM: PORTABLE CHEST 1 VIEW COMPARISON:  01/24/2015 FINDINGS: Endotracheal tube with tip just below the clavicular heads. An orogastric tube reaches the stomach. Left porta catheter and external central line are in stable position, lowest tip at the right atrial level. Stable mild cardiomegaly.  Stable upper mediastinal contours. Stable bilateral pleural effusion, right greater than left, with loculation. Multiple pulmonary nodules. No pneumothorax. No new  opacification. IMPRESSION: 1. Stable positioning of tubes and lines. 2. Widespread pleural and pulmonary metastases without new superimposed finding. Electronically Signed   By: Monte Fantasia M.D.   On: 01/25/2015 06:36   Dg Chest Port 1 View  01/24/2015  CLINICAL DATA:  Respiratory failure.  Endotracheal tube position EXAM: PORTABLE CHEST 1 VIEW COMPARISON:  Yesterday FINDINGS: Endotracheal tube tip between the clavicular heads and carina. The orogastric tube tip is at least in the stomach. Left-sided central lines in stable position, tips at the upper right atrium and lower SVC. Pulmonary metastatic disease and right larger than left pleural effusion, malignant by CT. No change to suggest new pneumonia, edema, or pneumothorax. IMPRESSION: 1. Stable positioning of tubes and lines. 2. Widespread intrathoracic metastatic disease. Electronically Signed   By: Monte Fantasia M.D.   On: 01/24/2015 05:32     ASSESSMENT / PLAN: PULMONARY ETT 11/1>> A: Acute respiratory failure 2nd to PNA (Aspiration vs HCAP) and pulmonary edema. Hx of PE from January 2016. Rt loculated pleural effusion >> likely malignant, present since 10/2014, increased burden of cancer in chest on CT Hx of asthma with wheezing. Records from [place] reviewed 10/2014-bronchoscopy negative for infectious etiology P:   Pressure support wean as tolerated >> will accept low Tv Continue pulmicort, atrovent, and add xopenex Diuresis as tolerated Tx dose lovenox  CARDIOVASCULAR Lt IJ CVL 11/1 >> Port >> A:  A fib/flutter with RVR  Septic shock >> resolved, then back on neo 11/9 P: Ct amiodarone resumed 11/8 Lopressor 2.5- 5 q3h prn for rate control Taper neo gtt to off   RENAL A:  Hypokalemia. P:   Replace lytes as needed Standing K replace while on lasix  GASTROINTESTINAL A:   Protein calorie malnutrition. Nausea P:   Zofran prn  Ct tube feeds at lower dose Reglan 10 every 86 doses Protonix for  SUP  HEMATOLOGIC/ONCOLOGY A:   Stage IV breast cancer. P:  F/u at [facility] , discussed with Dr. Rip Harbour at [place]:   Septic shock 2nd to PNA, UTI. P:   Day 10/10 of   levaquin  BCx2 11/1 >>ng Sputum 11/1 >>oral flora Urine 11/2 >> E coli  ENDOCRINE A:   Relative adrenal insufficiency, on chronic steroids as outpt. P:   Stress dose solucortef  NEUROLOGIC A:   Cancer pain. P:   RASS goal 0  fentanyl gtt  GOALS of CARE No CPR, No defibrillation.    Updated husband at bedside.  Not a candidate for tracheostomy and long term vent support >> appreciate  palliative care input  Summary - ct weaning efforts over weekend Planning for one way extubation on Monday -Limited  progress with wean through this week. Best outcome would be home hospice if she survives. She was dyspneic at rest due to cancer prior to this ? Aspiration episode    The patient is critically ill with multiple organ systems failure  and requires high complexity decision making for assessment and support, frequent evaluation and titration of therapies, application of advanced monitoring technologies and extensive interpretation of multiple databases. Critical Care Time devoted to patient care services described in this note independent of APP time is 32 minutes.   Rigoberto Noel MD [phone]  01/25/2015, 9:17 AM

## 2015-01-26 LAB — BASIC METABOLIC PANEL
Anion gap: 6 (ref 5–15)
BUN: 17 mg/dL (ref 6–20)
CHLORIDE: 91 mmol/L — AB (ref 101–111)
CO2: 36 mmol/L — AB (ref 22–32)
CREATININE: 0.4 mg/dL — AB (ref 0.44–1.00)
Calcium: 8.2 mg/dL — ABNORMAL LOW (ref 8.9–10.3)
GFR calc non Af Amer: >60 mL/min (ref >60)
Glucose, Bld: 114 mg/dL — ABNORMAL HIGH (ref 65–99)
POTASSIUM: 3.1 mmol/L — AB (ref 3.5–5.1)
SODIUM: 133 mmol/L — AB (ref 135–145)

## 2015-01-26 LAB — CBC
HEMATOCRIT: 32.3 % — AB (ref 36.0–46.0)
HEMOGLOBIN: 9.9 g/dL — AB (ref 12.0–15.0)
MCH: 30 pg (ref 26.0–34.0)
MCHC: 30.7 g/dL (ref 30.0–36.0)
MCV: 97.9 fL (ref 78.0–100.0)
Platelets: 347 10*3/uL (ref 150–400)
RBC: 3.3 MIL/uL — AB (ref 3.87–5.11)
RDW: 20.2 % — ABNORMAL HIGH (ref 11.5–15.5)
WBC: 20.3 10*3/uL — ABNORMAL HIGH (ref 4.0–10.5)

## 2015-01-26 LAB — GLUCOSE, CAPILLARY
GLUCOSE-CAPILLARY: 131 mg/dL — AB (ref 65–99)
GLUCOSE-CAPILLARY: 102 mg/dL — AB (ref 65–99)
GLUCOSE-CAPILLARY: 148 mg/dL — AB (ref 65–99)
GLUCOSE-CAPILLARY: 71 mg/dL (ref 65–99)
Glucose-Capillary: 125 mg/dL — ABNORMAL HIGH (ref 65–99)
Glucose-Capillary: 134 mg/dL — ABNORMAL HIGH (ref 65–99)
Glucose-Capillary: 166 mg/dL — ABNORMAL HIGH (ref 65–99)

## 2015-01-26 MED ORDER — ACETAMINOPHEN 500 MG PO TABS
1000.0000 mg | ORAL_TABLET | Freq: Four times a day (QID) | ORAL | Status: DC | PRN
  Administered 2015-01-26 – 2015-01-28 (×4): 1000 mg
  Filled 2015-01-26 (×3): qty 2

## 2015-01-26 MED ORDER — GLYCOPYRROLATE 0.2 MG/ML IJ SOLN
0.1000 mg | Freq: Four times a day (QID) | INTRAMUSCULAR | Status: DC | PRN
  Administered 2015-01-26 – 2015-01-29 (×5): 0.1 mg via INTRAVENOUS
  Filled 2015-01-26 (×9): qty 0.5

## 2015-01-26 MED ORDER — POTASSIUM CHLORIDE 20 MEQ/15ML (10%) PO SOLN
40.0000 meq | Freq: Once | ORAL | Status: AC
  Administered 2015-01-26: 40 meq
  Filled 2015-01-26: qty 30

## 2015-01-26 NOTE — Progress Notes (Signed)
PULMONARY / CRITICAL CARE MEDICINE   Name: BRYLAN SEUBERT MRN: [id number] DOB: 1940/04/18    ADMISSION DATE:  01/15/2015 CONSULTATION DATE:  01/15/15  REFERRING MD :  ED  CHIEF COMPLAINT:  Aspiration, vent dependent respiratory failure, sepsis, atrial flutter.  INITIAL PRESENTATION: 74 yo female presented with respiratory failure, a flutter with RVR.  Has hx of Stage IV breast cancer.   STUDIES:  11/02 Echo >> EF 55 to 60% 11/9 CT chest >> Interval progression of disease - significant enlargement of numerous pulmonary nodules and masses, and interval increase in size and number of numerous pleural based nodules and masses throughout the lungs bilaterally, some of which demonstrate direct chest wall invasion, including direct bony invasion, multiple other lytic osseous metastases in the visualized skeleton.  SIGNIFICANT EVENTS: 11/1 Admit, amio/heparin gtt, phenylephrine, abx 11/2 off pressors, sinus rhythm 11/3 start lasix 11/4 wheezing >> added ICS 11/5 d/c amiodarone, cardiology sign off 11/7 wean on 15/5 11/8 Af-RVR - amio gtt -nSR  SUBJECTIVE:  Had trouble with oral secretions last night >> caused gagging.  VITAL SIGNS: BP 129/87 mmHg  Pulse 90  Temp(Src) 98.4 F (36.9 C) (Core (Comment))  Resp 15  Ht '5\' 2"'$  (1.575 m)  Wt 174 lb 2.6 oz (79 kg)  BMI 31.85 kg/m2  SpO2 100%  VENTILATOR SETTINGS: Vent Mode:  [-] PRVC FiO2 (%):  [30 %] 30 % Set Rate:  [15 bmp] 15 bmp Vt Set:  [400 mL] 400 mL PEEP:  [5 cmH20] 5 cmH20 Pressure Support:  [12 cmH20] 12 cmH20 Plateau Pressure:  [28 cmH20-33 cmH20] 33 cmH20 INTAKE / OUTPUT: I/O last 3 completed shifts: In: 2983.5 [I.V.:2303.5; NG/GT:680] Out: 2250 [Urine:2250]  PHYSICAL EXAMINATION: General: pleasant HEENT: ETT in place PULM: faint b/l crackles  CV: regular GI: non tender MSK: 1+ edema Derm: bruises extensor surfaces, ecchymosis Neuro: RASS 0, non focal, deconditioned  LABS:  CBC  Recent Labs Lab  01/24/15 0530 01/25/15 0535 01/26/15 0445  WBC 15.1* 17.0* 20.3*  HGB 9.2* 9.6* 9.9*  HCT 30.6* 31.3* 32.3*  PLT 325 359 347   BMET  Recent Labs Lab 01/24/15 0530 01/25/15 0535 01/26/15 0445  NA 141 137 133*  K 3.0* 3.0* 3.1*  CL 97* 95* 91*  CO2 38* 37* 36*  BUN 22* 19 17  CREATININE 0.43* 0.39* 0.40*  GLUCOSE 157* 127* 114*   Electrolytes  Recent Labs Lab 01/21/15 0445  01/23/15 0608 01/24/15 0530 01/25/15 0535 01/26/15 0445  CALCIUM 8.7*  < > 8.7* 8.5* 8.4* 8.2*  MG 1.9  --  1.8 1.6*  --   --   PHOS 2.8  --  4.0 3.0  --   --   < > = values in this interval not displayed.   Glucose  Recent Labs Lab 01/25/15 1204 01/25/15 1547 01/25/15 1955 01/26/15 0030 01/26/15 0357 01/26/15 0818  GLUCAP 164* 99 113* 166* 102* 134*    Imaging Dg Chest Port 1 View  01/25/2015  CLINICAL DATA:  Respiratory failure.  Hypoxemia EXAM: PORTABLE CHEST 1 VIEW COMPARISON:  01/24/2015 FINDINGS: Endotracheal tube with tip just below the clavicular heads. An orogastric tube reaches the stomach. Left porta catheter and external central line are in stable position, lowest tip at the right atrial level. Stable mild cardiomegaly.  Stable upper mediastinal contours. Stable bilateral pleural effusion, right greater than left, with loculation. Multiple pulmonary nodules. No pneumothorax. No new opacification. IMPRESSION: 1. Stable positioning of tubes and lines. 2. Widespread pleural and pulmonary metastases  without new superimposed finding. Electronically Signed   By: Monte Fantasia M.D.   On: 01/25/2015 06:36     ASSESSMENT / PLAN: PULMONARY ETT 11/1>> A: Acute respiratory failure 2nd to PNA (Aspiration vs HCAP) and pulmonary edema. Hx of PE from January 2016. Rt loculated pleural effusion >> likely malignant, present since 10/2014, increased burden of cancer in chest on CT. Hx of asthma with wheezing. Records from [place] reviewed 10/2014-bronchoscopy negative for infectious  etiology. P:   Pressure support wean as tolerated >> will accept low Tv Continue pulmicort, atrovent, and add xopenex Diuresis as tolerated Tx dose lovenox PRN robinul for secretions  CARDIOVASCULAR Lt IJ CVL 11/1 >> Port >> A:  A fib/flutter with RVR. Septic shock. P: Ct amiodarone resumed 11/8 Lopressor 2.5- 5 q3h prn for rate control Taper neo gtt to off   RENAL A:  Hypokalemia. P:   Replace lytes as needed Standing K replace while on lasix  GASTROINTESTINAL A:   Protein calorie malnutrition. Nausea. P:   Zofran prn  Ct tube feeds at lower dose Protonix for SUP  HEMATOLOGIC/ONCOLOGY A:   Stage IV breast cancer. P:  F/u at [facility] , discussed with Dr. Rip Harbour at [place]:   Septic shock 2nd to PNA, UTI >> completed abx 11/11. P:   Monitor off Abx  Urine 11/2 >> E coli  ENDOCRINE A:   Relative adrenal insufficiency, on chronic steroids as outpt. P:   Stress dose solucortef  NEUROLOGIC A:   Cancer pain. P:   RASS goal 0  fentanyl gtt  GOALS of CARE No CPR, No defibrillation.    SUMMARY: Updated husband at bedside.  Not a candidate for tracheostomy and long term vent support >> appreciate  palliative care input. Plan to arrange for extubation on Monday 11/14.  [facility] is to arrange for home hospice.  Chesley Mires, MD [facility] Pulmonary/Critical Care 01/26/2015, 8:55 AM Pager:  [phone] After 3pm call: [phone]

## 2015-01-27 DIAGNOSIS — Z515 Encounter for palliative care: Secondary | ICD-10-CM

## 2015-01-27 DIAGNOSIS — C7800 Secondary malignant neoplasm of unspecified lung: Secondary | ICD-10-CM

## 2015-01-27 DIAGNOSIS — C50919 Malignant neoplasm of unspecified site of unspecified female breast: Secondary | ICD-10-CM | POA: Insufficient documentation

## 2015-01-27 LAB — BASIC METABOLIC PANEL
Anion gap: 6 (ref 5–15)
BUN: 15 mg/dL (ref 6–20)
CALCIUM: 8.6 mg/dL — AB (ref 8.9–10.3)
CO2: 35 mmol/L — AB (ref 22–32)
CREATININE: 0.39 mg/dL — AB (ref 0.44–1.00)
Chloride: 94 mmol/L — ABNORMAL LOW (ref 101–111)
Glucose, Bld: 109 mg/dL — ABNORMAL HIGH (ref 65–99)
Potassium: 3.3 mmol/L — ABNORMAL LOW (ref 3.5–5.1)
SODIUM: 135 mmol/L (ref 135–145)

## 2015-01-27 LAB — GLUCOSE, CAPILLARY
GLUCOSE-CAPILLARY: 89 mg/dL (ref 65–99)
GLUCOSE-CAPILLARY: 112 mg/dL — AB (ref 65–99)
Glucose-Capillary: 136 mg/dL — ABNORMAL HIGH (ref 65–99)
Glucose-Capillary: 120 mg/dL — ABNORMAL HIGH (ref 65–99)
Glucose-Capillary: 165 mg/dL — ABNORMAL HIGH (ref 65–99)
Glucose-Capillary: 162 mg/dL — ABNORMAL HIGH (ref 65–99)

## 2015-01-27 LAB — MAGNESIUM: Magnesium: 1.5 mg/dL — ABNORMAL LOW (ref 1.7–2.4)

## 2015-01-27 MED ORDER — POTASSIUM CHLORIDE 20 MEQ/15ML (10%) PO SOLN
40.0000 meq | Freq: Once | ORAL | Status: AC
  Administered 2015-01-27: 40 meq via ORAL
  Filled 2015-01-27: qty 30

## 2015-01-27 MED ORDER — MAGNESIUM SULFATE 2 GM/50ML IV SOLN
2.0000 g | Freq: Once | INTRAVENOUS | Status: AC
  Administered 2015-01-27: 2 g via INTRAVENOUS
  Filled 2015-01-27: qty 50

## 2015-01-27 MED ORDER — INSULIN ASPART 100 UNIT/ML ~~LOC~~ SOLN
0.0000 [IU] | SUBCUTANEOUS | Status: DC
  Administered 2015-01-27 – 2015-01-28 (×3): 3 [IU] via SUBCUTANEOUS

## 2015-01-27 MED ORDER — SODIUM CHLORIDE 0.9 % IV SOLN
INTRAVENOUS | Status: DC
  Administered 2015-01-29 (×2): via INTRAVENOUS

## 2015-01-27 MED ORDER — VITAMINS A & D EX OINT
TOPICAL_OINTMENT | CUTANEOUS | Status: AC
  Administered 2015-01-27: 12:00:00
  Filled 2015-01-27: qty 5

## 2015-01-27 NOTE — Progress Notes (Signed)
Daily Progress Note   Patient Name: [name] [name]       Date: 01/27/2015 DOB: [birth year]  Age: 74 y.o. 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  She is currently awake and on ventilator support and cannot be weaned.  Plan for one way extubation tomorrow.    Interval Events: Met with patient and her husband.  She reports mild nausea, but no other complaints.  We talked about plan for extubation tomorrow including her concerns for symptom management.  We also discussed that this is NOT a form of physician assisted suicide as she specifically asked about this question.  Length of Stay: 12 days  Current Medications: Scheduled Meds:  . antiseptic oral rinse  7 mL Mouth Rinse QID  . budesonide (PULMICORT) nebulizer solution  0.5 mg Nebulization BID  . chlorhexidine gluconate  15 mL Mouth Rinse BID  . enoxaparin (LOVENOX) injection  50 mg Subcutaneous Daily  . furosemide  40 mg Intravenous Daily  . hydrocortisone sod succinate (SOLU-CORTEF) inj  50 mg Intravenous Q12H  . insulin aspart  0-15 Units Subcutaneous 6 times per day  . ipratropium  0.5 mg Nebulization Q6H  . levalbuterol  0.63 mg Nebulization Q6H  . magnesium sulfate 1 - 4 g bolus IVPB  2 g Intravenous Once  . nystatin  5 mL Oral QID  . pantoprazole sodium  40 mg Per Tube Q1200  . potassium chloride  40 mEq Oral Daily  . potassium chloride  40 mEq Oral Once    Continuous Infusions: . sodium chloride 10 mL/hr at 01/27/15 0000  . feeding supplement (VITAL HIGH PROTEIN) 1,000 mL (01/27/15 1000)  . fentaNYL infusion INTRAVENOUS 125 mcg/hr (01/27/15 1000)  . phenylephrine (NEO-SYNEPHRINE) Adult infusion 25 mcg/min (01/27/15  1000)    PRN Meds: acetaminophen, bisacodyl, fentaNYL, glycopyrrolate, magic mouthwash, metoprolol, ondansetron (ZOFRAN) IV, promethazine, sennosides  Palliative Performance Scale: 10%     Vital Signs: BP 116/71 mmHg  Pulse 67  Temp(Src) 98.8 F (37.1 C) (Core (Comment))  Resp 14  Ht $R'5\' 2"'bF$  (1.575 m)  Wt 76 kg (167 lb 8.8 oz)  BMI 30.64 kg/m2  SpO2 100% SpO2: SpO2: 100 % O2 Device: O2 Device: Ventilator O2 Flow Rate: O2 Flow Rate (L/min): 30 L/min  Intake/output summary:  Intake/Output Summary (Last 24 hours) at 01/27/15 1019 Last data filed at 01/27/15 1005  Gross per 24 hour  Intake 1790.37 ml  Output    900 ml  Net 890.37 ml   LBM:   Baseline Weight: Weight: 74.39 kg (164 lb) Most recent weight: Weight: 76 kg (167 lb 8.8 oz)  Physical Exam: General: Alert, awake, on vent.  Communicates through writing. Heart: Regular rate. Abdomen: Soft, nontender, nondistended, positive bowel sounds.  Skin: Warm and dry Neuro: Moves 4 extremities, nonfocal.              Additional Data Reviewed: Recent Labs     01/25/15  0535  01/26/15  0445  01/27/15  0625  WBC  17.0*  20.3*   --   HGB  9.6*  9.9*   --   PLT  359  347   --   NA  137  133*  135  BUN  $Re'19  17  15  'qQt$ CREATININE  0.39*  0.40*  0.39*  Problem List:  Patient Active Problem List   Diagnosis Date Noted  . Palliative care encounter   . Failure to wean ([place])   . Acute respiratory failure with hypoxemia ([place])   . Aspiration pneumonia ([place])   . Metastatic breast cancer ([place])   . Acute diastolic CHF (congestive heart failure) ([place]) 01/17/2015  . Respiratory failure ([place]) 01/15/2015  . Atrial flutter with rapid ventricular response ([place]) 01/15/2015  . Shock ([place])   . Acute respiratory failure with hypoxia ([place])   . Sepsis ([place]) 07/03/2014  . Fever 07/03/2014  . Sepsis due to pneumonia ([place]) 07/03/2014  . Chronic diarrhea 05/20/2014  . Rash of back 05/20/2014  . Anemia 05/20/2014  . History of  pulmonary embolism 03/25/2014  . Chest pain on breathing 03/25/2014  . Asthma 03/25/2014  . Hypokalemia 03/25/2014  . UTI (lower urinary tract infection) 03/25/2014  . Breast cancer ([place]) 10/15/2010     Palliative Care Assessment & Plan    Code Status:  DNR  Goals of Care:  Plan for one way extubation tomorrow.  I discussed this with patient and her husband.  They had questions regarding actual extubation procedure as well as concern to reassure that this plan was not a form of suicide.  We talked about use of medications for comfort, process of removing ET tube, and that this is not a form of suicide and that progression of her disease will be the cause of her death and not any decisions that she has made regarding discontinuation of aggressive medical support including mechanical ventilation.  Symptom Management:  Denies symptoms at this time.  Continue current regimen.  Psycho-social/Spiritual:  Desire for further Chaplaincy support:No   Prognosis: Plan for one way extubation on 11/14 Discharge Planning: Anticipated Hospital Death.  If she stabilizes, would be good candidate for home with hospice vs residential hospice facility   Care plan was discussed with patient and her husband  Thank you for allowing the Palliative Medicine Team to assist in the care of this patient.   Time In: 0910 Time Out: 0940 Total Time 30 Prolonged Time Billed  No    Greater than 50%  of this time was spent counseling and coordinating care related to the above assessment and plan.   Micheline Rough, MD  01/27/2015, 10:19 AM  Please contact Palliative Medicine Team phone at [phone] for questions and concerns.

## 2015-01-27 NOTE — Progress Notes (Signed)
[name] [name] for Enoxaparin Indication: History of PE (on therapeutic Lovenox PTA); atrial flutter  Allergies  Allergen Reactions  . Albuterol Shortness Of Breath    Tachycardia, feels like she is going to die  . Penicillins Other (See Comments)    Childhood reaction Has patient had a PCN reaction causing immediate rash, facial/tongue/throat swelling, SOB or lightheadedness with hypotension: unknown Has patient had a PCN reaction causing severe rash involving mucus membranes or skin necrosis: unknown Has patient had a PCN reaction that required hospitalization unknown Has patient had a PCN reaction occurring within the last 10 years: unknown If all of the above answers are "NO", then may proceed with Cephalosporin use.   . Albuterol Sulfate Other (See Comments)    TACHYCARDIA  . Codeine Nausea Only and Other (See Comments)    woosy  . Ibuprofen Other (See Comments)    Induces patient asthma  . Meperidine Nausea Only    Other reaction(s): Other (See Comments) "wiped out" and nausea  . Other     Unknown narcotic  . Phenobarbital Other (See Comments)    Erratic behavior, hyper, jittery  . Adhesive [Tape] Rash   Patient Measurements: Height: '5\' 2"'$  (157.5 cm) Weight: 167 lb 8.8 oz (76 kg) IBW/kg (Calculated) : 50.1 HEPARIN DW (KG): 65.9  Vital Signs: Temp: 98.4 F (36.9 C) (11/13 0800) Temp Source: Core (Comment) (11/13 0800) BP: 128/59 mmHg (11/13 0800) Pulse Rate: 67 (11/13 0303)  Labs:  Recent Labs  01/25/15 0535 01/26/15 0445 01/27/15 0625  HGB 9.6* 9.9*  --   HCT 31.3* 32.3*  --   PLT 359 347  --   CREATININE 0.39* 0.40* 0.39*   Estimated Creatinine Clearance: 59.8 mL/min (by C-G formula based on Cr of 0.39).  Medical History: Past Medical History  Diagnosis Date  . Asthma   . Breast pain in female     throbbing  . Abdominal pain   . Breast lump in female   . Cancer ([place])     rt  . PE (pulmonary embolism)  03/26/2014  . Neuropathy ([place])   . GERD (gastroesophageal reflux disease)   . History of hiatal hernia    Assessment: [name] [name] with PMH of metastatic breast cancer undergoing chemotherapy at [facility], PE on chronic Lovenox for anticoagulation, and GERD who presented to [facility] ED for evaluation of possible aspiration and SOB. Patient decompensated in ED and was intubated for respiratory failure. Patient also found to be in a-flutter.  Lovenox held on admission and pharmacy was consulted to assist with dosing of heparin infusion. Noted PTA dose of Lovenox was 50 mg SQ daily (dose was adjusted at [facility] per LMWH levels) with last dose reported as 10/31 at 2100. Patient converted back to Lovenox 50 mg daily on 11/10.  Today, 01/27/2015:  Hgb low but stable, Pltc WNL  SCr low but stable with CrCl > 30 ml/min  No bleeding reported in recent chart note.  Goal of Therapy:  Monitor platelets by anticoagulation protocol: Yes   Plan:   Continue Lovenox '50mg'$  SQ daily (this dose is based off of monitoring of LMWH anti-Xa levels at [facility])  Monitor  for s/s of bleeding  Pharmacy will sign off at this time as need for dosing changes is unlikely  Reuel Boom, PharmD, BCPS Pager: [phone] 01/27/2015, 8:38 AM

## 2015-01-27 NOTE — Progress Notes (Signed)
eLink Physician-Brief Progress Note Patient Name: [name] [name] DOB: 11/23/1940 MRN: [id number]   Date of Service  01/27/2015  HPI/Events of Note  QTc interval = 0.51. Patient is on Amiodarone for AFIB with RVR, however, she is currently in NSR with a rate = 70.Marland Kitchen  eICU Interventions  Will turn the Amiodarone IV infusion off for now. Observe rhythm and rate off Amiodarone for now.      Intervention Category Intermediate Interventions: Other:  Kaydince Towles Cornelia Copa 01/27/2015, 1:59 AM

## 2015-01-27 NOTE — Progress Notes (Signed)
[facility] Progress Note Patient Name: [name] [name] DOB: 23-Sep-1940 MRN: [id number]   Date of Service  01/27/2015  HPI/Events of Note  Husband concerned with low uop, nurse informs me plan for w/d care in am  eICU Interventions  Ok to increase IV rate from kvo to 30 cc per hour     Intervention Category Intermediate Interventions: Oliguria - evaluation and management  Christinia Gully 01/27/2015, 3:19 PM

## 2015-01-27 NOTE — Progress Notes (Signed)
Spiritual Assessment: Chaplain referral to see patient by staff. [name] Juline Patch is aware that with a one way ween and extubation her life will most likely end. She indicates through written and hand motions her desires. When asked if she is at peace she indicates that she is at peace. She indicates there are no spiritual matters she feels she has left undone. She indicates that she is greatly supported and comforted by her husband's presence in her room.  Interventions: Chaplain presence with family at the time of her extubation and possible death is strongly recommended. This will require chaplain to be informed when the extubation will occur in enough time to arrive and prepare the family for this event. Chaplains should be paged if [name] [name] needs or desires spiritual support at any time, as well as her family.  Sallee Lange. Elonda Giuliano, DMin, MDiv, MA Chaplain

## 2015-01-27 NOTE — Progress Notes (Signed)
PULMONARY / CRITICAL CARE MEDICINE   Name: [name] [name] MRN: [id number] DOB: [birth year]    ADMISSION DATE:  01/15/2015  REFERRING MD :  ED  CHIEF COMPLAINT:  Aspiration, vent dependent respiratory failure, sepsis, atrial flutter.  INITIAL PRESENTATION: 74 yo female presented with respiratory failure, a flutter with RVR.  Has hx of Stage IV breast cancer.   STUDIES:  11/02 Echo >> EF 55 to 60% 11/09 CT chest >> enlarging pulmonary nodules and masses, increased size/number pleural nodules/masses b/l with invasion into chest wall/bone, multiple lytic lesions  SIGNIFICANT EVENTS: 11/01 Admit, amio/heparin gtt, phenylephrine, abx 11/02 off pressors, sinus rhythm 11/03 start lasix 11/04 wheezing >> added ICS 11/05 d/c amiodarone, cardiology sign off 11/07 wean on 15/5 11/08 Af-RVR - amio gtt -nSR 11/13 amiodarone d/c'ed  SUBJECTIVE:  Did better last night.  Tolerated PS 12/5 for about 2.5 hrs yesterday.  Has mild discomfort in back of throat.  VITAL SIGNS: BP 128/59 mmHg  Pulse 67  Temp(Src) 98.4 F (36.9 C) (Core (Comment))  Resp 15  Ht '5\' 2"'$  (1.575 m)  Wt 167 lb 8.8 oz (76 kg)  BMI 30.64 kg/m2  SpO2 100% VENTILATOR SETTINGS: Vent Mode:  [-] PRVC FiO2 (%):  [30 %] 30 % Set Rate:  [15 bmp] 15 bmp Vt Set:  [400 mL] 400 mL PEEP:  [5 cmH20] 5 cmH20 Pressure Support:  [14 cmH20] 14 cmH20 Plateau Pressure:  [29 cmH20-30 cmH20] 30 cmH20 INTAKE / OUTPUT: I/O last 3 completed shifts: In: 2952 [I.V.:2242; NG/GT:710] Out: 1125 [Urine:1125]  PHYSICAL EXAMINATION: General: pleasant HEENT: ETT in place PULM: faint b/l crackles  CV: regular GI: non tender MSK: 1+ edema Derm: bruises extensor surfaces, ecchymosis Neuro: RASS 0, non focal, deconditioned  LABS:  CBC  Recent Labs Lab 01/24/15 0530 01/25/15 0535 01/26/15 0445  WBC 15.1* 17.0* 20.3*  HGB 9.2* 9.6* 9.9*  HCT 30.6* 31.3* 32.3*  PLT 325 359 347   BMET  Recent Labs Lab 01/25/15 0535  01/26/15 0445 01/27/15 0625  NA 137 133* 135  K 3.0* 3.1* 3.3*  CL 95* 91* 94*  CO2 37* 36* 35*  BUN '19 17 15  '$ CREATININE 0.39* 0.40* 0.39*  GLUCOSE 127* 114* 109*   Electrolytes  Recent Labs Lab 01/21/15 0445  01/23/15 0608 01/24/15 0530 01/25/15 0535 01/26/15 0445 01/27/15 0625  CALCIUM 8.7*  < > 8.7* 8.5* 8.4* 8.2* 8.6*  MG 1.9  --  1.8 1.6*  --   --  1.5*  PHOS 2.8  --  4.0 3.0  --   --   --   < > = values in this interval not displayed.   Glucose  Recent Labs Lab 01/26/15 1238 01/26/15 1512 01/26/15 1859 01/27/15 0011 01/27/15 0441 01/27/15 0809  GLUCAP 148* 125* 71 136* 89 120*    Imaging No results found.   ASSESSMENT / PLAN: PULMONARY ETT 11/1>> A: Acute respiratory failure 2nd to PNA (Aspiration vs HCAP) and pulmonary edema. Hx of PE from January 2016. Rt loculated pleural effusion >> likely malignant, present since 10/2014, increased burden of cancer in chest on CT. Hx of asthma with wheezing. Records from [place] reviewed 10/2014-bronchoscopy negative for infectious etiology. P:   Pressure support wean as tolerated Plan for one way extubation 11/14 Continue pulmicort, atrovent, xopenex Continue lasix 40 mg daily Tx dose lovenox PRN robinul for secretions  CARDIOVASCULAR Lt IJ CVL 11/1 >> Port >> A:  A fib/flutter with RVR >> in sinus rhythm. Septic shock. P: Monitor  heart rhythm off amiodarone Pressors to keep SBP > 90 PRN lopressor for HR > 130  RENAL A:  Hypokalemia. Hypomagnesemia. P:   F/u and replace electrolytes as needed Continue scheduled KCL while on lasix  GASTROINTESTINAL A:   Protein calorie malnutrition. Nausea. P:   Zofran prn  Continue tube feeds while on vent Protonix for SUP  HEMATOLOGIC/ONCOLOGY A:   Stage IV breast cancer. P:  F/u at [facility] >> discussed with Dr. Rip Harbour  INFECTIOUS A:   Septic shock 2nd to PNA, UTI >> completed abx 11/11. P:   Monitor off Abx  Urine 11/2 >> E  coli  ENDOCRINE A:   Relative adrenal insufficiency, on chronic steroids as outpt. P:   Stress dose solucortef  NEUROLOGIC A:   Cancer pain. P:   RASS goal 0  fentanyl gtt  GOALS of CARE No CPR, No defibrillation.    SUMMARY: Updated husband at bedside.  Not a candidate for tracheostomy and long term vent support >> appreciate  palliative care input. Plan to arrange for extubation on Monday 11/14.  Hope is to arrange for home hospice if she can sustain herself off vent.  Chesley Mires, MD [facility] Pulmonary/Critical Care 01/27/2015, 8:41 AM Pager:  [phone] After 3pm call: [phone]

## 2015-01-28 ENCOUNTER — Inpatient Hospital Stay (HOSPITAL_COMMUNITY): Payer: Medicare Other

## 2015-01-28 DIAGNOSIS — R0600 Dyspnea, unspecified: Secondary | ICD-10-CM

## 2015-01-28 LAB — BASIC METABOLIC PANEL
Anion gap: 8 (ref 5–15)
BUN: 12 mg/dL (ref 6–20)
CHLORIDE: 93 mmol/L — AB (ref 101–111)
CO2: 36 mmol/L — AB (ref 22–32)
CREATININE: 0.43 mg/dL — AB (ref 0.44–1.00)
Calcium: 8.7 mg/dL — ABNORMAL LOW (ref 8.9–10.3)
GFR calc Af Amer: >60 mL/min (ref >60)
GFR calc non Af Amer: >60 mL/min (ref >60)
GLUCOSE: 109 mg/dL — AB (ref 65–99)
POTASSIUM: 3.5 mmol/L (ref 3.5–5.1)
Sodium: 137 mmol/L (ref 135–145)

## 2015-01-28 LAB — GLUCOSE, CAPILLARY
GLUCOSE-CAPILLARY: 113 mg/dL — AB (ref 65–99)
Glucose-Capillary: 156 mg/dL — ABNORMAL HIGH (ref 65–99)

## 2015-01-28 LAB — CBC
HEMATOCRIT: 32.4 % — AB (ref 36.0–46.0)
Hemoglobin: 9.8 g/dL — ABNORMAL LOW (ref 12.0–15.0)
MCH: 29.5 pg (ref 26.0–34.0)
MCHC: 30.2 g/dL (ref 30.0–36.0)
MCV: 97.6 fL (ref 78.0–100.0)
PLATELETS: 374 10*3/uL (ref 150–400)
RBC: 3.32 MIL/uL — ABNORMAL LOW (ref 3.87–5.11)
RDW: 19.8 % — AB (ref 11.5–15.5)
WBC: 17.8 10*3/uL — ABNORMAL HIGH (ref 4.0–10.5)

## 2015-01-28 LAB — PHOSPHORUS: Phosphorus: 2.8 mg/dL (ref 2.5–4.6)

## 2015-01-28 LAB — MAGNESIUM: Magnesium: 1.9 mg/dL (ref 1.7–2.4)

## 2015-01-28 MED ORDER — ACETAMINOPHEN 650 MG RE SUPP: 650.0000 mg | Freq: Four times a day (QID) | RECTAL | Status: DC | PRN

## 2015-01-28 MED ORDER — LORAZEPAM 2 MG/ML IJ SOLN
0.5000 mg | INTRAMUSCULAR | Status: DC | PRN
  Administered 2015-01-28: 1 mg via INTRAVENOUS
  Filled 2015-01-28: qty 1

## 2015-01-28 NOTE — Progress Notes (Signed)
PULMONARY / CRITICAL CARE MEDICINE   Name: [name] [name] MRN: [id number] DOB: 07/24/1940    ADMISSION DATE:  01/15/2015  REFERRING MD :  ED  CHIEF COMPLAINT:  Aspiration, vent dependent respiratory failure, sepsis, atrial flutter.  INITIAL PRESENTATION: 74 yo female presented with respiratory failure, a flutter with RVR.  Has hx of Stage IV breast cancer.   STUDIES:  11/02 Echo >> EF 55 to 60% 11/09 CT chest >> enlarging pulmonary nodules and masses, increased size/number pleural nodules/masses b/l with invasion into chest wall/bone, multiple lytic lesions  SIGNIFICANT EVENTS: 11/01 Admit, amio/heparin gtt, phenylephrine, abx 11/02 off pressors, sinus rhythm 11/03 start lasix 11/04 wheezing >> added ICS 11/05 d/c amiodarone, cardiology sign off 11/07 wean on 15/5 11/08 Af-RVR - amio gtt -nSR 11/13 amiodarone d/c'ed, tolerated PSV 12/5 x 2.5 hours   SUBJECTIVE:  Pt alert, denies pain.  Reports frequent coughing / secretions with ETT.  Pilar Plate discussion regarding her wishes for withdrawal process.    VITAL SIGNS: BP 122/102 mmHg  Pulse 95  Temp(Src) 99 F (37.2 C) (Core (Comment))  Resp 17  Ht '5\' 2"'$  (1.575 m)  Wt 167 lb 8.8 oz (76 kg)  BMI 30.64 kg/m2  SpO2 100%   VENTILATOR SETTINGS: Vent Mode:  [-] PRVC FiO2 (%):  [30 %] 30 % Set Rate:  [15 bmp] 15 bmp Vt Set:  [400 mL] 400 mL PEEP:  [5 cmH20] 5 cmH20 Pressure Support:  [14 cmH20] 14 cmH20 Plateau Pressure:  [27 cmH20-35 cmH20] 27 cmH20   INTAKE / OUTPUT: I/O last 3 completed shifts: In: 2212.3 [I.V.:1482.3; NG/GT:680; IV Piggyback:50] Out: 2150 [Urine:2150]  PHYSICAL EXAMINATION: General: pleasant, no acute distress HEENT: ETT in place PULM: faint b/l crackles  CV: regular GI: non tender MSK: 1+ edema Derm: bruises extensor surfaces, ecchymosis Neuro: RASS 0, non focal, deconditioned  LABS:  CBC  Recent Labs Lab 01/25/15 0535 01/26/15 0445 01/28/15 0559  WBC 17.0* 20.3* 17.8*  HGB  9.6* 9.9* 9.8*  HCT 31.3* 32.3* 32.4*  PLT 359 347 374   BMET  Recent Labs Lab 01/26/15 0445 01/27/15 0625 01/28/15 0559  NA 133* 135 137  K 3.1* 3.3* 3.5  CL 91* 94* 93*  CO2 36* 35* 36*  BUN '17 15 12  '$ CREATININE 0.40* 0.39* 0.43*  GLUCOSE 114* 109* 109*   Electrolytes  Recent Labs Lab 01/23/15 0608 01/24/15 0530  01/26/15 0445 01/27/15 0625 01/28/15 0559  CALCIUM 8.7* 8.5*  < > 8.2* 8.6* 8.7*  MG 1.8 1.6*  --   --  1.5* 1.9  PHOS 4.0 3.0  --   --   --  2.8  < > = values in this interval not displayed.   Glucose  Recent Labs Lab 01/27/15 0809 01/27/15 1311 01/27/15 1655 01/27/15 2018 01/28/15 0408 01/28/15 0759  GLUCAP 120* 165* 162* 112* 156* 113*    Imaging Dg Chest Port 1 View  01/28/2015  CLINICAL DATA:  Respiratory failure. History of breast cancer. Intubated patient. EXAM: PORTABLE CHEST 1 VIEW COMPARISON:  Single view of the chest 01/25/2015 and 01/24/2015. CT chest 01/23/2015. FINDINGS: Support tubes and lines are unchanged. Right greater than left pleural effusions persist. Bilateral pulmonary nodules are again seen. No pneumothorax. IMPRESSION: Support tubes and lines projecting good position. Bilateral pleural effusion pulmonary metastases. No new abnormality. Electronically Signed   By: Inge Rise M.D.   On: 01/28/2015 07:20     ASSESSMENT / PLAN: PULMONARY ETT 11/1>> A: Acute respiratory failure 2nd to PNA (  Aspiration vs HCAP) and pulmonary edema. Hx of PE from January 2016. Rt loculated pleural effusion >> likely malignant, present since 10/2014, increased burden of cancer in chest on CT. Hx of asthma with wheezing. Records from [place] reviewed 10/2014 - bronchoscopy negative for infectious etiology. P:   Pressure support wean as tolerated Plan for one way extubation 11/14 after family has visited / ready Continue pulmicort, atrovent, xopenex Discontinue lasix 40 mg after 11/14 dosing  Tx dose lovenox, consider d/c 11/15 PRN robinul  for secretions  CARDIOVASCULAR Lt IJ CVL 11/1 >> Port >> A:  A fib/flutter with RVR >> in sinus rhythm. Septic shock. P: Monitor heart rhythm off amiodarone Wean neo to off over next hour, no restart of vasopressors  PRN lopressor for HR > 130  RENAL A:  Hypokalemia. Hypomagnesemia. P:   F/u and replace electrolytes as needed  GASTROINTESTINAL A:   Protein calorie malnutrition. Nausea. P:   Zofran / phenergan PNR  Hold further tube feeding 11/14 with planned extubation  Protonix for SUP  HEMATOLOGIC/ONCOLOGY A:   Stage IV breast cancer - followed at [facility] with Dr. Rip Harbour  P:  Supportive care, transition to comfort 11/14  INFECTIOUS A:   Septic shock 2nd to PNA, UTI >> completed abx 11/11. P:   Monitor off Abx  Urine 11/2 >> E coli  ENDOCRINE A:   Relative adrenal insufficiency, on chronic steroids as outpt. P:   Stress dose solu-cortef, consider d/c as patient transitions  NEUROLOGIC A:   Cancer pain. Anxiety  P:   RASS goal 0 Fentanyl gtt for pain  PRN ativan for anxiety   GOALS of CARE:  DNR/DNI.  No escalation of care 11/14.  Comfort measures only.  Continue medical therapy while patient awake and transition to off as she transitions.   SUMMARY: Updated husband and patientat bedside.  Not a candidate for tracheostomy and long term vent support >> appreciate  palliative care input. Plan for one way extubation 11/14 when family ready at bedside.  Wean pressors to off over next hour.      Noe Gens, NP-C [place] [facility] & [facility] Pgr: [phone] or if no answer [phone] 01/28/2015, 9:24 AM

## 2015-01-28 NOTE — Progress Notes (Signed)
[facility] notified of patients plan of care - Dr. Rip Harbour (primary [place]).     Noe Gens, NP-C [place] [facility] Pgr: [phone] or if no answer [phone] 01/28/2015, 10:29 AM

## 2015-01-28 NOTE — Progress Notes (Signed)
Date: January 28, 2015 Chart reviewed for concurrent status and case management needs. Will continue to follow patient for changes and needs: Velva Harman, RN, BSN, [place]   [phone]

## 2015-01-28 NOTE — Procedures (Signed)
Extubation Procedure Note  Patient Details:   Name: [name] [name] DOB: Aug 15, 1940 MRN: [id number]   Airway Documentation:     Evaluation  O2 sats: stable throughout Complications: No apparent complications Patient did tolerate procedure well. Bilateral Breath Sounds: Diminished Suctioning: Airway Yes  Patient terminally extubated per MD order and family wishes. Patient was stable throughout and placed on a [place] at 2L. No complications noted. RT will continue to monitor patient.   Lamonte Sakai 01/28/2015, 12:41 PM

## 2015-01-28 NOTE — Progress Notes (Signed)
Daily Progress Note   Patient Name: [name] [name]       Date: 01/28/2015 DOB: 1940-05-21  Age: 74 y.o. MRN#: [id number] Attending Physician: Marshell Garfinkel, MD Primary Care Physician: Cari Caraway, MD Admit Date: 01/15/2015  Reason for Consultation/Follow-up: Terminal Care  Subjective: Patient seen approximately 20-30 minutes following extubation. She is resting comfortablly with family in room at that time. I met with family in room and we discussed plan for ensuring comfort moving forward. They report they have not seen any signs of distress and are happy with care plan since extubation.  Length of Stay: 13 days  Current Medications: Scheduled Meds:  . antiseptic oral rinse  7 mL Mouth Rinse QID  . budesonide (PULMICORT) nebulizer solution  0.5 mg Nebulization BID  . chlorhexidine gluconate  15 mL Mouth Rinse BID  . enoxaparin (LOVENOX) injection  50 mg Subcutaneous Daily  . hydrocortisone sod succinate (SOLU-CORTEF) inj  50 mg Intravenous Q12H  . ipratropium  0.5 mg Nebulization Q6H  . levalbuterol  0.63 mg Nebulization Q6H  . nystatin  5 mL Oral QID    Continuous Infusions: . sodium chloride 30 mL/hr at 01/27/15 1700  . fentaNYL infusion INTRAVENOUS 25 mcg/hr (01/28/15 1800)    PRN Meds: acetaminophen, bisacodyl, fentaNYL, glycopyrrolate, LORazepam, magic mouthwash, metoprolol, ondansetron (ZOFRAN) IV, promethazine, sennosides  Palliative Performance Scale: 10%     Vital Signs: BP 117/50 mmHg  Pulse 95  Temp(Src) 97.9 F (36.6 C) (Core (Comment))  Resp 17  Ht 5' 2" (1.575 m)  Wt 76 kg (167 lb 8.8 oz)  BMI 30.64 kg/m2  SpO2 98% SpO2: SpO2: 98 % O2 Device: O2 Device: Nasal Cannula O2 Flow Rate: O2 Flow Rate (L/min): 2 L/min  Intake/output summary:  Intake/Output Summary (Last 24 hours) at 01/28/15 1856 Last data filed at 01/28/15 1800  Gross per 24 hour  Intake    955 ml  Output   1475 ml  Net   -520 ml   LBM:   Baseline Weight: Weight: 74.39  kg (164 lb) Most recent weight: Weight: 76 kg (167 lb 8.8 oz)  Physical Exam: Gen.: Resting comfortably with no distress. Does not arouse or open eyes to verbal stimulation  Heart: Regular rate Lungs: Diminished  Abdomen soft, nontender     Skin: Warm and dry        Additional Data Reviewed: Recent Labs     01/26/15  0445  01/27/15  0625  01/28/15  0559  WBC  20.3*   --   17.8*  HGB  9.9*   --   9.8*  PLT  347   --   374  NA  133*  135  137  BUN  _0 CREATININE  0.40*  0.39*  0.43*     Problem List:  Patient Active Problem List   Diagnosis Date Noted  . 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 Palliative care encounter   . Failure to wean ([place])   . Acute respiratory failure with hypoxemia ([place])   . Aspiration pneumonia ([place])   . Metastatic breast cancer ([place])   . Acute diastolic CHF (congestive heart failure) ([place]) 01/17/2015  . Respiratory failure ([place]) 01/15/2015  . Atrial flutter with rapid ventricular response ([place]) 01/15/2015  . Shock ([place])   . Acute respiratory failure with hypoxia ([place])   . Sepsis ([place]) 07/03/2014  . Fever 07/03/2014  . Sepsis due to pneumonia ([place]) 07/03/2014  . Chronic diarrhea 05/20/2014  .  Rash of back 05/20/2014  . Anemia 05/20/2014  . History of pulmonary embolism 03/25/2014  . Chest pain on breathing 03/25/2014  . Asthma 03/25/2014  . Hypokalemia 03/25/2014  . UTI (lower urinary tract infection) 03/25/2014  . Breast cancer ([place]) 10/15/2010     Palliative Care Assessment & Plan    Code Status:  DNR  Goals of Care:  Status post one-way extubation today  Symptom Management:  Resting comfortably at this time  Pain/shortness of breath: Continue fentanyl infusion as well as when necessary dosing. Plan to titrate as needed to ensure her comfort.  Anxiety: Ativan as needed  Excess secretions: Robinul as needed   Prognosis: Hours to days Discharge Planning: Anticipated Hospital Death. If she stabilizes,  would be good candidate for home with hospice vs residential hospice facility  Psycho-social/Spiritual:   Desire for further Chaplaincy support: Already involved in case  Care plan was discussed with patient's family, bedside nurse, and Noe Gens, NP.  Thank you for allowing the Palliative Medicine Team to assist in the care of this patient.   Time In: 1230 Time Out: 1300 Total Time 30 Prolonged Time Billed no    Greater than 50%  of this time was spent counseling and coordinating care related to the above assessment and plan.   Micheline Rough, MD  01/28/2015, 6:56 PM  Please contact Palliative Medicine Team phone at [phone] for questions and concerns.

## 2015-01-28 NOTE — Progress Notes (Signed)
   01/28/15 1000  Clinical Encounter Type  Visited With Patient and family together  Visit Type Initial;Psychological support;Spiritual support;Critical Care  Referral From Nurse  Consult/Referral To Chaplain  Spiritual Encounters  Spiritual Needs Emotional;Grief support;Other (Comment) (Pastoral Conversation/Presence)   The Chaplain visited with the patient per referral from the weekend Chaplain. The nurse stated that the Chaplain would be helpful upon arrival. The Chaplain encountered a family member before entering the room who had requested extra tissues, which the Chaplain provided.  The Chaplain checked in with the large group of family members in the room who were spending time with the patient. The patient was writing something down for a relative to read.  The Chaplain assessed that the patient and family were not in need of spiritual support at this time, but would require a follow-up during the time of the patient's extubation.  The Chaplain spoke with the PA who stated that they would be extubating the patient in approximately an hour. The Charge Nurse will page the Chaplain when the extubation occurs.   [place]

## 2015-01-29 MED ORDER — MAGIC MOUTHWASH: 5.0000 mL | Freq: Four times a day (QID) | ORAL | Status: AC | PRN

## 2015-01-29 MED ORDER — LORAZEPAM 2 MG/ML PO CONC: 1.0000 mg | ORAL | Status: AC | PRN

## 2015-01-29 MED ORDER — ATROPINE SULFATE 1 % OP SOLN: 1.0000 [drp] | Freq: Four times a day (QID) | OPHTHALMIC | Status: AC | PRN

## 2015-01-29 MED ORDER — MORPHINE SULFATE (CONCENTRATE) 10 MG/0.5ML PO SOLN: 20.0000 mg | ORAL | Status: DC | PRN

## 2015-01-29 MED ORDER — CHLORHEXIDINE GLUCONATE 0.12% ORAL RINSE (MEDLINE KIT): 15.0000 mL | Freq: Two times a day (BID) | OROMUCOSAL | Status: AC

## 2015-01-29 MED ORDER — ACETAMINOPHEN 650 MG RE SUPP: 650.0000 mg | Freq: Four times a day (QID) | RECTAL | Status: AC | PRN

## 2015-01-29 MED ORDER — PREDNISONE 5 MG/5ML PO SOLN: 30.0000 mg | Freq: Every day | ORAL | Status: AC

## 2015-01-29 MED ORDER — CHLORHEXIDINE GLUCONATE 0.12 % MT SOLN
OROMUCOSAL | Status: AC
  Filled 2015-01-29: qty 15

## 2015-01-29 MED ORDER — FENTANYL 50 MCG/HR TD PT72
50.0000 ug | MEDICATED_PATCH | TRANSDERMAL | Status: DC
  Administered 2015-01-29: 50 ug via TRANSDERMAL
  Filled 2015-01-29: qty 1

## 2015-01-29 MED ORDER — MORPHINE SULFATE (CONCENTRATE) 10 MG/0.5ML PO SOLN: 20.0000 mg | ORAL | Status: AC | PRN

## 2015-01-29 MED ORDER — SENNOSIDES 8.8 MG/5ML PO SYRP: 5.0000 mL | ORAL_SOLUTION | Freq: Two times a day (BID) | ORAL | Status: AC | PRN

## 2015-01-29 MED ORDER — FENTANYL 50 MCG/HR TD PT72: 50.0000 ug | MEDICATED_PATCH | TRANSDERMAL | Status: AC

## 2015-01-29 MED ORDER — LEVALBUTEROL HCL 0.63 MG/3ML IN NEBU: 0.6300 mg | INHALATION_SOLUTION | Freq: Four times a day (QID) | RESPIRATORY_TRACT | Status: AC

## 2015-01-29 MED ORDER — BISACODYL 10 MG RE SUPP: 10.0000 mg | Freq: Every day | RECTAL | Status: AC | PRN

## 2015-01-29 MED ORDER — ATROPINE SULFATE 1 % OP SOLN
1.0000 [drp] | Freq: Four times a day (QID) | OPHTHALMIC | Status: DC | PRN
  Filled 2015-01-29: qty 2

## 2015-01-29 MED ORDER — HALOPERIDOL LACTATE 2 MG/ML PO CONC: 1.0000 mg | Freq: Four times a day (QID) | ORAL | Status: AC | PRN

## 2015-01-29 MED ORDER — LORAZEPAM 2 MG/ML PO CONC
1.0000 mg | ORAL | Status: DC | PRN
  Administered 2015-01-29 – 2015-01-30 (×3): 1 mg via ORAL
  Filled 2015-01-29 (×3): qty 1

## 2015-01-29 MED ORDER — HALOPERIDOL LACTATE 2 MG/ML PO CONC
1.0000 mg | Freq: Four times a day (QID) | ORAL | Status: DC | PRN
  Filled 2015-01-29: qty 0.5

## 2015-01-29 NOTE — Progress Notes (Signed)
Date: January 29, 2015 Chart reviewed for concurrent status and case management needs. Will continue to follow patient for changes and needs: equipment and rn will be provided by [facility]. Velva Harman, RN, BSN, [place]   [phone]

## 2015-01-29 NOTE — Progress Notes (Signed)
Date: January 29, 2015 Chart reviewed for concurrent status and case management needs. Will continue to follow patient for changes and needs:  Information faxed to [facility] at [place], [place], [place], [place]   [phone]

## 2015-01-29 NOTE — Progress Notes (Signed)
Daily Progress Note   Patient Name: [name] [name]       Date: 01/29/2015 DOB: 01-11-1941  Age: 74 y.o. MRN#: [id number] Attending Physician: Marshell Garfinkel, MD Primary Care Physician: Cari Caraway, MD Admit Date: 01/15/2015  Reason for Consultation/Follow-up: Terminal Care  Subjective: Patient seen this AM in conjunction with critical care NP.  She reports feeling well this AM.  Has some SOB when tried to decrease fentanyl further overnight.  Doing well on 58mg/hr.    Her husband is at bedside and they are in agreement with pursuing d/c to her daughter's house today with hospice support.  No other concerns this morning.  Length of Stay: 14 days  Current Medications: Scheduled Meds:  . antiseptic oral rinse  7 mL Mouth Rinse QID  . budesonide (PULMICORT) nebulizer solution  0.5 mg Nebulization BID  . chlorhexidine gluconate  15 mL Mouth Rinse BID  . enoxaparin (LOVENOX) injection  50 mg Subcutaneous Daily  . fentaNYL  50 mcg Transdermal Q72H  . hydrocortisone sod succinate (SOLU-CORTEF) inj  50 mg Intravenous Q12H  . ipratropium  0.5 mg Nebulization Q6H  . levalbuterol  0.63 mg Nebulization Q6H  . nystatin  5 mL Oral QID    Continuous Infusions: . sodium chloride 30 mL/hr at 01/29/15 0059  . fentaNYL infusion INTRAVENOUS 50 mcg/hr (01/29/15 0257)    PRN Meds: acetaminophen, atropine, bisacodyl, fentaNYL, haloperidol, LORazepam, magic mouthwash, metoprolol, morphine CONCENTRATE, ondansetron (ZOFRAN) IV, promethazine, sennosides  Palliative Performance Scale: 20%     Vital Signs: BP 117/50 mmHg  Pulse 102  Temp(Src) 98.8 F (37.1 C) (Core (Comment))  Resp 23  Ht '5\' 2"'$  (1.575 m)  Wt 76 kg (167 lb 8.8 oz)  BMI 30.64 kg/m2  SpO2 100% SpO2: SpO2: 100 % O2 Device: O2 Device: Nasal Cannula O2 Flow Rate: O2 Flow Rate (L/min): 4 L/min  Intake/output summary:   Intake/Output Summary (Last 24 hours) at 01/29/15 0913 Last data filed at 01/29/15 0800  Gross per  24 hour  Intake 555.18 ml  Output   1550 ml  Net -994.82 ml   LBM:   Baseline Weight: Weight: 74.39 kg (164 lb) Most recent weight: Weight: 76 kg (167 lb 8.8 oz)  Physical Exam: Gen.: Resting comfortably with no distress. Awake, alert, and conversational (with soft voice)  ENT: mucus membranes tacky CV: Regular rate and rhythm  Abdomen nondistended     Skin: Warm and dry        Additional Data Reviewed: Recent Labs     01/27/15  0625  01/28/15  0559  WBC   --   17.8*  HGB   --   9.8*  PLT   --   374  NA  135  137  BUN  15  12  CREATININE  0.39*  0.43*     Problem List:  Patient Active Problem List   Diagnosis Date Noted  . 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 Palliative care encounter   . Failure to wean ([place]   . Acute respiratory failure with hypoxemia ([place]   . Aspiration pneumonia ([place]   . Metastatic breast cancer ([place]   . Acute diastolic CHF (congestive heart failure) (HT[place] 01/17/2015  . Respiratory failure ([place] 01/15/2015  . Atrial flutter with rapid ventricular response ([place] 01/15/2015  . Shock (HB[place]   . Acute respiratory failure with hypoxia ([place]   . Sepsis ([place] 07/03/2014  . Fever 07/03/2014  . Sepsis due to pneumonia (HC[place] 07/03/2014  .  Chronic diarrhea 05/20/2014  . Rash of back 05/20/2014  . Anemia 05/20/2014  . History of pulmonary embolism 03/25/2014  . Chest pain on breathing 03/25/2014  . Asthma 03/25/2014  . Hypokalemia 03/25/2014  . UTI (lower urinary tract infection) 03/25/2014  . Breast cancer ([place]) 10/15/2010     Palliative Care Assessment & Plan    Code Status:  DNR  Goals of Care:  Status post one-way extubation today  Symptom Management:  Resting comfortably at this time  Pain/shortness of breath: Currently on Fentanyl infusion @ 3mg/hr.  For discharge, recommend addition of fentanyl patch 558m and would d/c fentanyl infusion 12 hours after initiation of patch if she is still present the hospital at  that time.  Also recommend addition of morphine concentrate as rescue medication.  Oral morphine equivalent for her 24 hour usage of fentanyl is '360mg'$ .  Would therefore recommend starting with rescue dose of morphine concentrate '20mg'$  sublingual Q2hrs as needed (approx 10% of 24 hours use with 40% reduction for cross-tolerance).   Anxiety: Ativan as needed  Agitation/nausea: Haldol on discharge as needed.  Excess secretions: Robinul as needed.  Transition to atropine drops on d/c.   Prognosis: Hours to days.   Discharge Planning: Home with hospice support Psycho-social/Spiritual:   Desire for further Chaplaincy support: Already involved in case  Care plan was discussed with patient, her husband, and BrNoe GensNP.  Thank you for allowing the Palliative Medicine Team to assist in the care of this patient.   Time In: 0845 Time Out: 0910 Total Time 25 Prolonged Time Billed no    Greater than 50%  of this time was spent counseling and coordinating care related to the above assessment and plan.   GeMicheline RoughMD  01/29/2015, 9:13 AM  Please contact Palliative Medicine Team phone at [phone] questions and concerns.

## 2015-01-29 NOTE — Discharge Instructions (Signed)
1.  [name] [name] will be contacting you for follow up at home.   2.  Pain regimen - Fentanyl patch is first line for maintenance pain control.  If Mrs. Schlachter is       experiencing break through pain or shortness of breath, use the liquid morphine as needed.    3.  Anxiety - use the liquid ativan as prescribed for anxiety   4.  Nausea / Agitation - use the liquid haldol for nausea or agitation  5.  Increased secretions - use the atropine drops under the tongue for increased secretions  6.  Comfort feeding as tolerated/patient requested.  If possible, ensure she is upright when eating.

## 2015-01-29 NOTE — Discharge Summary (Signed)
Physician Discharge Summary  Patient ID: TASHANTI DALPORTO MRN: [id number] DOB/AGE: May 29, 1940 74 y.o.  Admit date: 01/15/2015 Discharge date: 01/29/2015    Discharge Diagnoses:  Acute Respiratory Failure secondary to PNA Hx PE (03/2014) Right Loculated Pleural Effusion - suspected malignant  Hx Asthma Atrial Fibrillation / Flutter - resolved.  Septic Shock - resolved, in setting of PNA and E-Coli UTI  Hypokalemia  Hypomagnesemia  Protein Calorie Malnutrition  Nausea  Stage IV Breast Cancer - followed at [facility] by Dr. 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 Hx of PE from January 2016. Rt loculated pleural effusion >> likely malignant, present since 10/2014, increased burden of cancer in chest on CT. Hx of asthma with wheezing. Records from [place] reviewed 10/2014 - bronchoscopy negative for infectious etiology.  Discharge Plan: Oxygen 2L for comfort  Continue Q6 xopenex HOLD further lasix dosing at discharge given recent hypotension and comfort goals.  May need to be restarted (pt was having difficulty swallowing potassium that she required with lasix dosing) D/C further lovenox  PRN atropine gtt's for secretions   A fib/flutter with RVR >> in sinus rhythm. Septic shock.  Discharge Plan: No further interventions.     Hypokalemia. Hypomagnesemia.  Discharge Plan:  Hold further lasix as above No further lab draws / comfort care   Protein calorie malnutrition. Nausea.  Discharge Plan: PRN haldol for nausea Comfort feeding as tolerated  Oral care as needed    Stage IV breast cancer - followed at [facility] with Dr. Rip Harbour   Discharge Plan: Supportive care.  Dr. Rip Harbour notified of  transition to comfort.      Relative adrenal insufficiency, on chronic steroids as outpt.  Discharge Plan: Continue oral liquid prednisone while patient awake/alert.  Transition to off as mental status changes.    Cancer pain. Anxiety   Discharge Plan: Fentanyl 50 mcg patch for maintenance pain control  PRN Roxanol for break through pain  PRN ativan for anxiety  Palliative care will follow at home.                  DISCHARGE SUMMARY   Canna LONIA ROANE is a 74 y.o. 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 Prior to admit, she was receiving palliative chemotherapy with Gemzar started on 10/04/14. She also has history of PE in January 2016 and was on lovenox.  She was admitted in April 2016 with an influenza, HCAP, postobstructive pneumonia. She was bronched at [facility] for that but no bacterial organism identified. She is on Bactrim as an outpatient for unclear reasons.   On admit 11/1, her husband reported she had been declining rapidly over the past few weeks with with weakness, loss of appetite malaise and very poor PO intake. She was found to be hypotensive,  in atrial flutter/fibrillation and respiratory distress.  She was intubated and admitted to ICU.  The patient was treated with vasopressors, stress dose steroids, empiric antibiotics, and volume resuscitation.  Pan cultures grew E-Coli in the urine (S: rocephin).  Antibiotics were narrowed.  She was weaned off vasopressors.  Cardiology was consulted for PAF and the patient was briefly treated with amiodarone.  ECHO was assessed which demonstrated an LVEF of 55-60%.  She ultimately converted to NSR.  Follow up CT of the chest was evaluated on 11/9 which demonstrated enlarging pulmonary nodules and masses, increased size/number pleural nodules/masses b/l with invasion into chest  wall/bone, & multiple lytic lesions. She made slow progress with weaning efforts.  Palliative care was consulted to discuss goals of care with patient and family given concerns of increased cancer burden despite therapy.  Patient and family participated in goals of care discussion and requested extubation.  The patient was extubated on 11/14 with plan for comfort focused care.  Her ultimate goal is to return home with her family.  She tolerated extubation and remained stable from a pain standpoint on 50 mcg of IV fentanyl /hr.  Prior to discharge, she was transitioned to fentanyl patch in anticipation for discharge.       STUDIES:  11/02 ECHO >> EF 55 to 60% 11/09 CT chest >> enlarging pulmonary nodules and masses, increased size/number pleural nodules/masses b/l with invasion into chest wall/bone, multiple lytic lesions  SIGNIFICANT EVENTS: 11/01  Admit, amio/heparin gtt, phenylephrine, abx 11/02  off pressors, sinus rhythm 11/03  start lasix 11/04  wheezing >> added ICS 11/05  d/c amiodarone, cardiology sign off 11/07  wean on 15/5 11/08  Af-RVR - amio gtt -nSR 11/13  amiodarone d/c'ed, tolerated PSV 12/5 x 2.5 hours  MICRO DATA  Urine Culture 11/2 >> E-Coli (completed abx while inpatient)  CONSULTS Palliative Care  Cardiology   TUBES / LINES OETT 11/1 >> 11/14  L IJ TLC 11/1 >> 11/15  Port-A-Cath >>    Discharge Exam: General: chronically ill appearing female in NAD Neuro: Awake, alert, speech clear, generalized weakness but moving all extremities, deconditioned  CV: s1s2 rrr, no m/r/g PULM: even/non-labored, lungs bilaterally with faint bilateral lower crackles  GI: NTND, bsx4 active  Extremities: warm/dry, generalized edema, scattered ecchymosis on BUE's  Filed Vitals:   01/29/15 0700 01/29/15 0745 01/29/15 0754 01/29/15 0800  BP:      Pulse:  102    Temp: 98.8 F (37.1 C)     TempSrc:      Resp: $Remo'14 22  23  'VpFKL$ Height:      Weight:      SpO2: 100% 98% 100% 100%      Discharge Labs  BMET  Recent Labs Lab 01/23/15 0608 01/24/15 0530 01/25/15 0535 01/26/15 0445 01/27/15 0625 01/28/15 0559  NA 145 141 137 133* 135 137  K 3.4* 3.0* 3.0* 3.1* 3.3* 3.5  CL 101 97* 95* 91* 94* 93*  CO2 37* 38* 37* 36* 35* 36*  GLUCOSE 122* 157* 127* 114* 109* 109*  BUN 25* 22* $Remov'19 17 15 12  'DJwqNP$ CREATININE 0.47 0.43* 0.39* 0.40* 0.39* 0.43*  CALCIUM 8.7* 8.5* 8.4* 8.2* 8.6* 8.7*  MG 1.8 1.6*  --   --  1.5* 1.9  PHOS 4.0 3.0  --   --   --  2.8   CBC  Recent Labs Lab 01/25/15 0535 01/26/15 0445 01/28/15 0559  HGB 9.6* 9.9* 9.8*  HCT 31.3* 32.3* 32.4*  WBC 17.0* 20.3* 17.8*  PLT 359 347 374    Discharge Instructions    Call MD for:  persistant nausea and vomiting    Complete by:  As directed      Call MD for:  severe uncontrolled pain    Complete by:  As directed      Diet general    Complete by:  As directed   Comfort feeding as tolerated.     Discharge instructions    Complete by:  As directed   1.  [place] will be contacting you for follow up at home.  2.  Pain regimen - Fentanyl patch is first line for maintenance pain control.  If Mrs. Facchini is experiencing break through pain or shortness of breath, use the liquid morphine as needed.   3.  Anxiety - use the liquid ativan as prescribed for anxiety  4.  Nausea / Agitation - use the liquid haldol for nausea or agitation 5.  Increased secretions - use the atropine drops under the tongue for increased secretions 6.  Comfort feeding as tolerated/patient requested.  If possible, ensure she is upright when eating.     Increase activity slowly    Complete by:  As directed             Follow-up Information    Follow up with [facility] at the [place].   Why:  They will call you for a home visit and to deliver home equippment.   Contact information:   [facility]         Medication List    STOP taking these medications         acetaminophen 325 MG tablet  Commonly known as:  TYLENOL  Replaced by:  acetaminophen 650 MG suppository     beclomethasone 40 MCG/ACT inhaler  Commonly known as:  QVAR     benzonatate 100 MG capsule  Commonly known as:  TESSALON     CALCIUM + D PO     clotrimazole 1 % cream  Commonly known as:  LOTRIMIN     enoxaparin 60 MG/0.6ML injection  Commonly known as:  LOVENOX     furosemide 20 MG tablet  Commonly known as:  LASIX     Glucosamine HCl-MSM 1500-500 MG/30ML Liqd     guaiFENesin-dextromethorphan 100-10 MG/5ML syrup  Commonly known as:  ROBITUSSIN DM     ipratropium 0.02 % nebulizer solution  Commonly known as:  ATROVENT     levofloxacin 750 MG/150ML Soln  Commonly known as:  LEVAQUIN     lidocaine-prilocaine cream  Commonly known as:  EMLA     menthol-cetylpyridinium 3 MG lozenge  Commonly known as:  CEPACOL     multivitamin with minerals Tabs tablet     nystatin 100000 UNIT/ML suspension  Commonly known as:  MYCOSTATIN     ondansetron 4 MG tablet  Commonly known as:  ZOFRAN     predniSONE 10 MG tablet  Commonly known as:  DELTASONE  Replaced by:  predniSONE 5 MG/5ML solution     PRESCRIPTION MEDICATION     ranitidine 150 MG tablet  Commonly known as:  ZANTAC     sulfamethoxazole-trimethoprim 400-80 MG tablet  Commonly known as:  BACTRIM,SEPTRA     urea 10 % cream  Commonly known as:  CARMOL     vancomycin 1 GM/200ML Soln  Commonly known as:  VANCOCIN      TAKE these medications  acetaminophen 650 MG suppository  Commonly known as:  TYLENOL  Place 1 suppository (650 mg total) rectally every 6 (six) hours as needed for fever, mild pain or moderate pain.     atropine 1 % ophthalmic solution  Place 1 drop under the tongue 4 (four) times daily as needed (secretions).     bisacodyl 10 MG suppository  Commonly known as:  DULCOLAX  Place 1 suppository (10 mg total) rectally daily as needed for moderate constipation.      chlorhexidine gluconate 0.12 % solution  Commonly known as:  PERIDEX  15 mLs by Mouth Rinse route 2 (two) times daily.     fentaNYL 50 MCG/HR  Commonly known as:  DURAGESIC - dosed mcg/hr  Place 1 patch (50 mcg total) onto the skin every 3 (three) days.  Start taking on:  01/19/2015     haloperidol 2 MG/ML solution  Commonly known as:  HALDOL  Take 0.5 mLs (1 mg total) by mouth every 6 (six) hours as needed for agitation (or nausea).     levalbuterol 0.63 MG/3ML nebulizer solution  Commonly known as:  XOPENEX  Take 3 mLs (0.63 mg total) by nebulization every 6 (six) hours.     LORazepam 2 MG/ML concentrated solution  Commonly known as:  ATIVAN  Take 0.5 mLs (1 mg total) by mouth every 4 (four) hours as needed for anxiety.     magic mouthwash Soln  Take 5 mLs by mouth every 6 (six) hours as needed for mouth pain.     morphine CONCENTRATE 10 MG/0.5ML Soln concentrated solution  Take 1 mL (20 mg total) by mouth every 2 (two) hours as needed for moderate pain, severe pain or shortness of breath.     predniSONE 5 MG/5ML solution  Take 30 mLs (30 mg total) by mouth daily with breakfast.     sennosides 8.8 MG/5ML syrup  Commonly known as:  SENOKOT  Take 5 mLs by mouth 2 (two) times daily as needed for mild constipation.     TUMS ULTRA 1000 400 MG chewable tablet  Generic drug:  calcium elemental as carbonate  Chew 1,000-2,000 mg by mouth 3 (three) times daily as needed for heartburn.          Disposition:  Home with home hospice.  Patient pending discharge to daughter's home in [place], [place] [facility] @ [phone]).  She will be followed by [facility] at the [facility] [phone]).  Discharged Condition: Shahidah ZAYDA ANGELL has met maximum benefit of inpatient care and is medically stable and cleared for discharge.  Patient is pending follow up as above.      Time spent on disposition:  Greater than 35 minutes.   Signed: Noe Gens, NP-C [facility] Pgr: [phone] Office: [phone]

## 2015-01-29 NOTE — Plan of Care (Signed)
Problem: Phase II Progression Outcomes Goal: Time pt extubated/weaned off vent Outcome: Completed/Met Date Met:  01/28/15 Around 12 o'clock noon

## 2015-01-29 NOTE — Progress Notes (Signed)
Medical necessity form  / DNR form in chart for [facility] transport tomorrow.   Werner Lean LCSW [phone]

## 2015-01-30 ENCOUNTER — Telehealth: Payer: Self-pay | Admitting: Pulmonary Disease

## 2015-01-30 NOTE — Progress Notes (Signed)
[id number]/NGFREV Saleah Rishel,RN,BSN,CCM:patient discharged to home with [facility] via [facility] transportation/

## 2015-01-30 NOTE — Telephone Encounter (Signed)
Brandi called this rx in.  Aaron Edelman Needs [place] to call hospice to authorize to despinse fentaNYL (DURAGESIC - DOSED MCG/HR) 50 MCG/HR. Call hospice pharmacy at [phone]

## 2015-01-30 NOTE — Progress Notes (Signed)
Family requested Foley remain in place upon discharge with hospice. Per MD pt to go home with Foley in place.

## 2015-01-31 NOTE — Telephone Encounter (Signed)
Called and spoke to [place] at hospice. Aaron Edelman stated the issue has been taken care of with her insurance. Nothing further needed at this time.

## 2015-02-14 DEATH — deceased

## 2016-03-12 IMAGING — CT CT ANGIO CHEST
1 of 2 series · 18 of 32 positions shown · IV contrast (OMNIPAQUE 300)
Comparison: Chest radiograph June 19, 2013

CLINICAL DATA: Pleuritic chest pain

EXAM:
CT ANGIOGRAPHY CHEST WITH CONTRAST
TECHNIQUE: Multidetector CT imaging of the chest was performed using the
standard protocol during bolus administration of intravenous
contrast. Multiplanar CT image reconstructions and MIPs were
obtained to evaluate the vascular anatomy.
CONTRAST:  100mL OMNIPAQUE IOHEXOL 350 MG/ML SOLN

[Series 6: thins for pacs · axial · 0.63mm/px · z∈[+1357,+1574]mm · 18 of 243 slices shown]
[im 13/243  lung]
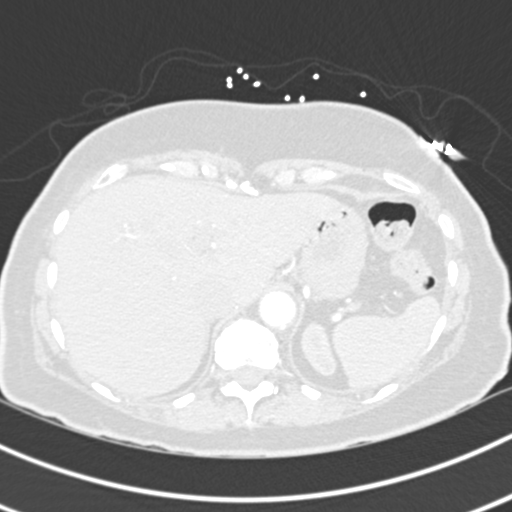
[im 25/243  mediastinal]
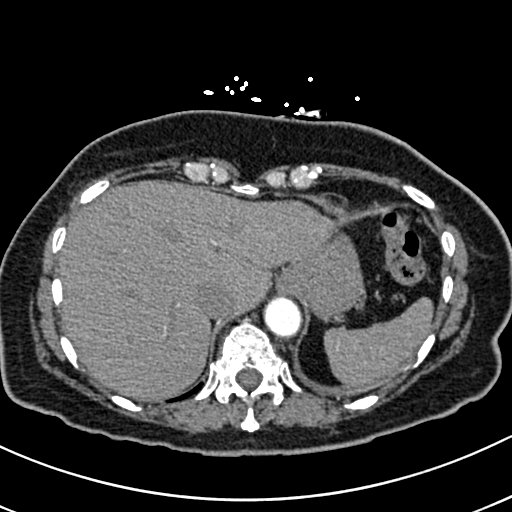
[im 49/243  lung]
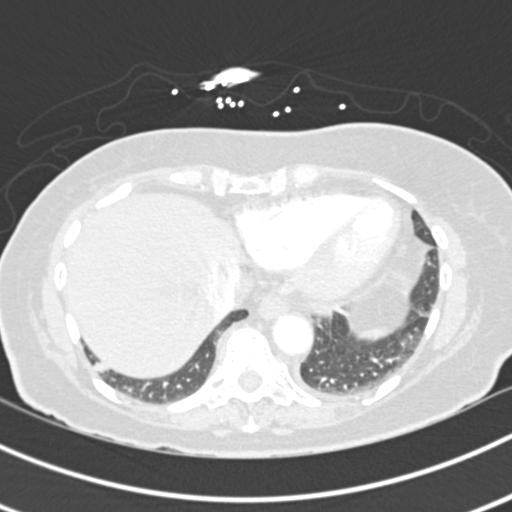
[im 61/243  mediastinal]
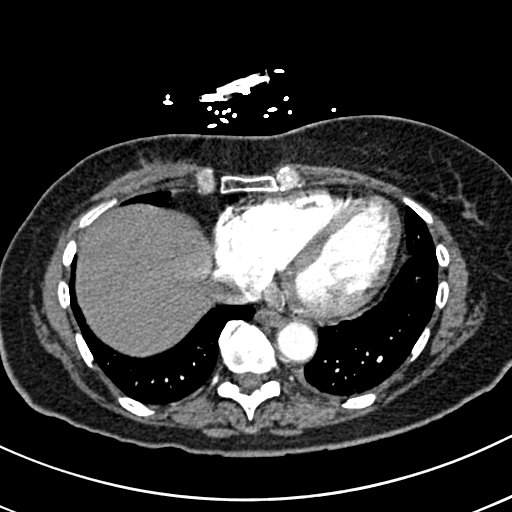
[im 73/243  lung]
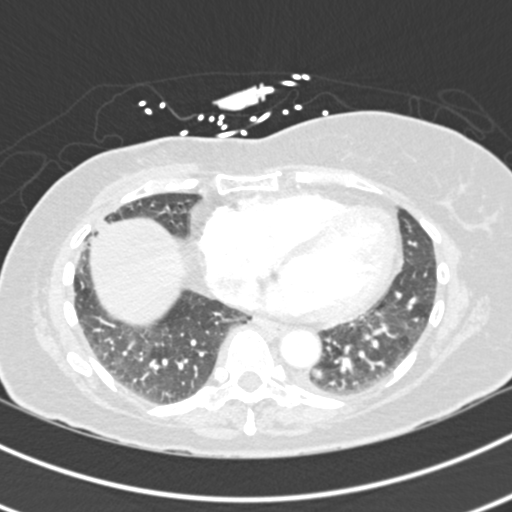
[im 81/243  mediastinal]
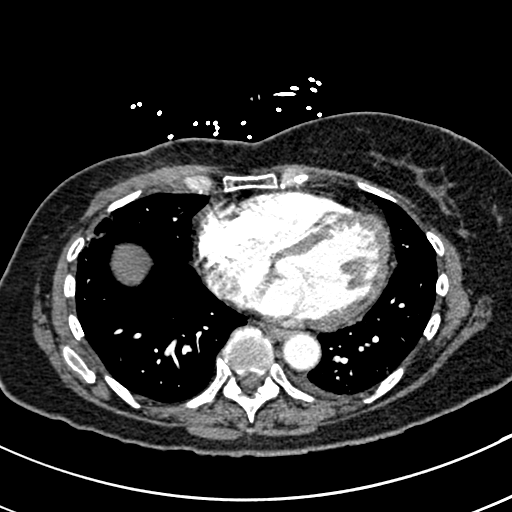
[im 85/243  lung]
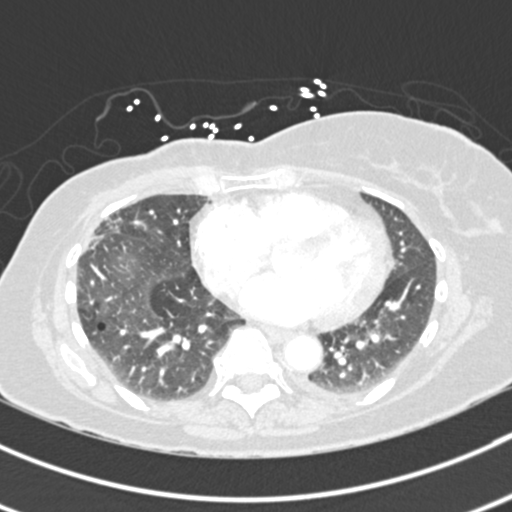
[im 109/243  mediastinal]
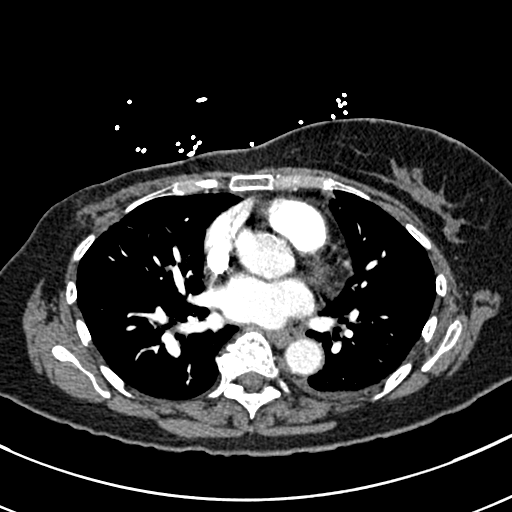
[im 112/243  lung]
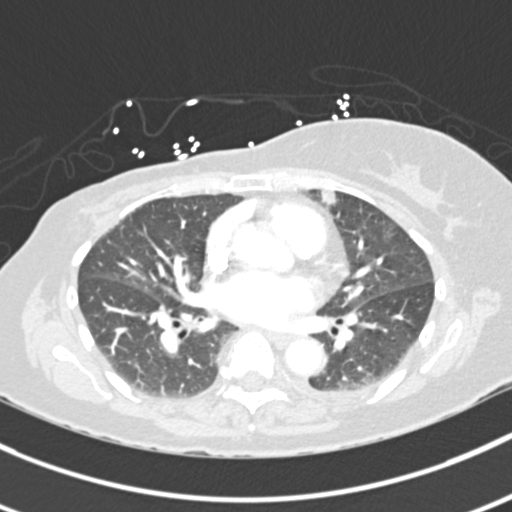
[im 122/243  mediastinal]
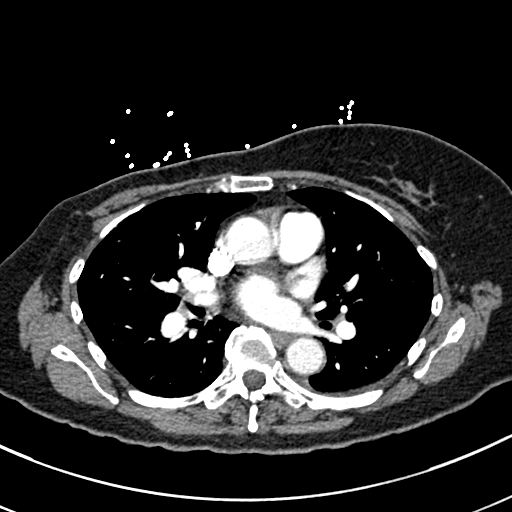
[im 134/243  lung]
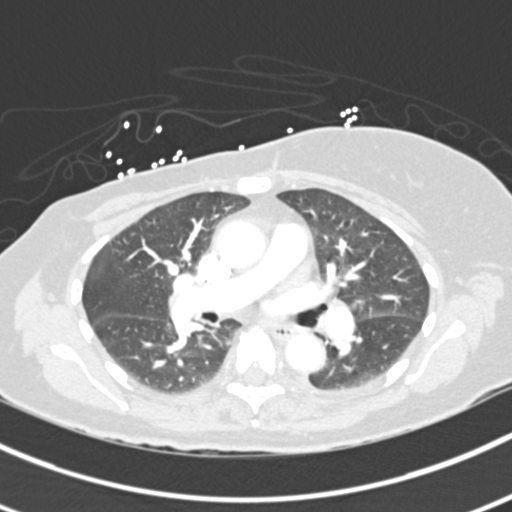
[im 158/243  mediastinal]
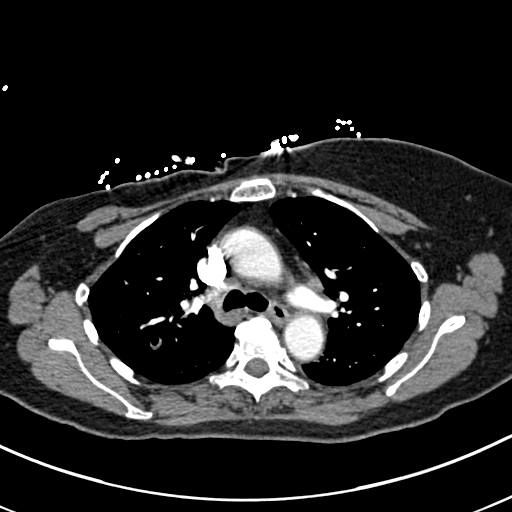
[im 162/243  lung]
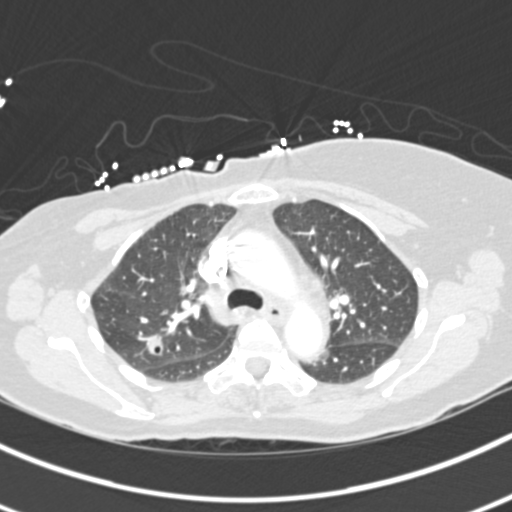
[im 170/243  mediastinal]
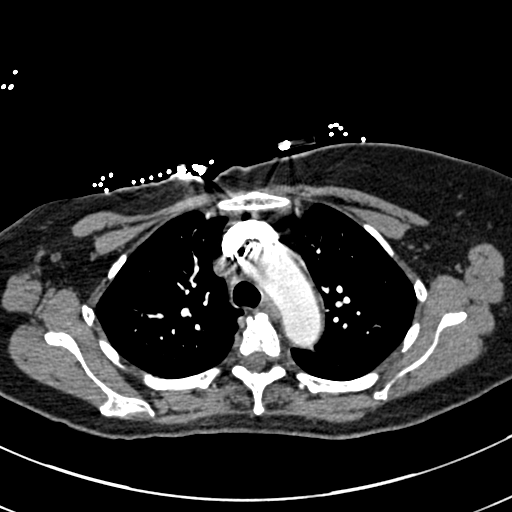
[im 182/243  lung]
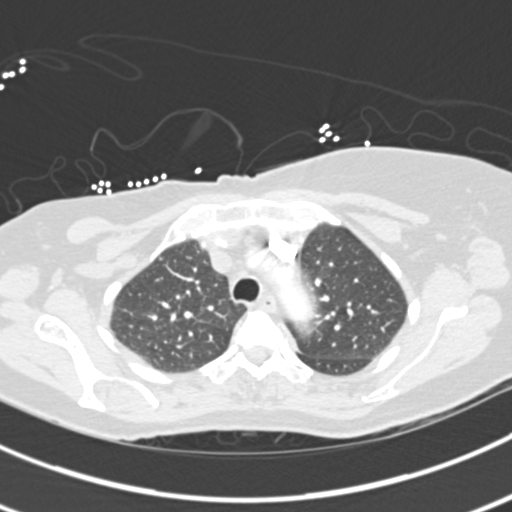
[im 194/243  mediastinal]
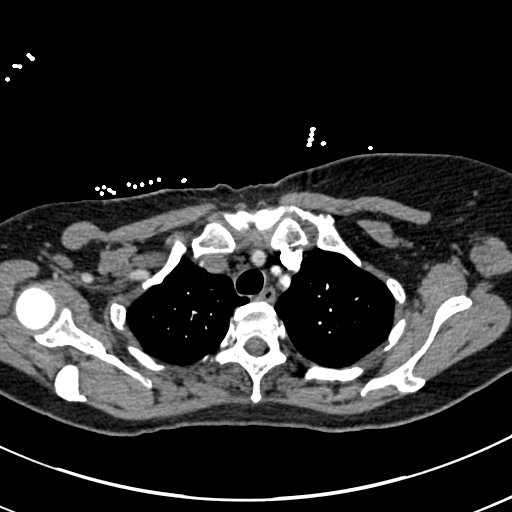
[im 218/243  lung]
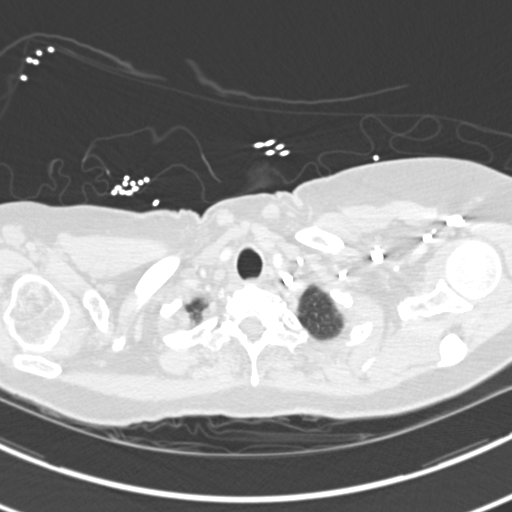
[im 230/243  mediastinal]
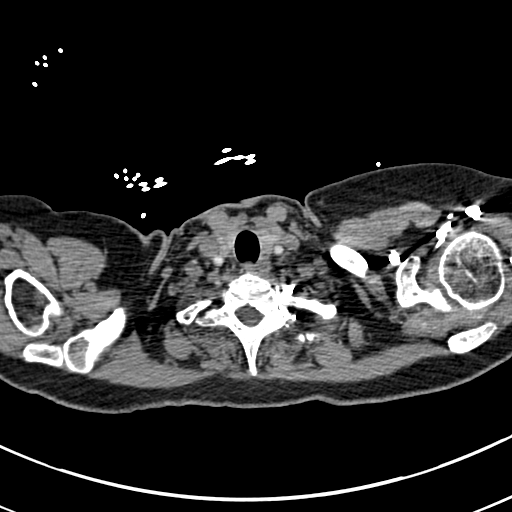

[18 of 32 positions shown; findings below may reference images not displayed]

FINDINGS: There is no demonstrable pulmonary embolus. There is no thoracic
aortic aneurysm or dissection.

On axillary slice 40, series 7, there is a 1.2 x 0.7 cm nodular
opacity with slightly irregular borders in the inferior aspect of
the anterior segment of the right upper lobe. On axial slice 26,
series 7, there is a nodular areas surrounding 80 peripheral
bronchus measuring 1.0 x 0.9 cm. On axial slice 35, there is a 0.6 x
0.6 cm nodular opacity in the superior segment left lower lobe. On
axial slice 59, there is a nodular opacity abutting the pleura in
the posterior segment of the left lower lobe medially measuring
x 1.0 cm. There is a nodular opacity in the lateral segment of the
right lower lobe on slice 66 measuring 0.9 x 0.7 cm. There is mild
subsegmental atelectasis in the lung bases. There is no edema or
consolidation. There is a small left pleural effusion. There are a
few small bullae in the right lower lobe.

There is no appreciable thoracic adenopathy. The pericardium is not
thickened. There is a small hiatal hernia.

Visualized upper abdominal structures appear normal. There are no
blastic or lytic bone lesions. Visualized thyroid appears normal.

Review of the MIP images confirms the above findings.
IMPRESSION: There are several nodular opacities in the lungs as described,
largest in the anterior segment right upper lobe. This appearance
does raise concern for potential metastatic disease. This finding
may warrant nuclear medicine and a PET study or possibly tissue
sampling of one of the nodular lesions to further assess.

No adenopathy. No airspace consolidation. There is a small left
pleural effusion. There is mild underlying emphysematous change.
There is a small hiatal hernia.

No demonstrable pulmonary embolus.

## 2017-02-10 IMAGING — CR DG CHEST 2V
2 series · 2 of 2 positions shown · non-contrast
Comparison: 03/25/2014

CLINICAL DATA: Shortness of breath. Right-sided chest pain.
Metastatic breast carcinoma.

EXAM:
CHEST  2 VIEW

[chest pa]
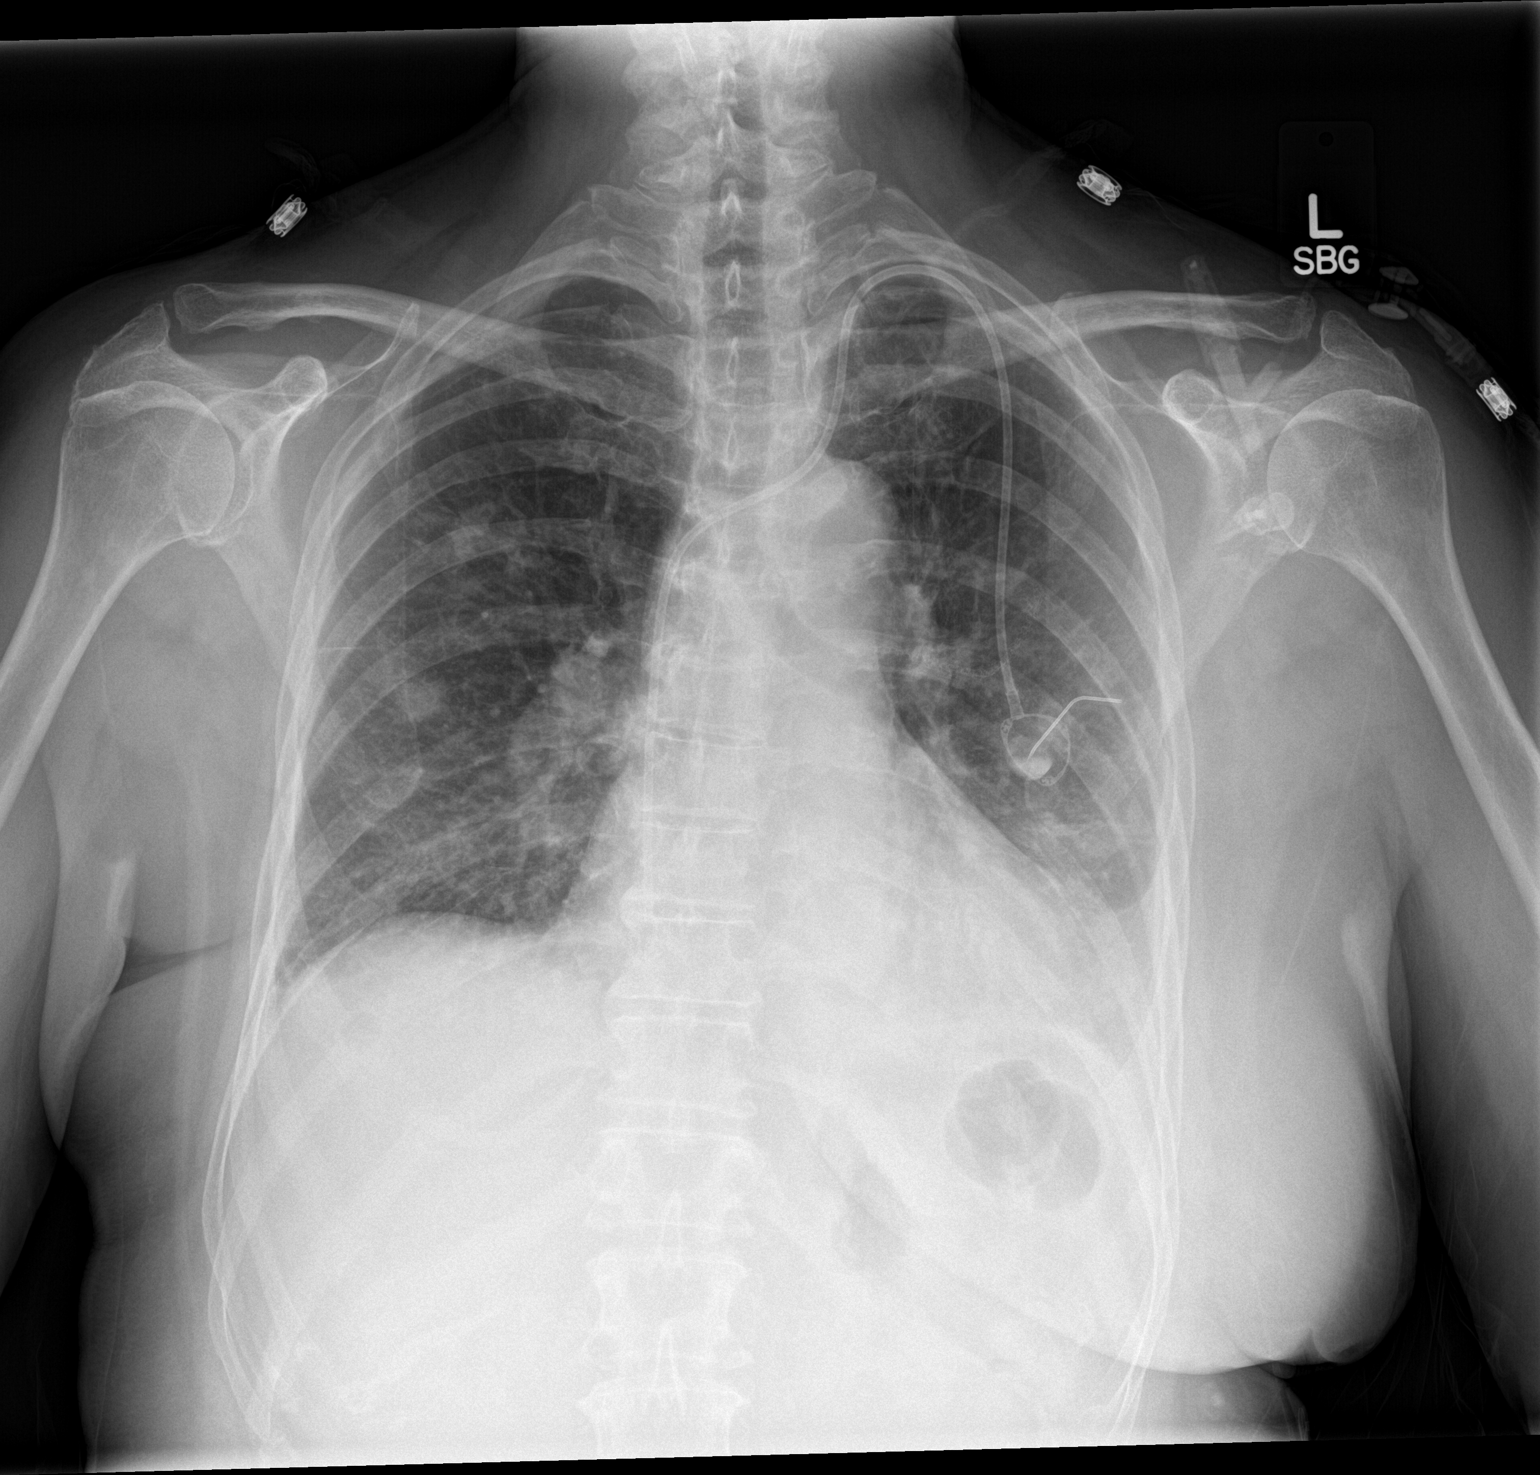

[chest lat]
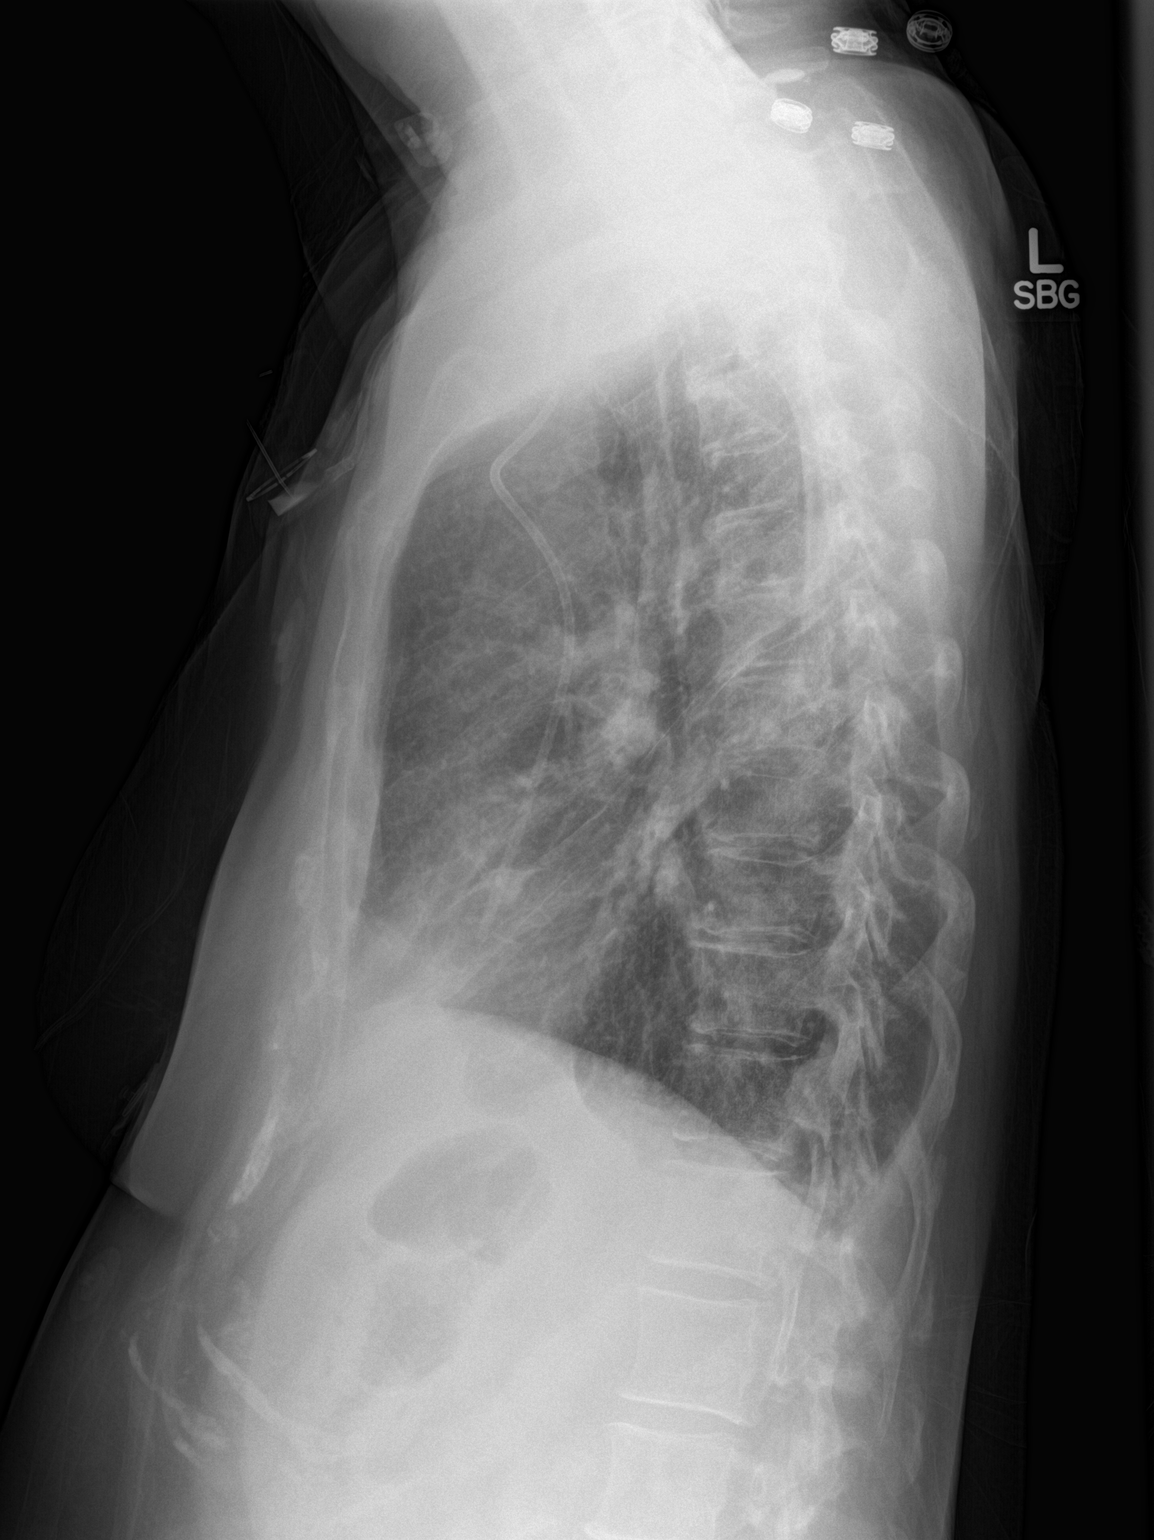

[2 of 2 positions shown; findings below may reference images not displayed]

FINDINGS: Heart size remains within normal limits. Left-sided Port-A-Cath
remains in place. Bilateral pulmonary nodular opacities again seen,
consistent with known pulmonary metastases. No evidence of pulmonary
consolidation. Tiny right pleural effusion again noted posteriorly.
IMPRESSION: Bilateral pulmonary metastases and tiny right pleural effusion again
noted. No acute findings.
